# Patient Record
Sex: Male | Born: 1960 | Race: Black or African American | Hispanic: No | Marital: Married | Smoking: Current every day smoker
Health system: Southern US, Community
[De-identification: ages and names within clinical notes are randomized; demographics above are authoritative.]

## PROBLEM LIST (undated history)

## (undated) DIAGNOSIS — J869 Pyothorax without fistula: Secondary | ICD-10-CM

## (undated) DIAGNOSIS — K219 Gastro-esophageal reflux disease without esophagitis: Secondary | ICD-10-CM

## (undated) HISTORY — PX: NO PAST SURGERIES: SHX2092

## (undated) NOTE — *Deleted (*Deleted)
Shared service with APP.  I have personally seen and examined the patient, providing direct face to face care.  Physical exam findings and plan include patient presents with general weakness, decreased appetite recurrent cough despite being on antibiotics outpatient.  Patient is also had thoughts of self-harm.  On exam patient appears weak, deconditioned, lungs overall clear, abdomen soft nontender.  X-ray shows recurrent scarring type picture, plan for blood work, fluids as needed, CT scan for further delineation.  Sodium returned significantly low and patient will need admission for further work-up. No diagnosis found.

---

## 2016-12-07 ENCOUNTER — Ambulatory Visit (HOSPITAL_COMMUNITY)
Admission: RE | Admit: 2016-12-07 | Discharge: 2016-12-07 | Disposition: A | Discharge status: Home / Self Care | Payer: PRIVATE HEALTH INSURANCE | Source: Ambulatory Visit | Attending: Family Medicine | Admitting: Family Medicine

## 2016-12-07 ENCOUNTER — Other Ambulatory Visit (HOSPITAL_COMMUNITY): Payer: Self-pay | Admitting: Family Medicine

## 2016-12-07 DIAGNOSIS — R042 Hemoptysis: Secondary | ICD-10-CM | POA: Insufficient documentation

## 2016-12-07 DIAGNOSIS — R05 Cough: Secondary | ICD-10-CM | POA: Diagnosis present

## 2016-12-07 DIAGNOSIS — R059 Cough, unspecified: Secondary | ICD-10-CM

## 2016-12-07 DIAGNOSIS — R918 Other nonspecific abnormal finding of lung field: Secondary | ICD-10-CM | POA: Insufficient documentation

## 2017-09-01 ENCOUNTER — Other Ambulatory Visit: Payer: Self-pay | Admitting: Orthopedic Surgery

## 2017-09-03 ENCOUNTER — Other Ambulatory Visit (HOSPITAL_COMMUNITY): Payer: Self-pay | Admitting: Adult Health

## 2017-09-03 DIAGNOSIS — R634 Abnormal weight loss: Secondary | ICD-10-CM

## 2017-09-03 DIAGNOSIS — R5382 Chronic fatigue, unspecified: Secondary | ICD-10-CM

## 2017-09-03 DIAGNOSIS — R042 Hemoptysis: Secondary | ICD-10-CM

## 2017-09-17 ENCOUNTER — Ambulatory Visit (HOSPITAL_COMMUNITY): Payer: PRIVATE HEALTH INSURANCE

## 2017-10-01 ENCOUNTER — Ambulatory Visit (HOSPITAL_COMMUNITY): Admission: RE | Admit: 2017-10-01 | Payer: PRIVATE HEALTH INSURANCE | Source: Ambulatory Visit

## 2017-12-21 ENCOUNTER — Other Ambulatory Visit (HOSPITAL_COMMUNITY): Payer: Self-pay | Admitting: Family Medicine

## 2017-12-21 ENCOUNTER — Ambulatory Visit (HOSPITAL_COMMUNITY)
Admission: RE | Admit: 2017-12-21 | Discharge: 2017-12-21 | Disposition: A | Discharge status: Home / Self Care | Payer: PRIVATE HEALTH INSURANCE | Source: Ambulatory Visit | Attending: Family Medicine | Admitting: Family Medicine

## 2017-12-21 DIAGNOSIS — R918 Other nonspecific abnormal finding of lung field: Secondary | ICD-10-CM | POA: Diagnosis not present

## 2017-12-21 DIAGNOSIS — R042 Hemoptysis: Secondary | ICD-10-CM

## 2017-12-22 ENCOUNTER — Other Ambulatory Visit (HOSPITAL_COMMUNITY): Payer: Self-pay | Admitting: Family Medicine

## 2017-12-22 DIAGNOSIS — R042 Hemoptysis: Secondary | ICD-10-CM

## 2018-01-11 ENCOUNTER — Ambulatory Visit (HOSPITAL_COMMUNITY): Payer: PRIVATE HEALTH INSURANCE

## 2018-01-12 ENCOUNTER — Ambulatory Visit (HOSPITAL_COMMUNITY): Payer: PRIVATE HEALTH INSURANCE

## 2018-03-03 ENCOUNTER — Ambulatory Visit (INDEPENDENT_AMBULATORY_CARE_PROVIDER_SITE_OTHER): Payer: PRIVATE HEALTH INSURANCE | Admitting: Physician Assistant

## 2018-03-03 ENCOUNTER — Ambulatory Visit: Payer: PRIVATE HEALTH INSURANCE | Admitting: Physician Assistant

## 2018-03-03 ENCOUNTER — Encounter: Payer: Self-pay | Admitting: Physician Assistant

## 2018-03-03 ENCOUNTER — Inpatient Hospital Stay (HOSPITAL_COMMUNITY)
Admission: EM | Admit: 2018-03-03 | Discharge: 2018-03-04 | DRG: 178 | Discharge status: Against Medical Advice | Payer: No Typology Code available for payment source | Source: Ambulatory Visit | Attending: Internal Medicine | Admitting: Internal Medicine

## 2018-03-03 ENCOUNTER — Telehealth: Payer: Self-pay

## 2018-03-03 ENCOUNTER — Encounter (HOSPITAL_COMMUNITY): Payer: Self-pay

## 2018-03-03 ENCOUNTER — Other Ambulatory Visit: Payer: Self-pay

## 2018-03-03 ENCOUNTER — Ambulatory Visit
Admission: RE | Admit: 2018-03-03 | Discharge: 2018-03-03 | Disposition: A | Discharge status: Home / Self Care | Payer: PRIVATE HEALTH INSURANCE | Source: Ambulatory Visit | Attending: Physician Assistant | Admitting: Physician Assistant

## 2018-03-03 VITALS — BP 100/62 | HR 87 | Temp 97.8°F | Resp 16 | Ht 68.5 in | Wt 139.0 lb

## 2018-03-03 DIAGNOSIS — E871 Hypo-osmolality and hyponatremia: Secondary | ICD-10-CM | POA: Diagnosis present

## 2018-03-03 DIAGNOSIS — I2510 Atherosclerotic heart disease of native coronary artery without angina pectoris: Secondary | ICD-10-CM | POA: Diagnosis present

## 2018-03-03 DIAGNOSIS — F17210 Nicotine dependence, cigarettes, uncomplicated: Secondary | ICD-10-CM

## 2018-03-03 DIAGNOSIS — Z720 Tobacco use: Secondary | ICD-10-CM

## 2018-03-03 DIAGNOSIS — J869 Pyothorax without fistula: Principal | ICD-10-CM | POA: Diagnosis present

## 2018-03-03 DIAGNOSIS — D75839 Thrombocytosis, unspecified: Secondary | ICD-10-CM

## 2018-03-03 DIAGNOSIS — R9389 Abnormal findings on diagnostic imaging of other specified body structures: Secondary | ICD-10-CM

## 2018-03-03 DIAGNOSIS — R911 Solitary pulmonary nodule: Secondary | ICD-10-CM | POA: Diagnosis present

## 2018-03-03 DIAGNOSIS — R042 Hemoptysis: Secondary | ICD-10-CM | POA: Diagnosis present

## 2018-03-03 DIAGNOSIS — Z820 Family history of epilepsy and other diseases of the nervous system: Secondary | ICD-10-CM

## 2018-03-03 DIAGNOSIS — R634 Abnormal weight loss: Secondary | ICD-10-CM | POA: Diagnosis present

## 2018-03-03 DIAGNOSIS — D473 Essential (hemorrhagic) thrombocythemia: Secondary | ICD-10-CM | POA: Diagnosis present

## 2018-03-03 DIAGNOSIS — I700 Atherosclerosis of aorta: Secondary | ICD-10-CM | POA: Diagnosis present

## 2018-03-03 DIAGNOSIS — Z6820 Body mass index (BMI) 20.0-20.9, adult: Secondary | ICD-10-CM

## 2018-03-03 DIAGNOSIS — J90 Pleural effusion, not elsewhere classified: Secondary | ICD-10-CM | POA: Diagnosis present

## 2018-03-03 HISTORY — DX: Pyothorax without fistula: J86.9

## 2018-03-03 HISTORY — DX: Gastro-esophageal reflux disease without esophagitis: K21.9

## 2018-03-03 LAB — BASIC METABOLIC PANEL
BUN: 9 mg/dL (ref 6–23)
CHLORIDE: 89 meq/L — AB (ref 96–112)
CO2: 29 mEq/L (ref 19–32)
CREATININE: 0.74 mg/dL (ref 0.40–1.50)
Calcium: 8.5 mg/dL (ref 8.4–10.5)
GFR: 140.01 mL/min (ref >60.00)
Glucose, Bld: 95 mg/dL (ref 70–99)
POTASSIUM: 4.7 meq/L (ref 3.5–5.1)
Sodium: 126 mEq/L — ABNORMAL LOW (ref 135–145)

## 2018-03-03 LAB — CBC WITH DIFFERENTIAL/PLATELET
BASOS ABS: 0.2 10*3/uL — AB (ref 0.0–0.1)
Basophils Relative: 1.1 % (ref 0.0–3.0)
EOS ABS: 0.1 10*3/uL (ref 0.0–0.7)
Eosinophils Relative: 0.3 % (ref 0.0–5.0)
HEMATOCRIT: 46.4 % (ref 39.0–52.0)
Hemoglobin: 15.2 g/dL (ref 13.0–17.0)
LYMPHS ABS: 0.7 10*3/uL (ref 0.7–4.0)
LYMPHS PCT: 4.4 % — AB (ref 12.0–46.0)
MCHC: 32.7 g/dL (ref 30.0–36.0)
MCV: 89 fl (ref 78.0–100.0)
Monocytes Absolute: 1.6 10*3/uL — ABNORMAL HIGH (ref 0.1–1.0)
Monocytes Relative: 10.1 % (ref 3.0–12.0)
NEUTROS ABS: 13.6 10*3/uL — AB (ref 1.4–7.7)
NEUTROS PCT: 84.1 % — AB (ref 43.0–77.0)
PLATELETS: 561 10*3/uL — AB (ref 150.0–400.0)
RBC: 5.22 Mil/uL (ref 4.22–5.81)
RDW: 15.2 % (ref 11.5–15.5)
WBC: 16.2 10*3/uL — ABNORMAL HIGH (ref 4.0–10.5)

## 2018-03-03 LAB — I-STAT CG4 LACTIC ACID, ED
Lactic Acid, Venous: 2.07 mmol/L (ref 0.5–1.9)
Lactic Acid, Venous: 2.24 mmol/L (ref 0.5–1.9)

## 2018-03-03 MED ORDER — ENOXAPARIN SODIUM 40 MG/0.4ML ~~LOC~~ SOLN
40.0000 mg | SUBCUTANEOUS | Status: DC
  Administered 2018-03-03: 40 mg via SUBCUTANEOUS
  Filled 2018-03-03: qty 0.4

## 2018-03-03 MED ORDER — VANCOMYCIN HCL 10 G IV SOLR
1250.0000 mg | Freq: Once | INTRAVENOUS | Status: AC
  Administered 2018-03-03: 1250 mg via INTRAVENOUS
  Filled 2018-03-03: qty 1250

## 2018-03-03 MED ORDER — PIPERACILLIN-TAZOBACTAM 3.375 G IVPB 30 MIN
3.3750 g | Freq: Once | INTRAVENOUS | Status: AC
  Administered 2018-03-03: 3.375 g via INTRAVENOUS
  Filled 2018-03-03: qty 50

## 2018-03-03 MED ORDER — SODIUM CHLORIDE 0.9 % IV BOLUS
1000.0000 mL | Freq: Once | INTRAVENOUS | Status: AC
  Administered 2018-03-03: 1000 mL via INTRAVENOUS

## 2018-03-03 MED ORDER — PIPERACILLIN-TAZOBACTAM 3.375 G IVPB
3.3750 g | Freq: Three times a day (TID) | INTRAVENOUS | Status: DC
  Administered 2018-03-03 – 2018-03-04 (×2): 3.375 g via INTRAVENOUS
  Filled 2018-03-03 (×3): qty 50

## 2018-03-03 MED ORDER — IOPAMIDOL (ISOVUE-300) INJECTION 61%
75.0000 mL | Freq: Once | INTRAVENOUS | Status: AC | PRN
  Administered 2018-03-03: 75 mL via INTRAVENOUS

## 2018-03-03 MED ORDER — ENSURE ENLIVE PO LIQD: 237.0000 mL | Freq: Two times a day (BID) | ORAL | Status: DC

## 2018-03-03 NOTE — ED Triage Notes (Signed)
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 Also c/o fatigue.

## 2018-03-03 NOTE — ED Provider Notes (Signed)
Patient placed in Quick Look pathway, seen and evaluated   Chief Complaint: Hemoptysis  HPI:   Patient presents for evaluation of hemoptysis, weight loss, sweats, and cough for the past year.  Patient had a CT of the chest which showed an empyema.  His doctor sent him here for evaluation for TB.  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  He has had associated fatigue.  He smokes a pack a day.  Patient does note that earlier this year, he reports not eating for a month when his significant other was in a coma.  ROS: Hemoptysis, fatigue, weight loss, night sweats, cough  Physical Exam:   Gen: No distress, thin  Neuro: Awake and Alert  Skin: Warm    Focused Exam: Rales noted bilaterally, worse in the right than left; heart normal rate and rhythm, abdomen soft nontender  CBC, BMP completed today prior to arrival.  Lactic added.  Initiation of care has begun. The patient has been counseled on the process, plan, and necessity for staying for the completion/evaluation, and the remainder of the medical screening examination    Frederica Kuster, PA-C 03/03/18 [place], [place], MD 03/05/18 1554

## 2018-03-03 NOTE — ED Notes (Signed)
ED Provider at bedside. 

## 2018-03-03 NOTE — Telephone Encounter (Signed)
Cardiologist would like for the PCP to handle this medication for the patient.

## 2018-03-03 NOTE — Telephone Encounter (Signed)
Avril with [facility] called.STAT CT is complete and in Epic. Spoke with Charlena Cross in the practice and she will let provider know.

## 2018-03-03 NOTE — Progress Notes (Signed)
Patient presents to clinic today to establish care. Patient and wife have significant concern today as they note patient has been dealing with worsening fatigue over the past year associated with weight loss of > 50 pounds and intermittent productive cough with hemoptysis. Denies noted fever. Denies chest pain or SOB. Denies nausea or vomiting. Is a smoker with a > 30 pack-year smoking history. Patient endorses seeing his more recent PCP in July concerning this issue. Was sent for x-ray which we was told looked abnormal. As such he was to have a CT scan performed. States that they went to have done but were unable to do so due to an error in the order. States the imaging department and he and wife had contact PCP regarding this but were having a hard time getting a response. They decided to get a second opinion. Patient notes fatigue is so significant he has a hard time eating sometimes because he is so exhausted. Denies any foreign travel. Denies history of known chronic lung disease.   History reviewed. No pertinent past medical history.  Past Surgical History:  Procedure Laterality Date  . NO PAST SURGERIES      No current outpatient medications on file prior to visit.   No current facility-administered medications on file prior to visit.     No Known Allergies  Family History  Problem Relation Age of Onset  . Alzheimer's disease Mother   . Alzheimer's disease Father     Social History   Socioeconomic History  . Marital status: Married    Spouse name: Not on file  . Number of children: Not on file  . Years of education: Not on file  . Highest education level: Not on file  Occupational History  . Not on file  Social Needs  . Financial resource strain: Not on file  . Food insecurity:    Worry: Not on file    Inability: Not on file  . Transportation needs:    Medical: Not on file    Non-medical: Not on file  Tobacco Use  . Smoking status: Current Every Day Smoker   Packs/day: 1.00    Years: 45.00    Pack years: 45.00    Types: Cigarettes  . Smokeless tobacco: Never Used  Substance and Sexual Activity  . Alcohol use: Not on file  . Drug use: Not on file  . Sexual activity: Yes  Lifestyle  . Physical activity:    Days per week: Not on file    Minutes per session: Not on file  . Stress: Not on file  Relationships  . Social connections:    Talks on phone: Not on file    Gets together: Not on file    Attends religious service: Not on file    Active member of club or organization: Not on file    Attends meetings of clubs or organizations: Not on file    Relationship status: Not on file  . Intimate partner violence:    Fear of current or ex partner: Not on file    Emotionally abused: Not on file    Physically abused: Not on file    Forced sexual activity: Not on file  Other Topics Concern  . Not on file  Social History Narrative  . Not on file    Review of Systems  Constitutional: Positive for chills, malaise/fatigue and weight loss. Negative for fever.  Eyes: Negative for blurred vision and double vision.  Respiratory: Positive for cough, hemoptysis  and sputum production. Negative for shortness of breath and wheezing.   Cardiovascular: Negative for chest pain and palpitations.  Gastrointestinal: Negative.   Genitourinary: Negative.    BP 100/62   Pulse 87   Temp 97.8 F (36.6 C) (Oral)   Resp 16   Ht 5' 8.5" (1.74 m)   Wt 139 lb (63 kg)   SpO2 98%   BMI 20.83 kg/m   Physical Exam  Constitutional: He is oriented to person, place, and time. No distress.  Cachetic appearance  HENT:  Head: Normocephalic and atraumatic.  Right Ear: External ear normal.  Left Ear: External ear normal.  Nose: Nose normal.  Mouth/Throat: Oropharynx is clear and moist. No oropharyngeal exudate.  Eyes: Pupils are equal, round, and reactive to light. Conjunctivae are normal.  Neck: Neck supple.  Cardiovascular: Normal rate, regular rhythm, normal  heart sounds and intact distal pulses.  Pulmonary/Chest: Effort normal and breath sounds normal. No stridor. No respiratory distress. He has no wheezes. He has no rales. He exhibits no tenderness.  Lymphadenopathy:    He has no cervical adenopathy.  Neurological: He is alert and oriented to person, place, and time. No cranial nerve deficit.  Skin: He is not diaphoretic.  Psychiatric: He has a normal mood and affect.    Recent Results (from the past 2160 hour(s))  Basic metabolic panel     Status: Abnormal   Collection Time: 03/03/18 11:07 AM  Result Value Ref Range   Sodium 126 (L) 135 - 145 mEq/L   Potassium 4.7 3.5 - 5.1 mEq/L   Chloride 89 (L) 96 - 112 mEq/L   CO2 29 19 - 32 mEq/L   Glucose, Bld 95 70 - 99 mg/dL   BUN 9 6 - 23 mg/dL   Creatinine, Ser 0.74 0.40 - 1.50 mg/dL   Calcium 8.5 8.4 - 10.5 mg/dL   GFR 140.01 >60.00 mL/min  CBC w/Diff     Status: Abnormal   Collection Time: 03/03/18 11:07 AM  Result Value Ref Range   WBC 16.2 (H) 4.0 - 10.5 K/uL   RBC 5.22 4.22 - 5.81 Mil/uL   Hemoglobin 15.2 13.0 - 17.0 g/dL   HCT 46.4 39.0 - 52.0 %   MCV 89.0 78.0 - 100.0 fl   MCHC 32.7 30.0 - 36.0 g/dL   RDW 15.2 11.5 - 15.5 %   Platelets 561.0 (H) 150.0 - 400.0 K/uL   Neutrophils Relative % 84.1 (H) 43.0 - 77.0 %   Lymphocytes Relative 4.4 (L) 12.0 - 46.0 %   Monocytes Relative 10.1 3.0 - 12.0 %   Eosinophils Relative 0.3 0.0 - 5.0 %   Basophils Relative 1.1 0.0 - 3.0 %   Neutro Abs 13.6 (H) 1.4 - 7.7 K/uL   Lymphs Abs 0.7 0.7 - 4.0 K/uL   Monocytes Absolute 1.6 (H) 0.1 - 1.0 K/uL   Eosinophils Absolute 0.1 0.0 - 0.7 K/uL   Basophils Absolute 0.2 (H) 0.0 - 0.1 K/uL    Assessment/Plan: 1. Hemoptysis 2. Abnormal finding on chest xray In patient with significant smoking history. No known risk factors for TB. Able to review CXR result in Epic from 12/21/17 revealing "significantly increased RUL opacity concerning for pneumonia or malignancy CT chest recommended". Highest  concern is for malignancy versus atypical infection. Will check labs today and order STAT CT Chest giving severity and duration of symptoms and concern for lack of follow-up if not addressed in timely fashion.   - Basic metabolic panel - CBC w/Diff -  CT Chest W Contrast; Future    Leeanne Rio, PA-C

## 2018-03-03 NOTE — ED Provider Notes (Signed)
[place] EMERGENCY DEPARTMENT Provider Note   CSN: [id number] Arrival date & time: 03/03/18  1615     History   Chief Complaint Chief Complaint  Patient presents with  . Hemoptysis    HPI Derrick Beltran is a 57 y.o. male.  57 yo M with a chief complaint of weakness.  This been going on for the past week.  The patient feels very fatigued and rundown and is having trouble getting anything done.  Felt too bad to work this week.  He feels cold but when he touches his body it feels hot.  He denies fevers or chills.  He has been feeling bad for the past year.  He has had intermittent hemoptysis and about a 50 pound weight loss.  He went to a new family physician today and they obtained a chest x-ray that was concerning and so they obtained a CT scan with contrast of the chest that showed a complex fluid collection in the right lung concerning for an empyema.  He was then sent here for evaluation.  The history is provided by the patient and the spouse.  Illness  This is a new problem. The current episode started more than 1 week ago. The problem occurs constantly. The problem has been gradually worsening. Associated symptoms include shortness of breath. Pertinent negatives include no chest pain, no abdominal pain and no headaches. Nothing aggravates the symptoms. Nothing relieves the symptoms. He has tried nothing for the symptoms. The treatment provided no relief.    History reviewed. No pertinent past medical history.  Patient Active Problem List   Diagnosis Date Noted  . Empyema ([place]) 03/03/2018    Past Surgical History:  Procedure Laterality Date  . NO PAST SURGERIES          Home Medications    Prior to Admission medications   Medication Sig Start Date End Date Taking? Authorizing Provider  hydrocortisone cream 1 % Place 1 application rectally as needed for itching.  02/21/18  Yes [provider]  naproxen sodium (ALEVE) 220 MG tablet Take 960 mg  by mouth daily as needed (pain.).    Yes [provider]    Family History Family History  Problem Relation Age of Onset  . Alzheimer's disease Mother   . Alzheimer's disease Father     Social History Social History   Tobacco Use  . Smoking status: Current Every Day Smoker    Packs/day: 1.00    Years: 45.00    Pack years: 45.00    Types: Cigarettes  . Smokeless tobacco: Never Used  Substance Use Topics  . Alcohol use: Yes    Alcohol/week: 30.0 standard drinks    Types: 30 Cans of beer per week    Frequency: Never    Comment: 3-4 / day and more on weekends   . Drug use: Never     Allergies   Patient has no known allergies.   Review of Systems Review of Systems  Constitutional: Negative for chills and fever.  HENT: Negative for congestion and facial swelling.   Eyes: Negative for discharge and visual disturbance.  Respiratory: Positive for cough and shortness of breath.   Cardiovascular: Negative for chest pain and palpitations.  Gastrointestinal: Negative for abdominal pain, diarrhea and vomiting.  Musculoskeletal: Negative for arthralgias and myalgias.  Skin: Negative for color change and rash.  Neurological: Negative for tremors, syncope and headaches.  Psychiatric/Behavioral: Negative for confusion and dysphoric mood.     Physical Exam  Updated Vital Signs BP 109/73   Pulse 92   Temp 98.6 F (37 C) (Oral)   Resp 16   Ht 5\' 8"  (1.727 m)   Wt 61.7 kg   SpO2 97%   BMI 20.68 kg/m   Physical Exam  Constitutional: He is oriented to person, place, and time. He appears well-developed and well-nourished.  Chronically ill appearing  HENT:  Head: Normocephalic and atraumatic.  Eyes: Pupils are equal, round, and reactive to light. EOM are normal.  Neck: Normal range of motion. Neck supple. No JVD present.  Cardiovascular: Normal rate and regular rhythm. Exam reveals no gallop and no friction rub.  No murmur heard. Pulmonary/Chest: No respiratory  distress. He has no wheezes.  Diminished breath sounds on the Right  Abdominal: He exhibits no distension. There is no rebound and no guarding.  Musculoskeletal: Normal range of motion.  Neurological: He is alert and oriented to person, place, and time.  Skin: No rash noted. No pallor.  Psychiatric: He has a normal mood and affect. His behavior is normal.  Nursing note and vitals reviewed.    ED Treatments / Results  Labs (all labs ordered are listed, but only abnormal results are displayed) Labs Reviewed  I-STAT CG4 LACTIC ACID, ED - Abnormal; Notable for the following components:      Result Value   Lactic Acid, Venous 2.07 (*)    All other components within normal limits  CULTURE, BLOOD (ROUTINE X 2)  CULTURE, BLOOD (ROUTINE X 2)  EXPECTORATED SPUTUM ASSESSMENT W REFEX TO RESP CULTURE  I-STAT CG4 LACTIC ACID, ED    EKG None  Radiology Ct Chest W Contrast  Result Date: 03/03/2018 CLINICAL DATA:  57 year old male with history of ill feeling for 1 week. Hemoptysis. Cough. Fevers. Forty-five pack-year history of smoking. EXAM: CT CHEST WITH CONTRAST TECHNIQUE: Multidetector CT imaging of the chest was performed during intravenous contrast administration. CONTRAST:  24mL ISOVUE-300 IOPAMIDOL (ISOVUE-300) INJECTION 61% COMPARISON:  No priors. FINDINGS: Cardiovascular: Heart size is normal. There is no significant pericardial fluid, thickening or pericardial calcification. There is aortic atherosclerosis, as well as atherosclerosis of the great vessels of the mediastinum and the coronary arteries, including calcified atherosclerotic plaque in the left main, left anterior descending, left circumflex and right coronary arteries. Mediastinum/Nodes: Multiple prominent borderline enlarged mediastinal lymph nodes measuring up to 12 mm in short axis in the right paratracheal nodal station. Mildly enlarged subcarinal lymph node measuring 16 mm in short axis. Esophagus is unremarkable in  appearance. No axillary lymphadenopathy. Lungs/Pleura: Large right pleural effusion located predominantly in the sub pulmonic region with extensive pleural thickening and enhancement, suspicious for empyema. Widespread airspace consolidation throughout the right lung, with extensive cylindrical and varicose bronchiectasis, as well as thickening of the peribronchovascular interstitium and chronic volume loss. Several cystic appearing spaces are also noted within the right lung, which may represent areas of cystic bronchiectasis or may represent small cavities. Adjacent to the right lung in the mid to upper right hemithorax there is also a small amount of pleural fluid, extensive pleural thickening and enhancement, and a small amount of gas throughout the pleural space which appears predominantly loculated. In the periphery of the left upper lobe there is a 1.3 x 0.9 cm nodule with macrolobulated margins (axial image 98 of series 3). Left lung is otherwise clear. No left pleural effusion. Upper Abdomen: Aortic atherosclerosis. Musculoskeletal: There are no aggressive appearing lytic or blastic lesions noted in the visualized portions of the skeleton. IMPRESSION: 1.  Extensive chronic infectious changes throughout the right lung with widespread areas of bronchiectasis and post infectious scarring, with large complex right-sided hydropneumothorax which has an appearance most suggestive of a highly loculated empyema. 2. 1.3 x 0.9 cm nodule in the left upper lobe (axial image 98 of series 3). Given the findings in the right lung, this may simply be infectious or inflammatory in etiology, however, close attention on follow-up studies is recommended. Repeat chest CT is recommended in 3 months to further evaluate this lesion. This recommendation follows the consensus statement: Guidelines for Management of Incidental Pulmonary Nodules Detected on CT Images: From the Fleischner Society 2017; Radiology 2017; 284:228-243. 3.  Aortic atherosclerosis, in addition to left main and 3 vessel coronary artery disease. Please note that although the presence of coronary artery calcium documents the presence of coronary artery disease, the severity of this disease and any potential stenosis cannot be assessed on this non-gated CT examination. Assessment for potential risk factor modification, dietary therapy or pharmacologic therapy may be warranted, if clinically indicated. These results will be called to the ordering clinician or representative by the Radiologist Assistant, and communication documented in the PACS or zVision Dashboard. Aortic Atherosclerosis (ICD10-I70.0). Electronically Signed   By: Vinnie Langton M.D.   On: 03/03/2018 14:14    Procedures Procedures (including critical care time)  Medications Ordered in ED Medications  vancomycin (VANCOCIN) 1,250 mg in sodium chloride 0.9 % 250 mL IVPB (has no administration in time range)  piperacillin-tazobactam (ZOSYN) IVPB 3.375 g (3.375 g Intravenous New Bag/Given 03/03/18 1757)     Initial Impression / Assessment and Plan / ED Course  I have reviewed the triage vital signs and the nursing notes.  Pertinent labs & imaging results that were available during my care of the patient were reviewed by me and considered in my medical decision making (see chart for details).     57 yo M with a chief complaint of hemoptysis.  This been going on for the past year with associated weight loss.  Had worsening weakness over the past week.  Describing some fevers but did not ever take his temperature.  The patient had an outpatient CT scan that was concerning for empyema.  I discussed the case with Dr. Roxan Hockey, cardiothoracic surgery.  Recommended medical admission.  The patients results and plan were reviewed and discussed.   Any x-rays performed were independently reviewed by myself.   Differential diagnosis were considered with the presenting HPI.  Medications    vancomycin (VANCOCIN) 1,250 mg in sodium chloride 0.9 % 250 mL IVPB (has no administration in time range)  piperacillin-tazobactam (ZOSYN) IVPB 3.375 g (3.375 g Intravenous New Bag/Given 03/03/18 1757)    Vitals:   03/03/18 1624 03/03/18 1625 03/03/18 1752  BP:  115/76 109/73  Pulse:  87 92  Resp:  18 16  Temp:  98.6 F (37 C)   TempSrc:  Oral   SpO2:  96% 97%  Weight: 61.7 kg    Height: 5\' 8"  (1.727 m)      Final diagnoses:  Empyema lung (HCC)    Admission/ observation were discussed with the admitting physician, patient and/or family and they are comfortable with the plan.    Final Clinical Impressions(s) / ED Diagnoses   Final diagnoses:  Empyema lung [facility])    ED Discharge Orders    None       Deno Etienne, DO 03/03/18 1826

## 2018-03-03 NOTE — Consult Note (Signed)
Reason for Consult:?empyema Referring Physician: ED  Derrick Beltran is an 57 y.o. male.  HPI: 57 yo man with a history of tobacco abuse who presents with a cc/o weakness. Says he has been feeling "rough" for a few months. Over past week progressively more weak. He has been having cough and hemoptysis for the past year. May have had some chills, denies fever. Also 50 pound weight loss over past year. Went to a new doctor today. Had CXR done and was sent to ED. CT with contrast showed complex effusion.  Denies shortness of breath. Some vague chest discomfort.  History reviewed. No pertinent past medical history.  Denies history of TB  Past Surgical History:  Procedure Laterality Date  . NO PAST SURGERIES      Family History  Problem Relation Age of Onset  . Alzheimer's disease Mother   . Alzheimer's disease Father     Social History:  reports that he has been smoking cigarettes. He has a 45.00 pack-year smoking history. He has never used smokeless tobacco. He reports that he drinks about 30.0 standard drinks of alcohol per week. He reports that he does not use drugs.  Allergies: No Known Allergies  Medications: Prior to Admission:  (Not in a hospital admission)  Results for orders placed or performed during the hospital encounter of 03/03/18 (from the past 48 hour(s))  I-Stat CG4 Lactic Acid, ED     Status: Abnormal   Collection Time: 03/03/18  4:43 PM  Result Value Ref Range   Lactic Acid, Venous 2.07 (HH) 0.5 - 1.9 mmol/L   Comment NOTIFIED PHYSICIAN   I-Stat CG4 Lactic Acid, ED     Status: Abnormal   Collection Time: 03/03/18  6:26 PM  Result Value Ref Range   Lactic Acid, Venous 2.24 (HH) 0.5 - 1.9 mmol/L   Comment NOTIFIED PHYSICIAN     Ct Chest W Contrast  Result Date: 03/03/2018 CLINICAL DATA:  57 year old male with history of ill feeling for 1 week. Hemoptysis. Cough. Fevers. Forty-five pack-year history of smoking. EXAM: CT CHEST WITH CONTRAST TECHNIQUE: Multidetector  CT imaging of the chest was performed during intravenous contrast administration. CONTRAST:  57mL ISOVUE-300 IOPAMIDOL (ISOVUE-300) INJECTION 61% COMPARISON:  No priors. FINDINGS: Cardiovascular: Heart size is normal. There is no significant pericardial fluid, thickening or pericardial calcification. There is aortic atherosclerosis, as well as atherosclerosis of the great vessels of the mediastinum and the coronary arteries, including calcified atherosclerotic plaque in the left main, left anterior descending, left circumflex and right coronary arteries. Mediastinum/Nodes: Multiple prominent borderline enlarged mediastinal lymph nodes measuring up to 12 mm in short axis in the right paratracheal nodal station. Mildly enlarged subcarinal lymph node measuring 16 mm in short axis. Esophagus is unremarkable in appearance. No axillary lymphadenopathy. Lungs/Pleura: Large right pleural effusion located predominantly in the sub pulmonic region with extensive pleural thickening and enhancement, suspicious for empyema. Widespread airspace consolidation throughout the right lung, with extensive cylindrical and varicose bronchiectasis, as well as thickening of the peribronchovascular interstitium and chronic volume loss. Several cystic appearing spaces are also noted within the right lung, which may represent areas of cystic bronchiectasis or may represent small cavities. Adjacent to the right lung in the mid to upper right hemithorax there is also a small amount of pleural fluid, extensive pleural thickening and enhancement, and a small amount of gas throughout the pleural space which appears predominantly loculated. In the periphery of the left upper lobe there is a 1.3 x 0.9 cm nodule with  macrolobulated margins (axial image 98 of series 3). Left lung is otherwise clear. No left pleural effusion. Upper Abdomen: Aortic atherosclerosis. Musculoskeletal: There are no aggressive appearing lytic or blastic lesions noted in the  visualized portions of the skeleton. IMPRESSION: 1. Extensive chronic infectious changes throughout the right lung with widespread areas of bronchiectasis and post infectious scarring, with large complex right-sided hydropneumothorax which has an appearance most suggestive of a highly loculated empyema. 2. 1.3 x 0.9 cm nodule in the left upper lobe (axial image 98 of series 3). Given the findings in the right lung, this may simply be infectious or inflammatory in etiology, however, close attention on follow-up studies is recommended. Repeat chest CT is recommended in 3 months to further evaluate this lesion. This recommendation follows the consensus statement: Guidelines for Management of Incidental Pulmonary Nodules Detected on CT Images: From the Fleischner Society 2017; Radiology 2017; 284:228-243. 3. Aortic atherosclerosis, in addition to left main and 3 vessel coronary artery disease. Please note that although the presence of coronary artery calcium documents the presence of coronary artery disease, the severity of this disease and any potential stenosis cannot be assessed on this non-gated CT examination. Assessment for potential risk factor modification, dietary therapy or pharmacologic therapy may be warranted, if clinically indicated. These results will be called to the ordering clinician or representative by the Radiologist Assistant, and communication documented in the PACS or zVision Dashboard. Aortic Atherosclerosis (ICD10-I70.0). Electronically Signed   By: Vinnie Langton M.D.   On: 03/03/2018 14:14    Review of Systems  Constitutional: Positive for chills, diaphoresis, malaise/fatigue and weight loss. Negative for fever.  Respiratory: Positive for cough and hemoptysis. Negative for shortness of breath and wheezing.   Cardiovascular: Negative for chest pain and orthopnea.  Gastrointestinal: Negative for nausea and vomiting.   Blood pressure 109/73, pulse 92, temperature 98.6 F (37 C),  temperature source Oral, resp. rate 16, height 5\' 8"  (1.727 m), weight 61.7 kg, SpO2 97 %. Physical Exam  Vitals reviewed. Constitutional: He is oriented to person, place, and time. No distress.  57 yo man disheveled appearance  HENT:  Head: Normocephalic and atraumatic.  Mouth/Throat: No oropharyngeal exudate.  Eyes: EOM are normal. No scleral icterus.  Neck: No thyromegaly present.  Respiratory: Effort normal. No respiratory distress. He has no wheezes. He has no rales.  Absent BS on right  GI: Soft. He exhibits no distension. There is no tenderness.  Musculoskeletal: He exhibits no edema.  Lymphadenopathy:    He has no cervical adenopathy.  Neurological: He is alert and oriented to person, place, and time. No cranial nerve deficit. He exhibits normal muscle tone.  Skin: Skin is warm and dry. He is not diaphoretic.    Assessment/Plan: 23 yo man who presents with a 1 year history of cough, hemoptysis, and weight loss and now a 1 week history of feeling profoundly weak.  Ct chest shows a destroyed upper lobe, which was present on CXR over a year ago and again in July of this year. There is a new basilar pleural effusion. Appearance of upper lobe with bronchiectasis and essentially a destroyed lobe is worrisome for TB or fungal process.  I think the first step is to access the basilar effusion and drain it. Fluid can be sent for chemistries, cytology and cultures.  I recommend getting IR to place a catheter and consult Pulmonary for thrombolytics. They may also want to do a bronchoscopy at some point.  Will follow  Melrose Nakayama  03/03/2018, 6:38 PM

## 2018-03-03 NOTE — H&P (Signed)
History and Physical    Derrick Beltran KWI:[id number] DOB: [birth year] DOA: 03/03/2018  PCP: [name], PA-C Patient coming from: [place] office  Chief Complaint: Lethargy, hemoptysis, weight loss  HPI: Derrick Beltran is a 57 y.o. male with medical history significant of tobacco use being sent to the hospital by his doctor's office for further evaluation after he had an abnormal chest x-ray.  Patient states he has been feeling very lethargic for the past 8 to 10 days.  Denies having any fevers or chills.  States he has been having hemoptysis and has lost 100 pounds over the past 1 year.  States his appetite is low.  He works as a Games developer.  Denies recent travel or exposure to anyone with TB.  ED Course: Vitals stable on arrival.  Labs showing sodium 126, white count 16.2, and lactic acid 2.0 > 2.2.  Chest CT showing highly loculated empyema in the right lung and 1.3 x 0.9 cm nodule in the left upper lobe (infectious versus inflammatory).  Vancomycin and Zosyn given in the ED.  Patient was seen by Dr. Roxan Hockey (Cardiothoracic surgery) in the ED.  [facility] paged to admit.  Review of Systems: As per HPI otherwise 10 point review of systems negative.  Past Medical History:  Diagnosis Date  . Empyema ([place]) 03/03/2018  . GERD (gastroesophageal reflux disease)     Past Surgical History:  Procedure Laterality Date  . NO PAST SURGERIES       reports that he has been smoking cigarettes. He has a 45.00 pack-year smoking history. He has never used smokeless tobacco. He reports that he drinks about 30.0 standard drinks of alcohol per week. He reports that he does not use drugs.  No Known Allergies  Family History  Problem Relation Age of Onset  . Alzheimer's disease Mother   . Alzheimer's disease Father     Prior to Admission medications   Medication Sig Start Date End Date Taking? Authorizing Provider  hydrocortisone cream 1 % Place 1 application rectally as needed for itching.   02/21/18  Yes [provider]  naproxen sodium (ALEVE) 220 MG tablet Take 960 mg by mouth daily as needed (pain.).    Yes [provider]    Physical Exam: Vitals:   03/03/18 1830 03/03/18 1900 03/03/18 1930 03/03/18 2031  BP: 104/67 105/69 (!) 101/59 114/80  Pulse: 90 82 84 80  Resp:  (!) 21 (!) 21 (!) 25  Temp:    99.7 F (37.6 C)  TempSrc:    Oral  SpO2: 97% 96% 96% 97%  Weight:    62.5 kg  Height:    5\' 9"  (1.753 m)   Physical Exam  Constitutional: He is oriented to person, place, and time. No distress.  HENT:  Head: Normocephalic and atraumatic.  Eyes: Right eye exhibits no discharge. Left eye exhibits no discharge.  Neck: Neck supple. No tracheal deviation present.  Cardiovascular: Normal rate, regular rhythm and intact distal pulses.  Pulmonary/Chest: Effort normal and breath sounds normal. No respiratory distress. He has no wheezes. He has no rales.  Abdominal: Soft. Bowel sounds are normal. He exhibits no distension. There is no tenderness.  Musculoskeletal: He exhibits no edema.  Neurological: He is alert and oriented to person, place, and time.  Skin: Skin is warm and dry. He is not diaphoretic.  Psychiatric: His behavior is normal.     Labs on Admission: I have personally reviewed following labs and imaging studies  CBC: Recent Labs  Lab 03/03/18 1107  WBC 16.2*  NEUTROABS 13.6*  HGB 15.2  HCT 46.4  MCV 89.0  PLT 161.0*   Basic Metabolic Panel: Recent Labs  Lab 03/03/18 1107  NA 126*  K 4.7  CL 89*  CO2 29  GLUCOSE 95  BUN 9  CREATININE 0.74  CALCIUM 8.5   GFR: Estimated Creatinine Clearance: 90.1 mL/min (by C-G formula based on SCr of 0.74 mg/dL). Liver Function Tests: No results for input(s): AST, ALT, ALKPHOS, BILITOT, PROT, ALBUMIN in the last 168 hours. No results for input(s): LIPASE, AMYLASE in the last 168 hours. No results for input(s): AMMONIA in the last 168 hours. Coagulation Profile: No results for input(s):  INR, PROTIME in the last 168 hours. Cardiac Enzymes: No results for input(s): CKTOTAL, CKMB, CKMBINDEX, TROPONINI in the last 168 hours. BNP (last 3 results) No results for input(s): PROBNP in the last 8760 hours. HbA1C: No results for input(s): HGBA1C in the last 72 hours. CBG: No results for input(s): GLUCAP in the last 168 hours. Lipid Profile: No results for input(s): CHOL, HDL, LDLCALC, TRIG, CHOLHDL, LDLDIRECT in the last 72 hours. Thyroid Function Tests: No results for input(s): TSH, T4TOTAL, FREET4, T3FREE, THYROIDAB in the last 72 hours. Anemia Panel: No results for input(s): VITAMINB12, FOLATE, FERRITIN, TIBC, IRON, RETICCTPCT in the last 72 hours. Urine analysis: No results found for: COLORURINE, APPEARANCEUR, LABSPEC, [place], GLUCOSEU, HGBUR, BILIRUBINUR, KETONESUR, PROTEINUR, UROBILINOGEN, NITRITE, LEUKOCYTESUR  Radiological Exams on Admission: Ct Chest W Contrast  Result Date: 03/03/2018 CLINICAL DATA:  57 year old male with history of ill feeling for 1 week. Hemoptysis. Cough. Fevers. Forty-five pack-year history of smoking. EXAM: CT CHEST WITH CONTRAST TECHNIQUE: Multidetector CT imaging of the chest was performed during intravenous contrast administration. CONTRAST:  46mL ISOVUE-300 IOPAMIDOL (ISOVUE-300) INJECTION 61% COMPARISON:  No priors. FINDINGS: Cardiovascular: Heart size is normal. There is no significant pericardial fluid, thickening or pericardial calcification. There is aortic atherosclerosis, as well as atherosclerosis of the great vessels of the mediastinum and the coronary arteries, including calcified atherosclerotic plaque in the left main, left anterior descending, left circumflex and right coronary arteries. Mediastinum/Nodes: Multiple prominent borderline enlarged mediastinal lymph nodes measuring up to 12 mm in short axis in the right paratracheal nodal station. Mildly enlarged subcarinal lymph node measuring 16 mm in short axis. Esophagus is unremarkable  in appearance. No axillary lymphadenopathy. Lungs/Pleura: Large right pleural effusion located predominantly in the sub pulmonic region with extensive pleural thickening and enhancement, suspicious for empyema. Widespread airspace consolidation throughout the right lung, with extensive cylindrical and varicose bronchiectasis, as well as thickening of the peribronchovascular interstitium and chronic volume loss. Several cystic appearing spaces are also noted within the right lung, which may represent areas of cystic bronchiectasis or may represent small cavities. Adjacent to the right lung in the mid to upper right hemithorax there is also a small amount of pleural fluid, extensive pleural thickening and enhancement, and a small amount of gas throughout the pleural space which appears predominantly loculated. In the periphery of the left upper lobe there is a 1.3 x 0.9 cm nodule with macrolobulated margins (axial image 98 of series 3). Left lung is otherwise clear. No left pleural effusion. Upper Abdomen: Aortic atherosclerosis. Musculoskeletal: There are no aggressive appearing lytic or blastic lesions noted in the visualized portions of the skeleton. IMPRESSION: 1. Extensive chronic infectious changes throughout the right lung with widespread areas of bronchiectasis and post infectious scarring, with large complex right-sided hydropneumothorax which has an appearance most suggestive of  a highly loculated empyema. 2. 1.3 x 0.9 cm nodule in the left upper lobe (axial image 98 of series 3). Given the findings in the right lung, this may simply be infectious or inflammatory in etiology, however, close attention on follow-up studies is recommended. Repeat chest CT is recommended in 3 months to further evaluate this lesion. This recommendation follows the consensus statement: Guidelines for Management of Incidental Pulmonary Nodules Detected on CT Images: From the Fleischner Society 2017; Radiology 2017; 284:228-243. 3.  Aortic atherosclerosis, in addition to left main and 3 vessel coronary artery disease. Please note that although the presence of coronary artery calcium documents the presence of coronary artery disease, the severity of this disease and any potential stenosis cannot be assessed on this non-gated CT examination. Assessment for potential risk factor modification, dietary therapy or pharmacologic therapy may be warranted, if clinically indicated. These results will be called to the ordering clinician or representative by the Radiologist Assistant, and communication documented in the PACS or zVision Dashboard. Aortic Atherosclerosis (ICD10-I70.0). Electronically Signed   By: Vinnie Langton M.D.   On: 03/03/2018 14:14    Assessment/Plan Principal Problem:   Empyema (HCC) Active Problems:   Thrombocytosis (HCC)   Hyponatremia   Pulmonary nodule   Aortic atherosclerosis (HCC)   Tobacco use   Empyema -1 year history of cough, hemoptysis, weight loss.  1 week history of severe fatigue. -Hemodynamically stable.  Labs showing white count 16.2. Lactic acid 2.0 > 2.2 > 0.5 with IV fluid. -CT showing highly loculated empyema in the right lung. -Patient was seen by Dr. Roxan Hockey from cardiothoracic surgery and his presentation is thought to be worrisome for TB versus fungal process.  He is recommending drainage of the basilar infusion by IR and consulting pulmonology for thrombolytics and bronchoscopy. -I spoke to Dr. Vernard Gambles, IR, will see the patient in the morning.  -Please consult pulmonology in the morning for thrombolytics after patient gets catheter placed by IR. -IV Zosyn -Sputum culture -Blood culture x2 -Airborne isolation, check QuantiFERON gold -Trend lactate -N.p.o. after midnight  Thrombocytosis Platelets 561,000.  No prior baseline in chart. -Repeat CBC in a.m.  Hyponatremia Sodium 126. Unclear if chronic as no prior labs in chart.  Patient received fluid bolus for elevated  lactate. -Recheck BMP in a.m.  Pulmonary nodule -CT showing 1.3 x 0.9 cm nodule in the left upper lobe (infectious versus inflammatory) -Patient will need a follow-up CT in 3 months  Aortic atherosclerosis -CT showing left main and three-vessel coronary artery disease. -Risk factor stratification: Discussed smoking cessation.  Check lipid panel and A1c. -He will need outpatient ischemic work-up  Tobacco use -Discussed smoking cessation  DVT prophylaxis: Lovenox Code Status: Patient wishes to be full code. Family Communication: No family present at bedside. Disposition Plan: Anticipate discharge to home in 2 to 3 days.  Consults called: Cardiothoracic surgery (Dr. Roxan Hockey), interventional radiology (Dr. Vernard Gambles) Admission status: Inpatient   Shela Leff MD [facility] Pager [phone]  If 7PM-7AM, please contact night-coverage [email or web address] Password [email or web address]  03/04/2018, 1:36 AM

## 2018-03-03 NOTE — Patient Instructions (Addendum)
Please go to the lab today for blood work.  I will call you with your results. We will alter treatment regimen(s) if indicated by your results.   Speak to Levada Dy up front who is going to schedule you for a CT scan ASAP.  Tylenol for pain. Can apply Salon Pas patches to the area to help with discomfort.   We will know more shortly and we can alter treatment regimen.

## 2018-03-04 DIAGNOSIS — E871 Hypo-osmolality and hyponatremia: Secondary | ICD-10-CM

## 2018-03-04 DIAGNOSIS — I700 Atherosclerosis of aorta: Secondary | ICD-10-CM

## 2018-03-04 DIAGNOSIS — Z720 Tobacco use: Secondary | ICD-10-CM

## 2018-03-04 DIAGNOSIS — D473 Essential (hemorrhagic) thrombocythemia: Secondary | ICD-10-CM

## 2018-03-04 DIAGNOSIS — F17210 Nicotine dependence, cigarettes, uncomplicated: Secondary | ICD-10-CM

## 2018-03-04 DIAGNOSIS — D75839 Thrombocytosis, unspecified: Secondary | ICD-10-CM

## 2018-03-04 DIAGNOSIS — R911 Solitary pulmonary nodule: Secondary | ICD-10-CM

## 2018-03-04 LAB — HIV ANTIBODY (ROUTINE TESTING W REFLEX): HIV SCREEN 4TH GENERATION: NONREACTIVE

## 2018-03-04 LAB — BASIC METABOLIC PANEL
ANION GAP: 9 (ref 5–15)
BUN: 5 mg/dL — ABNORMAL LOW (ref 6–20)
CALCIUM: 8.5 mg/dL — AB (ref 8.9–10.3)
CO2: 28 mmol/L (ref 22–32)
CREATININE: 0.83 mg/dL (ref 0.61–1.24)
Chloride: 94 mmol/L — ABNORMAL LOW (ref 98–111)
Glucose, Bld: 87 mg/dL (ref 70–99)
Potassium: 3.9 mmol/L (ref 3.5–5.1)
SODIUM: 131 mmol/L — AB (ref 135–145)

## 2018-03-04 LAB — CBC
HEMATOCRIT: 46.4 % (ref 39.0–52.0)
Hemoglobin: 14 g/dL (ref 13.0–17.0)
MCH: 27.2 pg (ref 26.0–34.0)
MCHC: 30.2 g/dL (ref 30.0–36.0)
MCV: 90.1 fL (ref 80.0–100.0)
NRBC: 0 % (ref 0.0–0.2)
PLATELETS: 551 10*3/uL — AB (ref 150–400)
RBC: 5.15 MIL/uL (ref 4.22–5.81)
RDW: 13.9 % (ref 11.5–15.5)
WBC: 13.4 10*3/uL — AB (ref 4.0–10.5)

## 2018-03-04 LAB — LIPID PANEL
Cholesterol: 87 mg/dL (ref 0–200)
HDL: 22 mg/dL — ABNORMAL LOW (ref >40)
LDL CALC: 51 mg/dL (ref 0–99)
Total CHOL/HDL Ratio: 4 RATIO
Triglycerides: 72 mg/dL (ref <150)
VLDL: 14 mg/dL (ref 0–40)

## 2018-03-04 LAB — PROTIME-INR
INR: 1.28
Prothrombin Time: 15.9 seconds — ABNORMAL HIGH (ref 11.4–15.2)

## 2018-03-04 LAB — LACTIC ACID, PLASMA: LACTIC ACID, VENOUS: 0.5 mmol/L (ref 0.5–1.9)

## 2018-03-04 LAB — HEMOGLOBIN A1C
HEMOGLOBIN A1C: 4.9 % (ref 4.8–5.6)
Mean Plasma Glucose: 93.93 mg/dL

## 2018-03-04 MED ORDER — ENOXAPARIN SODIUM 40 MG/0.4ML ~~LOC~~ SOLN: 40.0000 mg | SUBCUTANEOUS | Status: DC

## 2018-03-04 NOTE — Discharge Summary (Addendum)
[facility] TEAM 1 - Stepdown/ICU TEAM  PT LEFT AMA SUMMARY  Lorcan Shelp MRN - [id number] DOB - Jul 11, 1960  Date of Admission - 03/03/2018 Date LEFT AMA: 03/04/2018  Attending Physician:  Joette Catching T  Patient's PCP:  [name], PA-C  Disposition: LEFT AMA  Follow-up Appts:  Not able to be arranged or discussed as pt LEFT AMA  Diagnoses at time pt LEFT AMA: R lung upper lobe destructive process - Empyema / complex effusion - worrisome for TB or fungal process  Thrombocytosis  Hyponatremia  Pulmonary nodule Aortic atherosclerosis Tobacco use  Initial presentation: 57 y.o.malewith a hx of tobacco use who was sent to the hospital by his doctor's office for an abnormal chest x-ray.He had been feeling lethargic for 8 to 10 days but denied fevers or chills. ROS revealed intermittent hemoptysis and a 100 pound weight loss over 1 year. He denied recent travel or exposure to anyone with TB. He works as a Games developer.   In the ED Chest CT showed highly loculated empyema in the right lung and1.3 x 0.9 cm nodule in the left upper lobe (infectious versus inflammatory).  Hospital Course: The patient was admitted to the acute hospital after he presented with the above listed symptoms.  As noted above work-up revealed a profound process in the right lung.  The exact nature of this process was not clear.  The differential included tuberculosis, fungal infection, atypical profound bacterial infection, or even malignant process.  The patient was seen in consultation by T[facility]. The initial step of the evaluation was to include placement of a pigtail catheter to allow drainage of pleural fluid for further testing.  It was appreciated that further work-up would likely be required.  Before the pleural drain could be placed the patient informed his nurse that he wished to leave AMA.  The dictating MD presented to the room and had an extensive discussion with the patient and his wife.  I  showed the patient the images of his CT scan, which were profoundly abnormal.  He appeared to understand how severe the process was in his right lung.  I explained to him our concern that without treatment that this process will almost assuredly worsen and could involve his left lung and would then lead to his untimely death.  He persisted in his desire to leave.  I offered to simplify the process and obtain simply a thoracentesis without a catheter remaining but this did not sway him and he continued to insist that he be allowed to leave the hospital.  I explained to him that I was hopeful that he had a treatable condition and that I felt that his decision to leave the hospital would lead to his unnecessary early death.  He voiced understanding but again insisted that he was leaving no matter the outcome.  His wife pleaded with him to no avail.  Given the concern that the patient's pulmonary process could represent TB I called and spoke with the on-call ID Dr. for the day.  He suggested that I contact the health department for further recommendations.  I called the health department and received a voicemail explaining to me that Ms. Tammy Faucette, apparently the TB nurse, was not available until next week.  I called again and attempted to reach the TB nurse on call but repeatedly was forwarded to medical records voicemail.  I will make other attempts to contact the health department for further advice.    Medication List  Unable to be finalized as pt LEFT AMA  Day of Discharge Wt Readings from Last 3 Encounters:  03/03/18 62.5 kg  03/03/18 63 kg   Temp Readings from Last 3 Encounters:  03/04/18 98 F (36.7 C) (Oral)  03/03/18 97.8 F (36.6 C) (Oral)   BP Readings from Last 3 Encounters:  03/04/18 (!) 105/59  03/03/18 100/62   Pulse Readings from Last 3 Encounters:  03/04/18 61  03/03/18 87    Physical Exam: Exam not able to be completed at time of d/c as pt LEFT AMA  12:21  PM 03/04/18  Cherene Altes, MD [facility] Office  [phone] Pager [phone]  On-Call/Text Page:      [email or web address]      password [facility]

## 2018-03-04 NOTE — Progress Notes (Signed)
Patient left AMA. Patient was upset about getting a chest tube and he wanted to go outside and smoke a cigarette. I explained to [name] that we could not allow him to go outside and smoke a cigarette. I told him that he was on airborne precautions until we could rule out Tuberculosis which is a serious condition. He stated that he would not be told that he could not smoke a cigarette. He also stated that he would not be stuck in the hospital with a chest tube. Contacted Dr. Thereasa Solo and asked him to speak with patient. Dr. Thereasa Solo came to unit and spoke to patient. Patient requested that I remove his IV, and that he wanted to sign AMA papers and leave hospital. Patient's wife was in the room with him and she encouraged patient to stay and get treatment. Patient told his wife that he was not staying and he was signing [place] papers.  Patient signed AMA papers and they were placed in the chart. I removed patient's IV. Patient got dressed and walked out of the hospital with his wife.

## 2018-03-04 NOTE — H&P (Signed)
Chief Complaint: Right empyema  Referring Physician(s): J. Minburn Physician: Markus Daft  Patient Status: [facility] - In-pt  History of Present Illness: Derrick Beltran is a 57 y.o. male who presented to the ED yesterday c/o weakness and fatigue.  He reports feeling poorly overall for the past year.  He also reports intermittent hemoptysis and a 50 pound weight loss.  CT scan of the chest revealed a complex fluid collection in the right lung concerning for empyema.  WBC on admission was 16.2.  He is NPO. He had Lovenox 40 mg at 2230 last night.  He is very grumpy and states "I don't like doctors doing strange things to me.... I might just get up and leave".  Past Medical History:  Diagnosis Date  . Empyema ([place]) 03/03/2018  . GERD (gastroesophageal reflux disease)     Past Surgical History:  Procedure Laterality Date  . NO PAST SURGERIES      Allergies: Patient has no known allergies.  Medications: Prior to Admission medications   Medication Sig Start Date End Date Taking? Authorizing Provider  hydrocortisone cream 1 % Place 1 application rectally as needed for itching.  02/21/18  Yes [provider]  naproxen sodium (ALEVE) 220 MG tablet Take 960 mg by mouth daily as needed (pain.).    Yes [provider]     Family History  Problem Relation Age of Onset  . Alzheimer's disease Mother   . Alzheimer's disease Father     Social History   Socioeconomic History  . Marital status: Married    Spouse name: Not on file  . Number of children: Not on file  . Years of education: Not on file  . Highest education level: Not on file  Occupational History  . Not on file  Social Needs  . Financial resource strain: Not on file  . Food insecurity:    Worry: Not on file    Inability: Not on file  . Transportation needs:    Medical: Not on file    Non-medical: Not on file  Tobacco Use  . Smoking status: Current Every Day Smoker   Packs/day: 1.00    Years: 45.00    Pack years: 45.00    Types: Cigarettes  . Smokeless tobacco: Never Used  Substance and Sexual Activity  . Alcohol use: Yes    Alcohol/week: 30.0 standard drinks    Types: 30 Cans of beer per week    Frequency: Never    Comment: 03/03/2018 "3-4 / day and more on weekends "  . Drug use: Never  . Sexual activity: Not Currently  Lifestyle  . Physical activity:    Days per week: Not on file    Minutes per session: Not on file  . Stress: Not on file  Relationships  . Social connections:    Talks on phone: Not on file    Gets together: Not on file    Attends religious service: Not on file    Active member of club or organization: Not on file    Attends meetings of clubs or organizations: Not on file    Relationship status: Not on file  Other Topics Concern  . Not on file  Social History Narrative  . Not on file     Review of Systems: A 12 point ROS discussed and pertinent positives are indicated in the HPI above.  All other systems are negative.  Review of Systems  Vital Signs: BP (!) 105/59 (BP  Location: Left Arm)   Pulse 61   Temp 98 F (36.7 C) (Oral)   Resp 14   Ht 5\' 9"  (1.753 m)   Wt 62.5 kg   SpO2 100%   BMI 20.33 kg/m   Physical Exam  Constitutional: He is oriented to person, place, and time. He appears well-developed.  HENT:  Head: Normocephalic and atraumatic.  Eyes: EOM are normal.  Neck: Normal range of motion.  Cardiovascular: Normal rate, regular rhythm and normal heart sounds.  Pulmonary/Chest: Effort normal.  Diminished breath sounds on the right.  Abdominal: Soft. He exhibits no distension. There is no tenderness.  Musculoskeletal: Normal range of motion.  Neurological: He is alert and oriented to person, place, and time.  Skin: Skin is warm and dry.  Psychiatric: He has a normal mood and affect. His behavior is normal. Judgment and thought content normal.  Vitals reviewed.   Imaging: Ct Chest W  Contrast  Result Date: 03/03/2018 CLINICAL DATA:  57 year old male with history of ill feeling for 1 week. Hemoptysis. Cough. Fevers. Forty-five pack-year history of smoking. EXAM: CT CHEST WITH CONTRAST TECHNIQUE: Multidetector CT imaging of the chest was performed during intravenous contrast administration. CONTRAST:  63mL ISOVUE-300 IOPAMIDOL (ISOVUE-300) INJECTION 61% COMPARISON:  No priors. FINDINGS: Cardiovascular: Heart size is normal. There is no significant pericardial fluid, thickening or pericardial calcification. There is aortic atherosclerosis, as well as atherosclerosis of the great vessels of the mediastinum and the coronary arteries, including calcified atherosclerotic plaque in the left main, left anterior descending, left circumflex and right coronary arteries. Mediastinum/Nodes: Multiple prominent borderline enlarged mediastinal lymph nodes measuring up to 12 mm in short axis in the right paratracheal nodal station. Mildly enlarged subcarinal lymph node measuring 16 mm in short axis. Esophagus is unremarkable in appearance. No axillary lymphadenopathy. Lungs/Pleura: Large right pleural effusion located predominantly in the sub pulmonic region with extensive pleural thickening and enhancement, suspicious for empyema. Widespread airspace consolidation throughout the right lung, with extensive cylindrical and varicose bronchiectasis, as well as thickening of the peribronchovascular interstitium and chronic volume loss. Several cystic appearing spaces are also noted within the right lung, which may represent areas of cystic bronchiectasis or may represent small cavities. Adjacent to the right lung in the mid to upper right hemithorax there is also a small amount of pleural fluid, extensive pleural thickening and enhancement, and a small amount of gas throughout the pleural space which appears predominantly loculated. In the periphery of the left upper lobe there is a 1.3 x 0.9 cm nodule with  macrolobulated margins (axial image 98 of series 3). Left lung is otherwise clear. No left pleural effusion. Upper Abdomen: Aortic atherosclerosis. Musculoskeletal: There are no aggressive appearing lytic or blastic lesions noted in the visualized portions of the skeleton. IMPRESSION: 1. Extensive chronic infectious changes throughout the right lung with widespread areas of bronchiectasis and post infectious scarring, with large complex right-sided hydropneumothorax which has an appearance most suggestive of a highly loculated empyema. 2. 1.3 x 0.9 cm nodule in the left upper lobe (axial image 98 of series 3). Given the findings in the right lung, this may simply be infectious or inflammatory in etiology, however, close attention on follow-up studies is recommended. Repeat chest CT is recommended in 3 months to further evaluate this lesion. This recommendation follows the consensus statement: Guidelines for Management of Incidental Pulmonary Nodules Detected on CT Images: From the Fleischner Society 2017; Radiology 2017; 284:228-243. 3. Aortic atherosclerosis, in addition to left main and 3 vessel  coronary artery disease. Please note that although the presence of coronary artery calcium documents the presence of coronary artery disease, the severity of this disease and any potential stenosis cannot be assessed on this non-gated CT examination. Assessment for potential risk factor modification, dietary therapy or pharmacologic therapy may be warranted, if clinically indicated. These results will be called to the ordering clinician or representative by the Radiologist Assistant, and communication documented in the PACS or zVision Dashboard. Aortic Atherosclerosis (ICD10-I70.0). Electronically Signed   By: Vinnie Langton M.D.   On: 03/03/2018 14:14    Labs:  CBC: Recent Labs    03/03/18 1107  WBC 16.2*  HGB 15.2  HCT 46.4  PLT 561.0*    COAGS: No results for input(s): INR, APTT in the last 8760  hours.  BMP: Recent Labs    03/03/18 1107  NA 126*  K 4.7  CL 89*  CO2 29  GLUCOSE 95  BUN 9  CALCIUM 8.5  CREATININE 0.74    LIVER FUNCTION TESTS: No results for input(s): BILITOT, AST, ALT, ALKPHOS, PROT, ALBUMIN in the last 8760 hours.  TUMOR MARKERS: No results for input(s): AFPTM, CEA, CA199, CHROMGRNA in the last 8760 hours.  Assessment and Plan:  Complex pleural effusion c/w empyema on the right.  Images reviewed by Dr. Anselm Pancoast.  Will check INR.  Will proceed with image guided placement of a pigtail chest catheter today by Dr. Anselm Pancoast.  Risks and benefits discussed with the patient including bleeding, infection, damage to adjacent structures,  and sepsis.  All of the patient's questions were answered, patient is agreeable to proceed. Consent signed and in chart.  Thank you for this interesting consult.  I greatly enjoyed meeting Derrick Beltran and look forward to participating in their care.  A copy of this report was sent to the requesting provider on this date.  Electronically Signed: Murrell Redden, PA-C   03/04/2018, 8:59 AM      I spent a total of 40 Minutes in face to face in clinical consultation, greater than 50% of which was counseling/coordinating care for pigtail chest tube placement.

## 2018-03-07 ENCOUNTER — Telehealth: Payer: Self-pay | Admitting: Emergency Medicine

## 2018-03-07 LAB — QUANTIFERON-TB GOLD PLUS (RQFGPL)
QUANTIFERON MITOGEN VALUE: 0.19 [IU]/mL
QUANTIFERON NIL VALUE: 0.02 [IU]/mL
QUANTIFERON TB2 AG VALUE: 0.01 [IU]/mL
QuantiFERON TB1 Ag Value: 0.02 IU/mL

## 2018-03-07 LAB — QUANTIFERON-TB GOLD PLUS: QuantiFERON-TB Gold Plus: UNDETERMINED

## 2018-03-07 NOTE — Telephone Encounter (Signed)
Per Discharge Summary, Patient left AMA. I spoke with Dr. Birdie Riddle regarding this patient and we have decided not to complete a TCM, due to patient leaving hospital Against medical advise.   See Discharge summary.   Doloris Hall,  LPN

## 2018-03-08 LAB — CULTURE, BLOOD (ROUTINE X 2)
Culture: NO GROWTH
Culture: NO GROWTH
Special Requests: ADEQUATE

## 2018-03-11 NOTE — Telephone Encounter (Signed)
Late entry from 03/10/18 afternoon- Pt wife (pt with her) called in requesting to come into [place] office for additional TB testing. 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 Pt wife stated he would not go back to the ER and planned on going to work 03/11/18. Advised him that TB is infectious and he should go to the ER. Pts wife said that [facility] Dept sent someone to the house 03/10/18 to draw labs but they had the wrong tube and said they would be back 10/18 or 10/21.

## 2018-03-12 ENCOUNTER — Emergency Department (HOSPITAL_COMMUNITY)
Admission: EM | Admit: 2018-03-12 | Discharge: 2018-03-12 | Discharge status: Against Medical Advice | Payer: No Typology Code available for payment source | Source: Home / Self Care | Attending: Emergency Medicine | Admitting: Emergency Medicine

## 2018-03-12 ENCOUNTER — Encounter (HOSPITAL_COMMUNITY): Payer: Self-pay | Admitting: Emergency Medicine

## 2018-03-12 DIAGNOSIS — F17210 Nicotine dependence, cigarettes, uncomplicated: Secondary | ICD-10-CM | POA: Insufficient documentation

## 2018-03-12 DIAGNOSIS — J90 Pleural effusion, not elsewhere classified: Secondary | ICD-10-CM | POA: Diagnosis not present

## 2018-03-12 DIAGNOSIS — J869 Pyothorax without fistula: Secondary | ICD-10-CM

## 2018-03-12 DIAGNOSIS — Z79899 Other long term (current) drug therapy: Secondary | ICD-10-CM | POA: Insufficient documentation

## 2018-03-12 LAB — COMPREHENSIVE METABOLIC PANEL
ALBUMIN: 1.8 g/dL — AB (ref 3.5–5.0)
ALT: 8 U/L (ref 0–44)
ANION GAP: 9 (ref 5–15)
AST: 16 U/L (ref 15–41)
Alkaline Phosphatase: 65 U/L (ref 38–126)
BUN: 6 mg/dL (ref 6–20)
CO2: 23 mmol/L (ref 22–32)
Calcium: 8.1 mg/dL — ABNORMAL LOW (ref 8.9–10.3)
Chloride: 93 mmol/L — ABNORMAL LOW (ref 98–111)
Creatinine, Ser: 0.76 mg/dL (ref 0.61–1.24)
Glucose, Bld: 162 mg/dL — ABNORMAL HIGH (ref 70–99)
POTASSIUM: 4 mmol/L (ref 3.5–5.1)
Sodium: 125 mmol/L — ABNORMAL LOW (ref 135–145)
Total Bilirubin: 0.7 mg/dL (ref 0.3–1.2)
Total Protein: 8.4 g/dL — ABNORMAL HIGH (ref 6.5–8.1)

## 2018-03-12 LAB — CBC
HCT: 44.8 % (ref 39.0–52.0)
HEMOGLOBIN: 13.5 g/dL (ref 13.0–17.0)
MCH: 26.8 pg (ref 26.0–34.0)
MCHC: 30.1 g/dL (ref 30.0–36.0)
MCV: 88.9 fL (ref 80.0–100.0)
NRBC: 0 % (ref 0.0–0.2)
Platelets: 681 10*3/uL — ABNORMAL HIGH (ref 150–400)
RBC: 5.04 MIL/uL (ref 4.22–5.81)
RDW: 14.2 % (ref 11.5–15.5)
WBC: 14.3 10*3/uL — ABNORMAL HIGH (ref 4.0–10.5)

## 2018-03-12 NOTE — ED Notes (Signed)
Pt decided to leave ama after edp explaining the risks of leaving.Marland Kitchen

## 2018-03-12 NOTE — ED Triage Notes (Signed)
Pt presents to ED for assessment of right upper back pain.  States he was told last Thursday he needed to have fluid drained out of his lung, but patient too scared to go through with it at the time,.  Patient returns with continuing pain, denies SOB, denies increased injury.

## 2018-03-12 NOTE — ED Provider Notes (Signed)
[place] EMERGENCY DEPARTMENT Provider Note   CSN: [id number] Arrival date & time: 03/12/18  1203     History   Chief Complaint Chief Complaint  Patient presents with  . Back Pain    HPI Derrick Beltran is a 57 y.o. male.  57 y/o male recently diagnosed with a right lung empyema on 03/03/2018 during his PCP visit. Patient was seeing in the ED and admitted with a pending image guided placement of a pigtail chest catheter on 03/04/2018 by Dr. Anselm Pancoast.He reports when he was in the hospital he felt "really scared so I left". Patient left the facility but returns today as he reports his hemoptysis and weakness have gotten worse. He states he was afraid that he would not be able to take the time off work but reports he was put on short term disability. I have personally reviewed patient's chart and see he was given vanc & zosyn during his visit in the ED on 03/03/2018.She also reports some shortness of breath which is worsening since he left. He denies any fever, chest pain, abdominal pain or other complaints.      Past Medical History:  Diagnosis Date  . Empyema ([place]) 03/03/2018  . GERD (gastroesophageal reflux disease)     Patient Active Problem List   Diagnosis Date Noted  . Thrombocytosis ([place]) 03/04/2018  . Hyponatremia 03/04/2018  . Pulmonary nodule 03/04/2018  . Aortic atherosclerosis ([place]) 03/04/2018  . Tobacco use 03/04/2018  . Empyema ([place]) 03/03/2018    Past Surgical History:  Procedure Laterality Date  . NO PAST SURGERIES          Home Medications    Prior to Admission medications   Medication Sig Start Date End Date Taking? Authorizing Provider  hydrocortisone cream 1 % Place 1 application rectally as needed for itching.  02/21/18   [provider]  naproxen sodium (ALEVE) 220 MG tablet Take 960 mg by mouth daily as needed (pain.).     [provider]    Family History Family History  Problem Relation Age of Onset  .  Alzheimer's disease Mother   . Alzheimer's disease Father     Social History Social History   Tobacco Use  . Smoking status: Current Every Day Smoker    Packs/day: 1.00    Years: 45.00    Pack years: 45.00    Types: Cigarettes  . Smokeless tobacco: Never Used  Substance Use Topics  . Alcohol use: Yes    Alcohol/week: 30.0 standard drinks    Types: 30 Cans of beer per week    Frequency: Never    Comment: 03/03/2018 "3-4 / day and more on weekends "  . Drug use: Never     Allergies   Patient has no known allergies.   Review of Systems Review of Systems  Constitutional: Negative for chills and fever.  HENT: Negative for sore throat.   Respiratory: Positive for cough (hemoptysis ) and shortness of breath.   Cardiovascular: Negative for chest pain.  Gastrointestinal: Negative for abdominal pain, diarrhea, nausea and vomiting.  Genitourinary: Negative for dysuria and flank pain.  Musculoskeletal: Negative for back pain.  Skin: Negative for pallor and wound.  Neurological: Positive for weakness.     Physical Exam Updated Vital Signs BP 119/80 (BP Location: Right Arm)   Pulse 99   Temp 98 F (36.7 C) (Oral)   Resp 20   SpO2 100%   Physical Exam  Constitutional: He is oriented to person, place,  and time. He appears well-developed and well-nourished.  Ill appearing  HENT:  Head: Normocephalic and atraumatic.  Neck: Normal range of motion. Neck supple.  Cardiovascular: Normal heart sounds.  Pulmonary/Chest: Effort normal.  Diminished on right sided, upper > lower  Abdominal: Soft. There is no tenderness.  Neurological: He is alert and oriented to person, place, and time.  Skin: Skin is warm and dry.  Nursing note and vitals reviewed.    ED Treatments / Results  Labs (all labs ordered are listed, but only abnormal results are displayed) Labs Reviewed  CBC - Abnormal; Notable for the following components:      Result Value   WBC 14.3 (*)    Platelets 681  (*)    All other components within normal limits  COMPREHENSIVE METABOLIC PANEL - Abnormal; Notable for the following components:   Sodium 125 (*)    Chloride 93 (*)    Glucose, Bld 162 (*)    Calcium 8.1 (*)    Total Protein 8.4 (*)    Albumin 1.8 (*)    All other components within normal limits    EKG None  Radiology No results found.  Procedures Procedures (including critical care time)  Medications Ordered in ED Medications - No data to display   Initial Impression / Assessment and Plan / ED Course  I have reviewed the triage vital signs and the nursing notes.  Pertinent labs & imaging results that were available during my care of the patient were reviewed by me and considered in my medical decision making (see chart for details).    He presents for a current infection of his right lung, patient was diagnosed with empyema on 03/03/2018.  Patient reports he left AMA last time as he did not want to stay in the hospital as he was afraid of the procedure.  Today he returns wanting to get the procedure done.   CBC shows an slight increase in white count 10.3 with the prior one being 13.  CMP pending.  Will obtain chest x-ray.   I have advised patient that I would need to further obtain labs along with a chest x-ray and hospitalist admission prior to his procedure, patient reports he is not willing to stay he does not want to spend 2 days wasted in the hospital.  He reports he will come back on Monday morning.  I have also provided patient with a phone number to [facility] radiology in order to call them and get this procedure schedule outpatient if needed.    Patient reports he does not want to waste this time.  I have personally reviewed patient's charts and see when he was in the hospital he reported "I am going to leave I do not like doctors ".  At this time patient with wife at the bedside are understanding that I cannot help him or determine his symptoms worsening because he  is being [place].  Final Clinical Impressions(s) / ED Diagnoses   Final diagnoses:  Empyema [facility])    ED Discharge Orders    None       Janeece Fitting, PA-C 03/12/18 [place], MD 03/15/18 1343

## 2018-03-14 ENCOUNTER — Inpatient Hospital Stay (HOSPITAL_COMMUNITY): Payer: No Typology Code available for payment source

## 2018-03-14 ENCOUNTER — Inpatient Hospital Stay (HOSPITAL_COMMUNITY)
Admission: EM | Admit: 2018-03-14 | Discharge: 2018-03-22 | DRG: 177 | Discharge status: Against Medical Advice | Payer: No Typology Code available for payment source | Attending: Internal Medicine | Admitting: Internal Medicine

## 2018-03-14 ENCOUNTER — Other Ambulatory Visit: Payer: Self-pay

## 2018-03-14 ENCOUNTER — Encounter (HOSPITAL_COMMUNITY): Payer: Self-pay | Admitting: Emergency Medicine

## 2018-03-14 DIAGNOSIS — Z9689 Presence of other specified functional implants: Secondary | ICD-10-CM

## 2018-03-14 DIAGNOSIS — E871 Hypo-osmolality and hyponatremia: Secondary | ICD-10-CM | POA: Diagnosis present

## 2018-03-14 DIAGNOSIS — B9689 Other specified bacterial agents as the cause of diseases classified elsewhere: Secondary | ICD-10-CM | POA: Diagnosis present

## 2018-03-14 DIAGNOSIS — R042 Hemoptysis: Secondary | ICD-10-CM | POA: Diagnosis present

## 2018-03-14 DIAGNOSIS — K056 Periodontal disease, unspecified: Secondary | ICD-10-CM | POA: Diagnosis present

## 2018-03-14 DIAGNOSIS — Z720 Tobacco use: Secondary | ICD-10-CM | POA: Diagnosis not present

## 2018-03-14 DIAGNOSIS — R0902 Hypoxemia: Secondary | ICD-10-CM | POA: Diagnosis present

## 2018-03-14 DIAGNOSIS — I10 Essential (primary) hypertension: Secondary | ICD-10-CM | POA: Diagnosis present

## 2018-03-14 DIAGNOSIS — E43 Unspecified severe protein-calorie malnutrition: Secondary | ICD-10-CM | POA: Diagnosis present

## 2018-03-14 DIAGNOSIS — M549 Dorsalgia, unspecified: Secondary | ICD-10-CM | POA: Diagnosis not present

## 2018-03-14 DIAGNOSIS — E440 Moderate protein-calorie malnutrition: Secondary | ICD-10-CM | POA: Diagnosis not present

## 2018-03-14 DIAGNOSIS — D75839 Thrombocytosis, unspecified: Secondary | ICD-10-CM | POA: Diagnosis present

## 2018-03-14 DIAGNOSIS — D473 Essential (hemorrhagic) thrombocythemia: Secondary | ICD-10-CM | POA: Diagnosis present

## 2018-03-14 DIAGNOSIS — J939 Pneumothorax, unspecified: Secondary | ICD-10-CM

## 2018-03-14 DIAGNOSIS — Z5329 Procedure and treatment not carried out because of patient's decision for other reasons: Secondary | ICD-10-CM | POA: Diagnosis not present

## 2018-03-14 DIAGNOSIS — F17210 Nicotine dependence, cigarettes, uncomplicated: Secondary | ICD-10-CM | POA: Diagnosis present

## 2018-03-14 DIAGNOSIS — Z6820 Body mass index (BMI) 20.0-20.9, adult: Secondary | ICD-10-CM | POA: Diagnosis not present

## 2018-03-14 DIAGNOSIS — J690 Pneumonitis due to inhalation of food and vomit: Secondary | ICD-10-CM | POA: Diagnosis not present

## 2018-03-14 DIAGNOSIS — J942 Hemothorax: Secondary | ICD-10-CM | POA: Diagnosis present

## 2018-03-14 DIAGNOSIS — K0889 Other specified disorders of teeth and supporting structures: Secondary | ICD-10-CM | POA: Diagnosis not present

## 2018-03-14 DIAGNOSIS — J869 Pyothorax without fistula: Principal | ICD-10-CM | POA: Diagnosis present

## 2018-03-14 DIAGNOSIS — R451 Restlessness and agitation: Secondary | ICD-10-CM | POA: Diagnosis present

## 2018-03-14 DIAGNOSIS — R079 Chest pain, unspecified: Secondary | ICD-10-CM

## 2018-03-14 DIAGNOSIS — Z781 Physical restraint status: Secondary | ICD-10-CM

## 2018-03-14 DIAGNOSIS — D696 Thrombocytopenia, unspecified: Secondary | ICD-10-CM | POA: Diagnosis present

## 2018-03-14 DIAGNOSIS — F1010 Alcohol abuse, uncomplicated: Secondary | ICD-10-CM | POA: Diagnosis present

## 2018-03-14 DIAGNOSIS — J90 Pleural effusion, not elsewhere classified: Secondary | ICD-10-CM | POA: Diagnosis present

## 2018-03-14 DIAGNOSIS — J850 Gangrene and necrosis of lung: Secondary | ICD-10-CM | POA: Diagnosis not present

## 2018-03-14 DIAGNOSIS — J479 Bronchiectasis, uncomplicated: Secondary | ICD-10-CM | POA: Diagnosis present

## 2018-03-14 DIAGNOSIS — K08439 Partial loss of teeth due to caries, unspecified class: Secondary | ICD-10-CM | POA: Diagnosis not present

## 2018-03-14 DIAGNOSIS — Z978 Presence of other specified devices: Secondary | ICD-10-CM | POA: Diagnosis not present

## 2018-03-14 LAB — PROTIME-INR
INR: 1.31
PROTHROMBIN TIME: 16.2 s — AB (ref 11.4–15.2)

## 2018-03-14 LAB — LACTATE DEHYDROGENASE, PLEURAL OR PERITONEAL FLUID: LD, Fluid: >10000 U/L — ABNORMAL HIGH (ref 3–23)

## 2018-03-14 LAB — BASIC METABOLIC PANEL
Anion gap: 11 (ref 5–15)
BUN: 12 mg/dL (ref 6–20)
CO2: 29 mmol/L (ref 22–32)
CREATININE: 1.1 mg/dL (ref 0.61–1.24)
Calcium: 8.9 mg/dL (ref 8.9–10.3)
Chloride: 91 mmol/L — ABNORMAL LOW (ref 98–111)
GFR calc Af Amer: >60 mL/min (ref >60)
GFR calc non Af Amer: >60 mL/min (ref >60)
Glucose, Bld: 167 mg/dL — ABNORMAL HIGH (ref 70–99)
POTASSIUM: 4.7 mmol/L (ref 3.5–5.1)
Sodium: 131 mmol/L — ABNORMAL LOW (ref 135–145)

## 2018-03-14 LAB — CBC
HEMATOCRIT: 49.7 % (ref 39.0–52.0)
HEMOGLOBIN: 14.9 g/dL (ref 13.0–17.0)
MCH: 27.1 pg (ref 26.0–34.0)
MCHC: 30 g/dL (ref 30.0–36.0)
MCV: 90.4 fL (ref 80.0–100.0)
NRBC: 0 % (ref 0.0–0.2)
Platelets: 633 10*3/uL — ABNORMAL HIGH (ref 150–400)
RBC: 5.5 MIL/uL (ref 4.22–5.81)
RDW: 14.2 % (ref 11.5–15.5)
WBC: 13.4 10*3/uL — AB (ref 4.0–10.5)

## 2018-03-14 LAB — BODY FLUID CELL COUNT WITH DIFFERENTIAL: Total Nucleated Cell Count, Fluid: 296000 cu mm — ABNORMAL HIGH (ref 0–1000)

## 2018-03-14 LAB — GLUCOSE, PLEURAL OR PERITONEAL FLUID

## 2018-03-14 LAB — I-STAT TROPONIN, ED: Troponin i, poc: 0 ng/mL (ref 0.00–0.08)

## 2018-03-14 MED ORDER — ADULT MULTIVITAMIN W/MINERALS CH
1.0000 | ORAL_TABLET | Freq: Every day | ORAL | Status: DC
  Administered 2018-03-14 – 2018-03-22 (×9): 1 via ORAL
  Filled 2018-03-14 (×9): qty 1

## 2018-03-14 MED ORDER — VITAMIN B-1 100 MG PO TABS
100.0000 mg | ORAL_TABLET | Freq: Every day | ORAL | Status: DC
  Administered 2018-03-14 – 2018-03-22 (×9): 100 mg via ORAL
  Filled 2018-03-14 (×9): qty 1

## 2018-03-14 MED ORDER — MIDAZOLAM HCL 2 MG/2ML IJ SOLN
INTRAMUSCULAR | Status: AC
  Filled 2018-03-14: qty 2

## 2018-03-14 MED ORDER — HYDROCODONE-ACETAMINOPHEN 5-325 MG PO TABS
2.0000 | ORAL_TABLET | Freq: Once | ORAL | Status: AC
  Administered 2018-03-14: 2 via ORAL
  Filled 2018-03-14: qty 2

## 2018-03-14 MED ORDER — SODIUM CHLORIDE 0.9 % IV SOLN
INTRAVENOUS | Status: DC
  Administered 2018-03-14: 18:00:00 via INTRAVENOUS

## 2018-03-14 MED ORDER — MIDAZOLAM HCL 2 MG/2ML IJ SOLN
INTRAMUSCULAR | Status: AC | PRN
  Administered 2018-03-14 (×2): 1 mg via INTRAVENOUS

## 2018-03-14 MED ORDER — HYDROCODONE-ACETAMINOPHEN 5-325 MG PO TABS
1.0000 | ORAL_TABLET | ORAL | Status: DC | PRN
  Administered 2018-03-15 – 2018-03-19 (×10): 2 via ORAL
  Administered 2018-03-20: 1 via ORAL
  Administered 2018-03-20 – 2018-03-22 (×4): 2 via ORAL
  Filled 2018-03-14 (×16): qty 2

## 2018-03-14 MED ORDER — LORAZEPAM 1 MG PO TABS
1.0000 mg | ORAL_TABLET | Freq: Four times a day (QID) | ORAL | Status: AC | PRN
  Administered 2018-03-17: 1 mg via ORAL
  Filled 2018-03-14 (×4): qty 1

## 2018-03-14 MED ORDER — ENOXAPARIN SODIUM 40 MG/0.4ML ~~LOC~~ SOLN
40.0000 mg | SUBCUTANEOUS | Status: DC
  Filled 2018-03-14 (×2): qty 0.4

## 2018-03-14 MED ORDER — VANCOMYCIN HCL IN DEXTROSE 750-5 MG/150ML-% IV SOLN
750.0000 mg | Freq: Two times a day (BID) | INTRAVENOUS | Status: DC
  Administered 2018-03-15: 750 mg via INTRAVENOUS
  Filled 2018-03-14: qty 150

## 2018-03-14 MED ORDER — SODIUM CHLORIDE 0.9 % IV SOLN: 500.0000 mg | INTRAVENOUS | Status: DC

## 2018-03-14 MED ORDER — FOLIC ACID 1 MG PO TABS
1.0000 mg | ORAL_TABLET | Freq: Every day | ORAL | Status: DC
  Administered 2018-03-14 – 2018-03-22 (×9): 1 mg via ORAL
  Filled 2018-03-14 (×9): qty 1

## 2018-03-14 MED ORDER — FENTANYL CITRATE (PF) 100 MCG/2ML IJ SOLN
INTRAMUSCULAR | Status: AC | PRN
  Administered 2018-03-14 (×2): 25 ug via INTRAVENOUS

## 2018-03-14 MED ORDER — FENTANYL CITRATE (PF) 100 MCG/2ML IJ SOLN
INTRAMUSCULAR | Status: AC
  Filled 2018-03-14: qty 2

## 2018-03-14 MED ORDER — SODIUM CHLORIDE 0.9 % IV SOLN: 1.0000 g | INTRAVENOUS | Status: DC

## 2018-03-14 MED ORDER — PIPERACILLIN-TAZOBACTAM 3.375 G IVPB 30 MIN
3.3750 g | Freq: Once | INTRAVENOUS | Status: AC
  Administered 2018-03-15: 3.375 g via INTRAVENOUS
  Filled 2018-03-14: qty 50

## 2018-03-14 MED ORDER — LORAZEPAM 2 MG/ML IJ SOLN: 1.0000 mg | Freq: Four times a day (QID) | INTRAMUSCULAR | Status: AC | PRN

## 2018-03-14 MED ORDER — ALUM & MAG HYDROXIDE-SIMETH 200-200-20 MG/5ML PO SUSP
30.0000 mL | Freq: Four times a day (QID) | ORAL | Status: DC | PRN
  Administered 2018-03-14: 30 mL via ORAL
  Filled 2018-03-14: qty 30

## 2018-03-14 MED ORDER — LIDOCAINE 5 % EX PTCH
1.0000 | MEDICATED_PATCH | CUTANEOUS | Status: DC
  Administered 2018-03-14 – 2018-03-21 (×9): 1 via TRANSDERMAL
  Filled 2018-03-14 (×9): qty 1

## 2018-03-14 MED ORDER — ENSURE ENLIVE PO LIQD
237.0000 mL | Freq: Two times a day (BID) | ORAL | Status: DC
  Administered 2018-03-15 – 2018-03-16 (×2): 237 mL via ORAL

## 2018-03-14 MED ORDER — VANCOMYCIN HCL IN DEXTROSE 1-5 GM/200ML-% IV SOLN
1000.0000 mg | Freq: Once | INTRAVENOUS | Status: DC
  Filled 2018-03-14: qty 200

## 2018-03-14 MED ORDER — THIAMINE HCL 100 MG/ML IJ SOLN: 100.0000 mg | Freq: Every day | INTRAMUSCULAR | Status: DC

## 2018-03-14 MED ORDER — PIPERACILLIN-TAZOBACTAM 3.375 G IVPB
3.3750 g | Freq: Three times a day (TID) | INTRAVENOUS | Status: DC
  Administered 2018-03-14 – 2018-03-20 (×17): 3.375 g via INTRAVENOUS
  Filled 2018-03-14 (×16): qty 50

## 2018-03-14 MED ORDER — LIDOCAINE HCL 1 % IJ SOLN
INTRAMUSCULAR | Status: AC
  Filled 2018-03-14: qty 20

## 2018-03-14 MED ORDER — NYSTATIN 100000 UNIT/ML MT SUSP
5.0000 mL | Freq: Four times a day (QID) | OROMUCOSAL | Status: DC
  Administered 2018-03-14 – 2018-03-21 (×29): 500000 [IU] via ORAL
  Filled 2018-03-14 (×30): qty 5

## 2018-03-14 NOTE — Procedures (Signed)
  Pre-operative Diagnosis: Right lung empyema       Post-operative Diagnosis: Right lung empyema   Indications: Right lung empyema.  Hemoptysis and coughing  Procedure: CT guided placement of right chest tube.  Findings: 14 Fr drain placed and removed 850 ml of yellow purulent fluid.  No empyema cavity did not decompress  Complications: None     EBL: None  Plan: Drain to PleurEvac.  Will need Pulmonary and/or CT surgery consult.  Send fluid for analysis.

## 2018-03-14 NOTE — Progress Notes (Signed)
    Referring Physician(s): Thomasenia Bottoms  Supervising Physician: Markus Daft  Patient Status:  [facility] ED  Chief Complaint:  Cough, dyspnea, right pleural effusion? empyema  Subjective: Pt seen in consultation previously by IR on 10/11 for rt chest drain secondary to possible empyema but drain never placed because pt left AMA (see original note for additional details). He now returns to [facility] ED with persistent hemoptysis, weakness, cough, occ chest /back discomfort. Request again received for rt chest drain placement. Pt denies fever, chills, HA. He continues to smoke.   Past Medical History:  Diagnosis Date  . Empyema ([place]) 03/03/2018  . GERD (gastroesophageal reflux disease)    Past Surgical History:  Procedure Laterality Date  . NO PAST SURGERIES         Allergies: Patient has no known allergies.  Medications: Prior to Admission medications   Medication Sig Start Date End Date Taking? Authorizing Provider  hydrocortisone cream 1 % Place 1 application rectally as needed for itching.  02/21/18  Yes [provider]  naproxen sodium (ALEVE) 220 MG tablet Take 960 mg by mouth daily as needed (pain).    Yes [provider]     Vital Signs: BP 111/70   Pulse 98   Temp 98.6 F (37 C)   Resp (!) 21   SpO2 100%   Physical Exam awake/alert; chest- dim BS rt mid to lower lung fields, left clear; heart- sl tachy but regular rhythm; abd- soft,+BS,NT; no LE edema  Imaging: No results found.  Labs:  CBC: Recent Labs    03/03/18 1107 03/04/18 1013 03/12/18 1241 03/14/18 1033  WBC 16.2* 13.4* 14.3* 13.4*  HGB 15.2 14.0 13.5 14.9  HCT 46.4 46.4 44.8 49.7  PLT 561.0* 551* 681* 633*    COAGS: Recent Labs    03/04/18 1015  INR 1.28    BMP: Recent Labs    03/03/18 1107 03/04/18 1013 03/12/18 1241 03/14/18 1033  NA 126* 131* 125* 131*  K 4.7 3.9 4.0 4.7  CL 89* 94* 93* 91*  CO2 29 28 23 29   GLUCOSE 95 87 162* 167*  BUN 9 5* 6 12  CALCIUM 8.5 8.5*  8.1* 8.9  CREATININE 0.74 0.83 0.76 1.10  GFRNONAA  --  >60 >60 >60  GFRAA  --  >60 >60 >60    LIVER FUNCTION TESTS: Recent Labs    03/12/18 1241  BILITOT 0.7  AST 16  ALT 8  ALKPHOS 65  PROT 8.4*  ALBUMIN 1.8*    Assessment and Plan: 57 yo male with hx tobacco abuse, weakness, wt loss, cough/hemoptysis, dyspnea, leukocytosis, large rt pleural effusion with extensive pleural thickening ?empyema, widespread airspace consolidation ,left upper lobe lung nodule; request received for rt chest drain placement; imaging reviewed by Dr. Anselm Pancoast; Risks and benefits discussed with the patient/spouse including bleeding, infection, damage to adjacent structures, bowel perforation/fistula connection, and sepsis.  All of the patient's questions were answered, patient is agreeable to proceed. Consent signed and in chart.  Procedure planned for this afternoon   Electronically Signed: D. Rowe Robert, PA-C 03/14/2018, 2:43 PM   I spent a total of 25 minutes at the the patient's bedside AND on the patient's hospital floor or unit, greater than 50% of which was counseling/coordinating care for right chest drain    Patient ID: Derrick Beltran, male   DOB: 29-Aug-1960, 57 y.o.   MRN: [id number]

## 2018-03-14 NOTE — ED Triage Notes (Signed)
Pt has been seen for the same. He has upper back pain, chest soreness and productive cough. States he was told he needed a thoracentesis but he was too scared to have it done. He came here 19th for same. Now he states he is ready to have it done.

## 2018-03-14 NOTE — Consult Note (Signed)
[facility] Beltr[facility] for Infectious Disease    Date of Admission:  03/14/2018     Total days of antibiotics 1               Reason for Consult: Hemoptysis / Concern for TB  Referring Provider: Lorin Mercy Primary Care Provider: [name], PA-C   Assessment/Plan:  Derrick Beltran  Is a 57 year old male with previous history of tobacco usage admitted with a year long productive coug with occasional hemoptysis and weight loss of >50 pounds found to have a large right pleural effusion suspicious for empyema with possible bronchiectasis or small cavities. Chest tube placed by IR with elevated LD and WBC levels that are consistent with bacterial infection. There is concern for TB although historically does not have risk factors and AFB culture and smear pending. Pleural fluid with gram negative rods on gram stain and no growth in 24 hours on culture. Certainly malignancy remains a concern with his history of smoking.  HIV non-reactive and repeat Quantiferon Gold is in process.   1. Discontinue vancomycin and continue Zoysn for broad spectrum coverage pending culture results. 2. Continue to monitor cultures.  3. Keep on airborne precautions for now pending AFB results.   Active Problems:   Empyema ([place])   . lidocaine      . [START ON 03/15/2018] enoxaparin (LOVENOX) injection  40 mg Subcutaneous Q24H  . nystatin  5 mL Oral QID     HPI: Derrick Beltran is a 57 y.o. male with a previous medical history of a pulmonary nodule and gastroesophageal reflux who presented to the ED with hemoptysis, weakness, cough and back/chest pain.  Initially seen by PCP's office on 03/03/18 with worsening fatigue and weight loss of >50 pounds with intermittent productive cough with hemoptysis. Denies any foreign travel or history of lung disease. STAT chest CT was ordered finding a large right pleural effusion suspicious for empyema. There were also several cystic appearing spaces in the right lung possibly  representing cystic bronchiectasis or small cavities. There was also pleural thickening appearing loculated. In the periphery of the left upper lobe there is a 1.3 x 0.9 cm nodule. He was admitted to the hospital on 03/03/18 with differentials including TB, fungal infection, malignancy or atypical infection. He was supposed to receive a pleural drain, however he left AMA.   Once again seen in the ED on 10/19 with continued hemoptysis and weakness that had worsened. He once again left against medical advice he did not want to be admitted at that time.   He was in the ED on 10/21with pain located in his right midback and chest soreness. Also having dry cough and chills. Had vomiting yesterday which resolved. Vital signs in the ED stable and afebrile. Blood work with WBC coung of 13.4 and platelet count of 633. Quantiferon Gold testing is in process.The Quantiferon Gold test on 10/11 was indeterminate and no AFB testing has be done to this point. Blood cultures from 10/10 without growth.   IR placed right side chest tube able to obtain 850 ml of yellow purulent fluids with no empyema as the cavity did not decompress with drainage. Recommended for Pulmonary and/or CT surgery consult. Afebrile since admission with no leukocytosis today. Right pleural fluid appeared milky and turbid with a WBC count of 296,000 with bacteria present. Fluid cultures with gram negative rods on gram stain. AFB smears and cultures are in process. Most recent blood cultures with no growth in 24  hours. Repeat Quantiferon Gold pending.   Derrick Beltran has been experiencing symptoms of a cough and occasional hemoptysis for about 1 year now. He has also experienced a near 100 pound weight loss which he attributes to his wife being sick and not wanting to eat until she did. He did have continued unexpected weight loss which he attributes maybe 40 pounds to. He has had night sweats. Denies fevers. No travel to other countries. He has not been in  prison or other congregational settings. 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 Continues to smoke about 1 pack per day for about 45 years.   Review of Systems: Review of Systems  Constitutional: Positive for malaise/fatigue and weight loss. Negative for chills and fever.  Respiratory: Positive for cough, hemoptysis and sputum production. Negative for shortness of breath and wheezing.   Cardiovascular: Negative for chest pain and leg swelling.  Gastrointestinal: Negative for abdominal pain, constipation, diarrhea, nausea and vomiting.  Genitourinary: Negative for frequency, hematuria and urgency.  Skin: Negative for rash.  Neurological: Negative for weakness and headaches.     Past Medical History:  Diagnosis Date  . Empyema ([place]) 03/03/2018  . GERD (gastroesophageal reflux disease)     Social History   Tobacco Use  . Smoking status: Current Every Day Smoker    Packs/day: 1.00    Years: 45.00    Pack years: 45.00    Types: Cigarettes  . Smokeless tobacco: Never Used  Substance Use Topics  . Alcohol use: Yes    Alcohol/week: 30.0 standard drinks    Types: 30 Cans of beer per week    Frequency: Never    Comment: 03/03/2018 "3-4 / day and more on weekends "  . Drug use: Never    Family History  Problem Relation Age of Onset  . Alzheimer's disease Mother   . Alzheimer's disease Father     No Known Allergies  OBJECTIVE: Blood pressure 115/76, pulse 98, temperature 98.6 F (37 C), resp. rate (!) 28, SpO2 99 %.  Physical Exam  Constitutional: He is oriented to person, place, and time. He appears well-developed and well-nourished. No distress.  Lying in the bed with head of bed elevated; pleasant.   Cardiovascular: Normal rate, regular rhythm, normal heart sounds and intact distal pulses. Exam reveals no gallop and no friction rub.  No murmur heard. Pulmonary/Chest: Effort normal. No stridor. No respiratory distress. He has wheezes. He has no rales.  He exhibits no tenderness.  Chest tube drain in place and connected to suction. There is yellowish colored thick, milky like drainage with about 170 cc of fluid in the drain.   Abdominal: Soft. Bowel sounds are normal. He exhibits no distension. There is no tenderness. There is no guarding.  Neurological: He is alert and oriented to person, place, and time.  Skin: Skin is warm and dry.  Psychiatric: He has a normal mood and affect. His behavior is normal. Judgment and thought content normal.    Lab Results Lab Results  Component Value Date   WBC 13.4 (H) 03/14/2018   HGB 14.9 03/14/2018   HCT 49.7 03/14/2018   MCV 90.4 03/14/2018   PLT 633 (H) 03/14/2018    Lab Results  Component Value Date   CREATININE 1.10 03/14/2018   BUN 12 03/14/2018   NA 131 (L) 03/14/2018   K 4.7 03/14/2018   CL 91 (L) 03/14/2018   CO2 29 03/14/2018    Lab Results  Component Value Date  ALT 8 03/12/2018   AST 16 03/12/2018   ALKPHOS 65 03/12/2018   BILITOT 0.7 03/12/2018     Microbiology: No results found for this or any previous visit (from the past 240 hour(s)).   Terri Piedra, NP [facility] [phone] Pager  03/14/2018  3:08 PM

## 2018-03-14 NOTE — Progress Notes (Signed)
Infection Prevention  Received call from:  Kesha with [facility]: Regarding: pt currently in ED; she spoke with Lennette Bihari, charge nurse IP Recommendation:  tct Lennette Bihari, charge nurse [phone]; says currently pt is in a room (not airborne wearing an N95 mask).  I told him that patient needs to be in airborne isolation room (negative pressure) and patient is to be wearing a regular surgical mask with the door to room kept closed until moved into negative pressure room.  Staff should wear N95 masks when entering patient's room.  Patient should remain masked until moved to negative pressure room.  Called N. Cooley, to let her know also.

## 2018-03-14 NOTE — ED Provider Notes (Signed)
[place] EMERGENCY DEPARTMENT Provider Note   CSN: [id number] Arrival date & time: 03/14/18  1008     History   Chief Complaint Chief Complaint  Patient presents with  . Back Pain  . Chest Pain    HPI Derrick Beltran is a 57 y.o. male.  HPI   Patient is a 57 year old male with history of GERD who presents emergency department today with request to complete a thoracentesis that was planned for earlier this month but was not completed due to patient leaving the hospital AMA.  He complains of back pain to the right mid back as well as chest "soreness "to the center of his chest.  Has had chills and a dry cough.  No shortness of breath.  Has had decreased p.o. intake for the last several days and generalized weakness.  Had some vomiting yesterday which has since resolved.  No abdominal pain, nausea, vomiting or diarrhea.  Reports some dysuria he had taken Azo for.  Records reviewed.  Patient was seen on 03/03/2018 for fatigue, hemoptysis and weight loss.  At that time he was found to have had a loculated empyema in the right lung.  He was seen by cardiothoracic surgery who was concerned for TB versus fungal process.  CT surgery had recommended drainage of basilar infusion by IR and consulting pulmonary neurology for thrombotic some bronchoscopy.  IR was consulted who planned to proceed with image guided placement of pigtail chest catheter today.  Prior to procedure, patient chose to leave AMA.  He was again seen 03/12/2018 and left AMA prior to admission.  Past Medical History:  Diagnosis Date  . Empyema ([place]) 03/03/2018  . GERD (gastroesophageal reflux disease)     Patient Active Problem List   Diagnosis Date Noted  . Thrombocytosis ([place]) 03/04/2018  . Hyponatremia 03/04/2018  . Pulmonary nodule 03/04/2018  . Aortic atherosclerosis ([place]) 03/04/2018  . Tobacco use 03/04/2018  . Empyema ([place]) 03/03/2018    Past Surgical History:  Procedure Laterality Date  .  NO PAST SURGERIES          Home Medications    Prior to Admission medications   Medication Sig Start Date End Date Taking? Authorizing Provider  hydrocortisone cream 1 % Place 1 application rectally as needed for itching.  02/21/18  Yes [provider]  naproxen sodium (ALEVE) 220 MG tablet Take 960 mg by mouth daily as needed (pain).    Yes [provider]    Family History Family History  Problem Relation Age of Onset  . Alzheimer's disease Mother   . Alzheimer's disease Father     Social History Social History   Tobacco Use  . Smoking status: Current Every Day Smoker    Packs/day: 1.00    Years: 45.00    Pack years: 45.00    Types: Cigarettes  . Smokeless tobacco: Never Used  Substance Use Topics  . Alcohol use: Yes    Alcohol/week: 30.0 standard drinks    Types: 30 Cans of beer per week    Frequency: Never    Comment: 03/03/2018 "3-4 / day and more on weekends "  . Drug use: Never     Allergies   Patient has no known allergies.   Review of Systems Review of Systems  Constitutional: Positive for chills. Negative for fever.  HENT: Negative for rhinorrhea and sore throat.   Eyes: Negative for visual disturbance.  Respiratory: Positive for cough. Negative for shortness of breath.   Cardiovascular: Positive  for chest pain.  Gastrointestinal: Positive for nausea and vomiting (resolved). Negative for abdominal pain, constipation and diarrhea.  Genitourinary: Positive for dysuria. Negative for hematuria.  Musculoskeletal: Positive for back pain.  Skin: Negative for rash.  Neurological: Negative for headaches.    Physical Exam Updated Vital Signs BP 106/74   Pulse 97   Temp 98.6 F (37 C)   Resp (!) 25   SpO2 99%   Physical Exam  Constitutional: No distress.  Thin appearing male  HENT:  Head: Normocephalic and atraumatic.  Eyes: Conjunctivae are normal.  Neck: Neck supple.  Cardiovascular: Normal rate and regular rhythm.  No  murmur heard. Pulmonary/Chest: Effort normal. No respiratory distress.  Decreased breath sounds on right. Rare wheezing.   Abdominal: Soft. Bowel sounds are normal. He exhibits no distension. There is no tenderness.  Musculoskeletal: He exhibits no edema.  Mild TTP to right mid back  Neurological: He is alert.  Skin: Skin is warm and dry.  Psychiatric: He has a normal mood and affect.  Nursing note and vitals reviewed.    ED Treatments / Results  Labs (all labs ordered are listed, but only abnormal results are displayed) Labs Reviewed  BASIC METABOLIC PANEL - Abnormal; Notable for the following components:      Result Value   Sodium 131 (*)    Chloride 91 (*)    Glucose, Bld 167 (*)    All other components within normal limits  CBC - Abnormal; Notable for the following components:   WBC 13.4 (*)    Platelets 633 (*)    All other components within normal limits  PROTIME-INR - Abnormal; Notable for the following components:   Prothrombin Time 16.2 (*)    All other components within normal limits  CULTURE, BLOOD (ROUTINE X 2)  CULTURE, BLOOD (ROUTINE X 2)  EXPECTORATED SPUTUM ASSESSMENT W REFEX TO RESP CULTURE  GRAM STAIN  QUANTIFERON-TB GOLD PLUS  HIV ANTIBODY (ROUTINE TESTING W REFLEX)  STREP PNEUMONIAE URINARY ANTIGEN  I-STAT TROPONIN, ED    EKG None  Radiology No results found.  Procedures Procedures (including critical care time)  Medications Ordered in ED Medications  enoxaparin (LOVENOX) injection 40 mg (has no administration in time range)  0.9 %  sodium chloride infusion (has no administration in time range)  nystatin (MYCOSTATIN) 100000 UNIT/ML suspension 500,000 Units (has no administration in time range)  piperacillin-tazobactam (ZOSYN) IVPB 3.375 g (has no administration in time range)  vancomycin (VANCOCIN) IVPB 1000 mg/200 mL premix (has no administration in time range)  piperacillin-tazobactam (ZOSYN) IVPB 3.375 g (has no administration in time  range)  vancomycin (VANCOCIN) IVPB 750 mg/150 ml premix (has no administration in time range)  lidocaine (XYLOCAINE) 1 % (with pres) injection (has no administration in time range)     Initial Impression / Assessment and Plan / ED Course  I have reviewed the triage vital signs and the nursing notes.  Pertinent labs & imaging results that were available during my care of the patient were reviewed by me and considered in my medical decision making (see chart for details).     Final Clinical Impressions(s) / ED Diagnoses   Final diagnoses:  Empyema, right [facility])   Patient presenting today with chest and back pain.  Recently diagnosed with large loculated empyema on chest CT earlier this month.  Had previously been admitted with plan for IR to drain the empyema, however patient left AMA prior to the procedure.  He presents today requesting the procedure to  be completed.  Lab work with slight leukocytosis and thrombocytosis, leukocytosis slightly improved from 2 days ago.  BMP with slight hyponatremia and hypochloremia.  Troponin is negative.  EKG with sinus tachycardia at 114 with biatrial enlargement and nonspecific T wave abnormality. Pt in agreement with plan for admission.  Consult was placed to IR.  Will plan for admission to hospitalist service for further treatment.  2:02 PM CONSULT with Dr. Lorin Mercy who will admit the patient to the hospitaist service.   ED Discharge Orders    None       Bishop Dublin 03/14/18 [place], MD 03/14/18 2312

## 2018-03-14 NOTE — H&P (Signed)
History and Physical    Derrick Beltran UYQ:[id number] DOB: [birth year] DOA: 03/14/2018  PCP: [name], PA-C Consultants:  None Patient coming from:  Home - lives with wife; Derrick Beltran: Wife, [phone]  Chief Complaint: hemoptysis  HPI: Derrick Beltran is a 57 y.o. male with medical history significant of tobacco dependence with 10/10-11 admission for RUL destructive process concerning for empyema who left AMA prior to thoracentesis presenting with the same symptoms.  Of note, he returned on 10/19 with the same symptoms again and again left AMA.  He has been spitting up blood and mucus for about a year.  It started with mucus and then transitioned to blood maybe 6 months ago.  He is having weakness but not pain.  No fevers but some cold chills at night.  He continues to smoke 1ppd, no desire to quit, 45 pack years.  He declines a patch, "NO! I don't do artifical shit!".  About 100 pound weight loss in a year.  +night sweats.     ED Course:  Admitted earlier this month for empyema - left AMA prior to pigtail placement.  Returned 2 days ago and left AMA.  IR to perform thoracentesis.  PA Tivis Ringer has confirmed that the patient is wiling to remain hospitalized even after the procedure.  Review of Systems: As per HPI; otherwise review of systems reviewed and negative.   Ambulatory Status:  Ambulates without assistance  Past Medical History:  Diagnosis Date  . Empyema ([place]) 03/03/2018  . GERD (gastroesophageal reflux disease)     Past Surgical History:  Procedure Laterality Date  . NO PAST SURGERIES      Social History   Socioeconomic History  . Marital status: Married    Spouse name: Not on file  . Number of children: Not on file  . Years of education: Not on file  . Highest education level: Not on file  Occupational History  . Not on file  Social Needs  . Financial resource strain: Not on file  . Food insecurity:    Worry: Not on file    Inability: Not on file  . Transportation  needs:    Medical: Not on file    Non-medical: Not on file  Tobacco Use  . Smoking status: Current Every Day Smoker    Packs/day: 1.00    Years: 45.00    Pack years: 45.00    Types: Cigarettes  . Smokeless tobacco: Never Used  Substance and Sexual Activity  . Alcohol use: Yes    Alcohol/week: 30.0 standard drinks    Types: 30 Cans of beer per week    Frequency: Never    Comment: 03/03/2018 "3-4 / day and more on weekends "  . Drug use: Never  . Sexual activity: Not Currently  Lifestyle  . Physical activity:    Days per week: Not on file    Minutes per session: Not on file  . Stress: Not on file  Relationships  . Social connections:    Talks on phone: Not on file    Gets together: Not on file    Attends religious service: Not on file    Active member of club or organization: Not on file    Attends meetings of clubs or organizations: Not on file    Relationship status: Not on file  . Intimate partner violence:    Fear of current or ex partner: Not on file    Emotionally abused: Not on file    Physically abused: Not  on file    Forced sexual activity: Not on file  Other Topics Concern  . Not on file  Social History Narrative  . Not on file    No Known Allergies  Family History  Problem Relation Age of Onset  . Alzheimer's disease Mother   . Alzheimer's disease Father     Prior to Admission medications   Medication Sig Start Date End Date Taking? Authorizing Provider  hydrocortisone cream 1 % Place 1 application rectally as needed for itching.  02/21/18  Yes [provider]  naproxen sodium (ALEVE) 220 MG tablet Take 960 mg by mouth daily as needed (pain).    Yes [provider]    Physical Exam: Vitals:   03/14/18 1705 03/14/18 1710 03/14/18 1716 03/14/18 1720  BP: 111/77 105/64 (!) 100/57 103/63  Pulse: 90 92 93 92  Resp: 20 (!) 25 (!) 25 (!) 25  Temp:      SpO2: 98% 100% 100% 97%     General:  Appears calm and comfortable and is NAD;  he is cachectic Eyes:  PERRL, EOMI, normal lids, iris ENT:  grossly normal hearing, lips; tongue with white plaques c/w thrush; poor dentition Neck:  no LAD, masses or thyromegaly; no carotid bruits Cardiovascular:  Tachycardia, no m/r/g. No LE edema.  Respiratory:  Diffusely diminished breath sounds on the right.  Normal to mildly increased respiratory effort. Abdomen:  soft, NT, ND, NABS Back:   normal alignment, no CVAT Skin:  no rash or induration seen on limited exam Musculoskeletal:  grossly normal tone BUE/BLE, good ROM, no bony abnormality Psychiatric:  grossly normal mood and affect, speech fluent and appropriate, AOx3 Neurologic:  CN 2-12 grossly intact, moves all extremities in coordinated fashion, sensation intact    Radiological Exams on Admission: No results found.  EKG: Independently reviewed.  Sinus tachycardia with rate 114; nonspecific ST changes with no evidence of acute ischemia   Labs on Admission: I have personally reviewed the available labs and imaging studies at the time of the admission.  Pertinent labs:   Na++ 131, up from 125 Glucose 167 Troponin 0.00 WBC 13.4 Platelets 633   Assessment/Plan Principal Problem:   Empyema (HCC) Active Problems:   Thrombocytosis (HCC)   Hyponatremia   Tobacco use   Empyema -Patient with an extensive destructive process of the right lung -He was previously scheduled for IR-assisted drainage with insertion of pigtail catheter; he left AMA both times -He returns today with ongoing symptoms and acknowledges willingness to remain in the hospital indefinitely while this is drained and addressed -ID is consulting -Infection is the most likely etiology, although underlying malignancy with a post-obstructive infection is also a consideration -As such, will cover with Vanc and Zosyn for now, after discussion with pharmacy -TB is also a serious consideration; will place in negative pressure room while awaiting AFBs and  quantiferon gold.  He does not have obvious/known RF for TB. -He is currently in IR undergoing drainage of the infection and/or placement of pigtail catheter; based on these results will need to determine whether to consult pulm and/or CT surgery -Fluid will be sent for typical studies including fungal, TB, GS/culture, and cytology  Hyponatremia -Improved -Likely related to poor PO intake in the setting of this large lung issue that has been untreated  Thrombocytosis -Likely reactive  Tobacco dependence -Encourage cessation.  This was discussed with the patient and should be reviewed on an ongoing basis.  -Patch declined by patient due to its "artificial"  nature.  ETOH abuse -Will monitor on CIWA - suspect that patient minimizes his use, which appears to be extensive    DVT prophylaxis:  Lovenox  Code Status:  Full - confirmed with patient/family Family Communication: Wife present throughout evaluation Disposition Plan:  Home once clinically improved Consults called: IR; CT surgery  Admission status: Admit - It is my clinical opinion that admission to INPATIENT is reasonable and necessary because of the expectation that this patient will require hospital care that crosses at least 2 midnights to treat this condition based on the medical complexity of the problems presented.  Given the aforementioned information, the predictability of an adverse outcome is felt to be significant.    Karmen Bongo MD [facility]  If note is complete, please contact covering daytime or nighttime physician. [email or web address] Password [facility]  03/14/2018, 5:34 PM

## 2018-03-14 NOTE — ED Notes (Signed)
Pt informed that surgical mask must be worn at all times when not in an airborne room. Surgcial mask provided and placed on pt.

## 2018-03-14 NOTE — Plan of Care (Signed)
  Problem: Pain Managment: Goal: General experience of comfort will improve Outcome: Progressing   Problem: Safety: Goal: Ability to remain free from injury will improve Outcome: Progressing   Problem: Skin Integrity: Goal: Risk for impaired skin integrity will decrease Outcome: Progressing   

## 2018-03-14 NOTE — Progress Notes (Signed)
Pharmacy Antibiotic Note  Derrick Beltran is a 57 y.o. male admitted on 03/14/2018 with pneumonia.  Pharmacy has been consulted for vancomycin and zosyn dosing. Pt is afebrile but WBC is elevated at 13.4. SCr is WNL. Recently left AMA prior to thoracentesis.   Plan: Zosyn 3.375g IV Q8H (4 hr inf) Vanc 1gm IV x 1 then 750mg  IV Q12H F/u renal fxn, C&S, clinical status and trough at SS    Temp (24hrs), Avg:98.6 F (37 C), Min:98.6 F (37 C), Max:98.6 F (37 C)  Recent Labs  Lab 03/12/18 1241 03/14/18 1033  WBC 14.3* 13.4*  CREATININE 0.76 1.10    Estimated Creatinine Clearance: 65.5 mL/min (by C-G formula based on SCr of 1.1 mg/dL).    No Known Allergies  Antimicrobials this admission: Vanc 10/21>> Zosyn 10/21>>  Dose adjustments this admission: N/A  Microbiology results: Pending  Thank you for allowing pharmacy to be a part of this patient's care.  Alphonsus Doyel, Rande Lawman 03/14/2018 2:52 PM

## 2018-03-14 NOTE — ED Notes (Signed)
Report given to [facility] RN # [phone]

## 2018-03-15 ENCOUNTER — Inpatient Hospital Stay (HOSPITAL_COMMUNITY): Payer: No Typology Code available for payment source

## 2018-03-15 DIAGNOSIS — Z720 Tobacco use: Secondary | ICD-10-CM

## 2018-03-15 DIAGNOSIS — E871 Hypo-osmolality and hyponatremia: Secondary | ICD-10-CM

## 2018-03-15 DIAGNOSIS — R0902 Hypoxemia: Secondary | ICD-10-CM

## 2018-03-15 DIAGNOSIS — J869 Pyothorax without fistula: Principal | ICD-10-CM

## 2018-03-15 DIAGNOSIS — J690 Pneumonitis due to inhalation of food and vomit: Secondary | ICD-10-CM

## 2018-03-15 DIAGNOSIS — J90 Pleural effusion, not elsewhere classified: Secondary | ICD-10-CM

## 2018-03-15 LAB — CBC WITH DIFFERENTIAL/PLATELET
ABS IMMATURE GRANULOCYTES: 0.03 10*3/uL (ref 0.00–0.07)
BASOS PCT: 1 %
Basophils Absolute: 0.1 10*3/uL (ref 0.0–0.1)
Eosinophils Absolute: 0.1 10*3/uL (ref 0.0–0.5)
Eosinophils Relative: 1 %
HCT: 39.4 % (ref 39.0–52.0)
Hemoglobin: 12.4 g/dL — ABNORMAL LOW (ref 13.0–17.0)
IMMATURE GRANULOCYTES: 0 %
Lymphocytes Relative: 13 %
Lymphs Abs: 1.2 10*3/uL (ref 0.7–4.0)
MCH: 27.7 pg (ref 26.0–34.0)
MCHC: 31.5 g/dL (ref 30.0–36.0)
MCV: 88.1 fL (ref 80.0–100.0)
Monocytes Absolute: 0.9 10*3/uL (ref 0.1–1.0)
Monocytes Relative: 10 %
NEUTROS ABS: 7.1 10*3/uL (ref 1.7–7.7)
NEUTROS PCT: 75 %
NRBC: 0 % (ref 0.0–0.2)
Platelets: 460 10*3/uL — ABNORMAL HIGH (ref 150–400)
RBC: 4.47 MIL/uL (ref 4.22–5.81)
RDW: 14.1 % (ref 11.5–15.5)
WBC: 9.5 10*3/uL (ref 4.0–10.5)

## 2018-03-15 LAB — BASIC METABOLIC PANEL
Anion gap: 6 (ref 5–15)
BUN: 11 mg/dL (ref 6–20)
CO2: 28 mmol/L (ref 22–32)
Calcium: 8.2 mg/dL — ABNORMAL LOW (ref 8.9–10.3)
Chloride: 96 mmol/L — ABNORMAL LOW (ref 98–111)
Creatinine, Ser: 0.85 mg/dL (ref 0.61–1.24)
Glucose, Bld: 97 mg/dL (ref 70–99)
POTASSIUM: 3.8 mmol/L (ref 3.5–5.1)
SODIUM: 130 mmol/L — AB (ref 135–145)

## 2018-03-15 LAB — HIV ANTIBODY (ROUTINE TESTING W REFLEX): HIV SCREEN 4TH GENERATION: NONREACTIVE

## 2018-03-15 LAB — OSMOLALITY: OSMOLALITY: 281 mosm/kg (ref 275–295)

## 2018-03-15 NOTE — Care Management Note (Signed)
Case Management Note  Patient Details  Name: Derrick Beltran MRN: [id number] Date of Birth: [birth year]  Subjective/Objective:                    Action/Plan:  Will continue to follow for discharge needs. Patient currently has Pleur vac . Surgical consult pending  Expected Discharge Date:                  Expected Discharge Plan:  [place]  In-House Referral:     Discharge planning Services     Post Acute Care Choice:    Choice offered to:     DME Arranged:    DME Agency:     HH Arranged:    [place] Agency:     Status of Service:  In process, will continue to follow  If discussed at Long Length of Stay Meetings, dates discussed:    Additional Comments:  Marilu Favre, RN 03/15/2018, 12:37 PM

## 2018-03-15 NOTE — Progress Notes (Signed)
[facility] Infusion Coordinator will follow pt with ID team to support Home Infusion Pharmacy services if needed at DC to home.  If patient discharges after hours, please call [phone].   Larry Sierras 03/15/2018, 1:55 PM

## 2018-03-15 NOTE — Progress Notes (Signed)
Referring Physician(s): Dr Algis Liming  Supervising Physician: Arne [place]  Patient Status:  [facility] - In-pt  Chief Complaint:  Rt empyema  Subjective:  Drain placed 10/21 Actually feels some better today Breathing easier OP purulent 140 cc in Pleurvac  Allergies: Patient has no known allergies.  Medications: Prior to Admission medications   Medication Sig Start Date End Date Taking? Authorizing Provider  hydrocortisone cream 1 % Place 1 application rectally as needed for itching.  02/21/18  Yes [provider]  naproxen sodium (ALEVE) 220 MG tablet Take 960 mg by mouth daily as needed (pain).    Yes [provider]     Vital Signs: BP 98/66 (BP Location: Right Arm)   Pulse (!) 58   Temp (!) 97.5 F (36.4 C) (Oral)   Resp 20   SpO2 98%   Physical Exam  Pulmonary/Chest: Effort normal. He has wheezes in the right upper field, the right middle field and the right lower field.  Skin: Skin is warm and dry.  Site is clean and dry Tender to touch No bleeding OP into Pleurvac purulent 140 cc in vac No air leak  Vitals reviewed.   Imaging: Ct Image Guided Drainage By Percutaneous Catheter  Result Date: 03/14/2018 INDICATION: 57 year old with hemoptysis and probable right lung empyema. Patient presents for chest tube placement. EXAM: CT-GUIDED PLACEMENT OF RIGHT CHEST TUBE MEDICATIONS: Moderate sedation medication ANESTHESIA/SEDATION: Fentanyl 50 mcg IV; Versed 2 mg IV Moderate Sedation Time:  39 minutes The patient was continuously monitored during the procedure by the interventional radiology nurse under my direct supervision. COMPLICATIONS: None immediate. PROCEDURE: Informed written consent was obtained from the patient after a thorough discussion of the procedural risks, benefits and alternatives. All questions were addressed. Maximal Sterile Barrier Technique was utilized including caps, mask, sterile gowns, sterile gloves, sterile drape, hand  hygiene and skin antiseptic. Procedure was also performed with respiratory droplet precautions. A timeout was performed prior to the initiation of the procedure. Patient was placed on his left side and CT images through the chest were obtained. The right mid axillary region was prepped and draped in sterile fashion. Skin and soft tissues were anesthetized with 1% lidocaine. Yueh catheter was directed into the pleural space and purulent thick yellow fluid was aspirated. A stiff Amplatz wire was placed. The tract was dilated to accommodate a 14 [place] multipurpose drain. Total of 850 mL of yellow purulent fluid was removed. Catheter was sutured to skin and attached to a PleurEvac. FINDINGS: Large fluid collection in the right lower chest. 850 mL of purulent fluid was removed from this cavity. Follow up CT images demonstrate a large hydropneumothorax following evacuation of most of the pleural fluid. Initial CT images again demonstrated bronchiectasis and severe parenchymal disease in the right upper lung. Complex hydropneumothorax along the right upper lobe. Again noted is a mildly spiculated nodule in the left lung which was present on the previous diagnostic examination. IMPRESSION: CT-guided placement of a chest tube in the right lung empyema. 850 mL of yellow purulent fluid was removed. Fluid was sent for analysis. Electronically Signed   By: Markus Daft M.D.   On: 03/14/2018 18:01    Labs:  CBC: Recent Labs    03/04/18 1013 03/12/18 1241 03/14/18 1033 03/15/18 0500  WBC 13.4* 14.3* 13.4* 9.5  HGB 14.0 13.5 14.9 12.4*  HCT 46.4 44.8 49.7 39.4  PLT 551* 681* 633* 460*    COAGS: Recent Labs    03/04/18 1015 03/14/18 1339  INR 1.28 1.31    BMP: Recent Labs    03/04/18 1013 03/12/18 1241 03/14/18 1033 03/15/18 0500  NA 131* 125* 131* 130*  K 3.9 4.0 4.7 3.8  CL 94* 93* 91* 96*  CO2 28 23 29 28   GLUCOSE 87 162* 167* 97  BUN 5* 6 12 11   CALCIUM 8.5* 8.1* 8.9 8.2*  CREATININE 0.83  0.76 1.10 0.85  GFRNONAA >60 >60 >60 >60  GFRAA >60 >60 >60 >60    LIVER FUNCTION TESTS: Recent Labs    03/12/18 1241  BILITOT 0.7  AST 16  ALT 8  ALKPHOS 65  PROT 8.4*  ALBUMIN 1.8*    Assessment and Plan:  Right empyema Drain placed in IR 10/21 Feels better We will follow  Electronically Signed: Marielouise Amey A, PA-C 03/15/2018, 7:07 AM   I spent a total of 15 Minutes at the the patient's bedside AND on the patient's hospital floor or unit, greater than 50% of which was counseling/coordinating care for rt empyema drain

## 2018-03-15 NOTE — Plan of Care (Signed)

## 2018-03-15 NOTE — Progress Notes (Signed)
PROGRESS NOTE   Derrick Beltran  NOM:[id number]    DOB: 02-26-1961    DOA: 03/14/2018  PCP: [name], PA-C   I have briefly reviewed patients previous medical records in [facility].  Brief Narrative:  57 year old married male, works as a Games developer, PMH of tobacco abuse, recently hospitalized 10/10-10/11 with complaints of progressive weakness/fatigue, cough with intermittent hemoptysis for almost a year, profound weight loss, had been seen by his PCP when a chest x-ray was done that showed abnormality and was sent to the hospital for evaluation.  CT chest was abnormal with destroyed right upper lobe, right pleural effusion, concern for significant pathology including but not limited to TB, atypical infection/fungal infection or even malignancy.  Plan was for thoracentesis by IR followed by further evaluation. TCTS had consulted.  Patient then left AMA without thoracentesis.  He returned to the ED again on 10/19 with worsening hemoptysis and weakness Amao was advised hospital admission but again left AMA.  He returned to ED 10/21 with ongoing cough with intermittent hemoptysis but no chest pain or dyspnea, some chills and night sweats, profound weight loss of about 100 pounds in a year.  Agreed for admission.  IR consulted and CT-guided right 14 French drain placed, 850 mL purulent fluid drained.  Admitted with airborne precautions. TCTS, Pulmonology and ID consulted by me on 10/22.   Assessment & Plan:   Principal Problem:   Empyema ([place]) Active Problems:   Thrombocytosis (HCC)   Hyponatremia   Tobacco use   Right empyema thoracis/hydropneumothorax IR was consulted and patient underwent CT-guided placement of 14 French drain yielding 850 mL of purulent fluid.  Pleural fluid exudative, glucose <20, LDH >10K, WBC 296 and unable to perform differential due to cellular degeneration, abundant gram-negative rods on Gram stain, culture negative to date, AFB stain/culture, fungal  culture & cytology pending.  HIV screen nonreactive.  QuantiFERON TB Gold from 10/11: Indeterminate and repeat from 10/21 pending.  Clinically better.  Follow-up chest x-ray 10/22 with improvement, decreased hydropneumothorax.  Continue empirically started IV Zosyn.  Vancomycin discontinued.  Etiology unclear.  Rule out TB, underlying malignancy complicated by secondary infection.  IR on board.TCTS consulted to see if any surgical intervention warranted.  Pulmonology consulted regarding needs for FOB.  ID consulted.  Blood cultures negative to date.  Right upper lobe bronchiectasis CT chest 10/10 shows destroyed right upper lobe.  These abnormal findings were present on chest x-ray over a year ago and again in July of this year.  As discussed with ID, given course of events, less likely to be pulmonary TB but will continue to rule out but more concerning for underlying malignant process.  Pulmonology consulted for possible bronchoscopy.  Left upper lobe pulmonary nodule Reactive versus related to above.  Needs follow-up.  Hyponatremia ?  SIADH.  Clinically appears euvolemic.  Follow BMP.  Check serum and urine osmolarity.  Tobacco abuse:  Cessation counseled.  Patient declined nicotine patch.  Alcohol abuse Continue CIWA call.  Severe protein calorie malnutrition Dietitian consulted.   DVT prophylaxis: Lovenox Code Status: Full Family Communication: None at bedside Disposition: To be determined pending clinical improvement   Consultants:  IR TCTS Pulmonology ID  Procedures:  CT-guided right 14 French drain placed by IR on 10/21  Antimicrobials:  Vancomycin-discontinued Zosyn   Subjective: Feels better.  Denies dyspnea.  Some intermittent right-sided chest pain.  Minimal cough without much blood since admission.  No fever or chills reported.  ROS: As above.  Objective:  Vitals:   03/14/18 1742 03/14/18 2056 03/15/18 0329 03/15/18 0533  BP: 106/73 109/71 114/61 98/66    Pulse: 82 85 (!) 58 (!) 58  Resp: 20 20 16 20   Temp: 98.1 F (36.7 C) 98.4 F (36.9 C) 97.7 F (36.5 C) (!) 97.5 F (36.4 C)  TempSrc: Oral Oral Oral Oral  SpO2: 100% 100% 99% 98%    Examination:  General exam: Young male, moderately built, thinly nourished, lying comfortably propped up in bed. Respiratory system: Right-sided chest tube connected to drain.  Reduced breath sounds right lung fields, especially in the bases with few basal crackles.  Left lung clear to auscultation. Respiratory effort normal. Cardiovascular system: S1 & S2 heard, RRR. No JVD, murmurs, rubs, gallops or clicks. No pedal edema. Gastrointestinal system: Abdomen is nondistended, soft and nontender. No organomegaly or masses felt. Normal bowel sounds heard. Central nervous system: Alert and oriented. No focal neurological deficits. Extremities: Symmetric 5 x 5 power. Skin: No rashes, lesions or ulcers Psychiatry: Judgement and insight appear normal. Mood & affect irritable.     Data Reviewed: I have personally reviewed following labs and imaging studies  CBC: Recent Labs  Lab 03/12/18 1241 03/14/18 1033 03/15/18 0500  WBC 14.3* 13.4* 9.5  NEUTROABS  --   --  7.1  HGB 13.5 14.9 12.4*  HCT 44.8 49.7 39.4  MCV 88.9 90.4 88.1  PLT 681* 633* 102*   Basic Metabolic Panel: Recent Labs  Lab 03/12/18 1241 03/14/18 1033 03/15/18 0500  NA 125* 131* 130*  K 4.0 4.7 3.8  CL 93* 91* 96*  CO2 23 29 28   GLUCOSE 162* 167* 97  BUN 6 12 11   CREATININE 0.76 1.10 0.85  CALCIUM 8.1* 8.9 8.2*   Liver Function Tests: Recent Labs  Lab 03/12/18 1241  AST 16  ALT 8  ALKPHOS 65  BILITOT 0.7  PROT 8.4*  ALBUMIN 1.8*   Coagulation Profile: Recent Labs  Lab 03/14/18 1339  INR 1.31     Recent Results (from the past 240 hour(s))  Culture, blood (routine x 2) Call MD if unable to obtain prior to antibiotics being given     Status: None (Preliminary result)   Collection Time: 03/14/18  3:44 PM   Result Value Ref Range Status   Specimen Description BLOOD RIGHT ANTECUBITAL  Final   Special Requests   Final    BOTTLES DRAWN AEROBIC AND ANAEROBIC Blood Culture adequate volume   Culture   Final    NO GROWTH < 24 HOURS Performed at [facility], [place] [address] [place], [place] [address]    Report Status PENDING  Incomplete  Culture, blood (routine x 2) Call MD if unable to obtain prior to antibiotics being given     Status: None (Preliminary result)   Collection Time: 03/14/18  3:55 PM  Result Value Ref Range Status   Specimen Description BLOOD LEFT ANTECUBITAL  Final   Special Requests   Final    BOTTLES DRAWN AEROBIC AND ANAEROBIC Blood Culture adequate volume   Culture   Final    NO GROWTH < 24 HOURS Performed at [facility], [address], [place], [place] [address]    Report Status PENDING  Incomplete  Aerobic/Anaerobic Culture (surgical/deep wound)     Status: None (Preliminary result)   Collection Time: 03/14/18  5:41 PM  Result Value Ref Range Status   Specimen Description ABSCESS  Final   Special Requests Normal  Final  Gram Stain   Final    ABUNDANT WBC PRESENT, PREDOMINANTLY PMN ABUNDANT GRAM NEGATIVE RODS    Culture   Final    NO GROWTH < 24 HOURS Performed at [facility] [address], [place], [place] [address]    Report Status PENDING  Incomplete         Radiology Studies: Dg Chest Port 1 View  Result Date: 03/15/2018 CLINICAL DATA:  Right chest tube. EXAM: PORTABLE CHEST 1 VIEW COMPARISON:  CT 03/14/2018, 03/03/2018.  Chest x-ray 12/21/2017. FINDINGS: Right chest tube noted over the right lung base in stable position. Known right hydropneumothorax appears to have decreased in size. No evidence of prominent recurrent effusion. Small right pleural effusion may be present. Severe right pleuroparenchymal thickening noted most consistent with scarring. Chronic pulmonary changes including bronchiectasis are again noted unchanged.  Previously identified left pulmonary nodule best identified on prior recent CT, reference is made to prior recent CT of 03/03/2018. Heart size normal. No acute bony abnormality or changes thoracic spine IMPRESSION: 1. Right chest tube noted over the right lung base in stable position. Known right hydropneumothorax appears to be decreased in size. No evidence of prominent recurrent effusion. Small right pleural effusion may be present. 2. Severe right pleural-parenchymal thickening again noted consistent scarring. Chronic pulmonary changes including bronchiectasis are again noted in the right lung. No interim change. Previously identified left pulmonary nodule best identified on prior recent CT, reference is made to prior chest CT of 03/03/2018. Electronically Signed   By: Marcello Moores  Register   On: 03/15/2018 07:52   Ct Image Guided Drainage By Percutaneous Catheter  Result Date: 03/14/2018 INDICATION: 57 year old with hemoptysis and probable right lung empyema. Patient presents for chest tube placement. EXAM: CT-GUIDED PLACEMENT OF RIGHT CHEST TUBE MEDICATIONS: Moderate sedation medication ANESTHESIA/SEDATION: Fentanyl 50 mcg IV; Versed 2 mg IV Moderate Sedation Time:  39 minutes The patient was continuously monitored during the procedure by the interventional radiology nurse under my direct supervision. COMPLICATIONS: None immediate. PROCEDURE: Informed written consent was obtained from the patient after a thorough discussion of the procedural risks, benefits and alternatives. All questions were addressed. Maximal Sterile Barrier Technique was utilized including caps, mask, sterile gowns, sterile gloves, sterile drape, hand hygiene and skin antiseptic. Procedure was also performed with respiratory droplet precautions. A timeout was performed prior to the initiation of the procedure. Patient was placed on his left side and CT images through the chest were obtained. The right mid axillary region was prepped and  draped in sterile fashion. Skin and soft tissues were anesthetized with 1% lidocaine. Yueh catheter was directed into the pleural space and purulent thick yellow fluid was aspirated. A stiff Amplatz wire was placed. The tract was dilated to accommodate a 14 [place] multipurpose drain. Total of 850 mL of yellow purulent fluid was removed. Catheter was sutured to skin and attached to a PleurEvac. FINDINGS: Large fluid collection in the right lower chest. 850 mL of purulent fluid was removed from this cavity. Follow up CT images demonstrate a large hydropneumothorax following evacuation of most of the pleural fluid. Initial CT images again demonstrated bronchiectasis and severe parenchymal disease in the right upper lung. Complex hydropneumothorax along the right upper lobe. Again noted is a mildly spiculated nodule in the left lung which was present on the previous diagnostic examination. IMPRESSION: CT-guided placement of a chest tube in the right lung empyema. 850 mL of yellow purulent fluid was removed. Fluid was sent for analysis. Electronically Signed  By: Markus Daft M.D.   On: 03/14/2018 18:01        Scheduled Meds: . enoxaparin (LOVENOX) injection  40 mg Subcutaneous Q24H  . feeding supplement (ENSURE ENLIVE)  237 mL Oral BID BM  . folic acid  1 mg Oral Daily  . lidocaine  1 patch Transdermal Q24H  . multivitamin with minerals  1 tablet Oral Daily  . nystatin  5 mL Oral QID  . thiamine  100 mg Oral Daily   Or  . thiamine  100 mg Intravenous Daily   Continuous Infusions: . sodium chloride 75 mL/hr at 03/14/18 1808  . piperacillin-tazobactam    . piperacillin-tazobactam (ZOSYN)  IV 3.375 g (03/15/18 0529)     LOS: 1 day     Vernell Leep, MD, FACP, [facility]. [facility] Pager [phone]  If 7PM-7AM, please contact night-coverage [email or web address] Password [email or web address] 03/15/2018, 1:27 PM

## 2018-03-15 NOTE — Consult Note (Addendum)
NAME:  Derrick Beltran, MRN:  [id number], DOB:  04-Jun-1960, LOS: 1 ADMISSION DATE:  03/14/2018, CONSULTATION DATE:  10/22 REFERRING MD:  Algis Liming, CHIEF COMPLAINT:  Empyema and evaluate lung mass   Brief History   57 year old male patient history of EtOH, poor dentition, admitted 10/21 with right empyema and progressive right upper lobe airspace disease  Past Medical History  Smoking, periodontal disease, hypertension, EtOH  Significant Hospital Events     Consults: date of consult/date signed off & final recs:  ID 10/22 Thoracic surg 10/22  Procedures (surgical and bedside):  Chest tube placed 10/22  Significant Diagnostic Tests:  Chest tube right: LDH: >10K, turbid, WBC 296K; cytology >>> 10/10 CT chest:Large right pleural effusion loculated predominantly in the subpulmonic region with extensive pleural thickening and enhancement worrisome for empyema is widespread airspace disease and consolidation through the right lung with extensive cylindrical bronchiectasis changes in the upper lobe some of which appear cystic. Micro Data:  Pleural drainage 10/21: abundant GNR>>> BCX2 10/22>>> Fungal 10/22>>> Abscess culture 10/22: GNR HIV NR QFG 10/21>>> 10/11 QFG: indeterm  Antimicrobials:  10/21 vancomycin 10/21 Zosyn  Subjective:  No distress no longer coughing  Objective   Blood pressure 98/66, pulse (Abnormal) 58, temperature (Abnormal) 97.5 F (36.4 C), temperature source Oral, resp. rate 20, SpO2 98 %.        Intake/Output Summary (Last 24 hours) at 03/15/2018 1228 Last data filed at 03/15/2018 0529 Gross per 24 hour  Intake 938.33 ml  Output 385 ml  Net 553.33 ml   There were no vitals filed for this visit.  Examination: General: 57 year old male patient currently resting in bed no acute distress HENT: Normocephalic atraumatic poor dentition Lungs: Clear decreased base right chest tube draining purulent discharge Cardiovascular: Regular rate and  rhythm Abdomen: Soft nontender Extremities: No edema brisk cap refill Neuro: alert oriented GU: Voids  Resolved Hospital Problem list     Assessment & Plan:  Cavitary pneumonia with right empyema, he has had undefined right upper lobe process dating back to over a year ago -Primary differential diagnosis includes infection versus malignancy versus combination of both.  He has a history of EtOH abuse, as well as poor dentition so certainly this could be etiology.  Also consider atypical bacteria such as tuberculosis or finally fungal process inclined to think infection from chronic aspiration given patient profile. Plan Continue current antibiotics Await pleural AFB Await cytology Follow-up culture data Continue broad-spectrum antibiotics Do not think bronchoscopy indicated at this point, if a malignancy were to be considered we need to treat an infection first Continue current pleural drainage however it is doubtful that current modality will be sufficient to completely drain the pleural space Do not think administration of TPA and DNase would be of value in this particular patient, defer to thoracic surgery about possibility of video-assisted thoracotomy Will await further recommendations per infectious disease and continue to follow  Erick Colace ACNP-BC [place] Pager # [phone] OR # [phone] if no answer' -   Disposition / Summary of Today's Plan 03/15/18   Continue antibiotics Follow-up cultures Await surgical evaluation and further feedback from infectious disease    Diet: Regular Pain/Anxiety/Delirium protocol (if indicated): Not indicated VAP protocol (if indicated): Not indicated DVT prophylaxis: Lovenox GI prophylaxis: Not indicated Hyperglycemia protocol: Not indicated Mobility: Out of bed Code Status: Full code Family Communication: Updated  Labs   CBC: Recent Labs  Lab 03/12/18 1241 03/14/18 1033 03/15/18 0500  WBC 14.3* 13.4*  9.5  NEUTROABS  --   --  7.1  HGB 13.5 14.9 12.4*  HCT 44.8 49.7 39.4  MCV 88.9 90.4 88.1  PLT 681* 633* 460*    Basic Metabolic Panel: Recent Labs  Lab 03/12/18 1241 03/14/18 1033 03/15/18 0500  NA 125* 131* 130*  K 4.0 4.7 3.8  CL 93* 91* 96*  CO2 23 29 28   GLUCOSE 162* 167* 97  BUN 6 12 11   CREATININE 0.76 1.10 0.85  CALCIUM 8.1* 8.9 8.2*   GFR: Estimated Creatinine Clearance: 84.8 mL/min (by C-G formula based on SCr of 0.85 mg/dL). Recent Labs  Lab 03/12/18 1241 03/14/18 1033 03/15/18 0500  WBC 14.3* 13.4* 9.5    Liver Function Tests: Recent Labs  Lab 03/12/18 1241  AST 16  ALT 8  ALKPHOS 65  BILITOT 0.7  PROT 8.4*  ALBUMIN 1.8*   No results for input(s): LIPASE, AMYLASE in the last 168 hours. No results for input(s): AMMONIA in the last 168 hours.  ABG No results found for: PHART, PCO2ART, PO2ART, HCO3, TCO2, ACIDBASEDEF, O2SAT   Coagulation Profile: Recent Labs  Lab 03/14/18 1339  INR 1.31    Cardiac Enzymes: No results for input(s): CKTOTAL, CKMB, CKMBINDEX, TROPONINI in the last 168 hours.  HbA1C: Hgb A1c MFr Bld  Date/Time Value Ref Range Status  03/04/2018 10:13 AM 4.9 4.8 - 5.6 % Final    Comment:    (NOTE) Pre diabetes:          5.7%-6.4% Diabetes:              >6.4% Glycemic control for   <7.0% adults with diabetes     CBG: No results for input(s): GLUCAP in the last 168 hours.  Admitting History of Present Illness.   57 year old male w/ sig h/o tobacco abuse and reflux disease initially seen by his primary care provider on November 10 with worsening fatigue, 50 pound weight loss, and cough with intermittent hemoptysis.  He had no recent travel, no TB exposures, no recent tick exposures.  CT chest was obtained which showed a large right pleural effusion worrisome for empyema and essentially destroyed RUL.  Also cystic-appearing spaces in the right lung worrisome for areas of cavitation.  There is also a left upper lobe nodule.   He was admitted for further evaluation but left against radical advice prior to therapy.  He was once again seen in the emergency room on 19 November complaining of more hemoptysis and weakness however once again left [place] he again presented to the ER on the 21st with now having right chest soreness White blood cell count of 13.4 and ongoing right pleural effusion.  He underwent successful chest tube placement by interventional radiology draining 850 cc purulent appearing fluid. PCCM asked to see for 1) possible bronch or 2) tpa & DNase therapy for VATs.   Review of Systems:   Review of Systems - History obtained from the patient General ROS: positive for  - fatigue, malaise and weight loss negative for - chills or fever Psychological ROS: negative ENT ROS: positive for - oral lesions negative for - epistaxis, sinus pain or sore throat Hematological and Lymphatic ROS: negative Endocrine ROS: negative Respiratory ROS: positive for - cough, hemoptysis and pleuritic pain negative for - orthopnea, shortness of breath, stridor, tachypnea or wheezing Cardiovascular ROS: no chest pain or dyspnea on exertion Gastrointestinal ROS: no abdominal pain, change in bowel habits, or black or bloody stools Genito-Urinary ROS: no dysuria, trouble voiding,  or hematuria Musculoskeletal ROS: negative Neurological ROS: no TIA or stroke symptoms  Past Medical History  He,  has a past medical history of Empyema ([place]) (03/03/2018; 03/14/2018) and GERD (gastroesophageal reflux disease).   Surgical History    Past Surgical History:  Procedure Laterality Date  . NO PAST SURGERIES       Social History   Social History   Socioeconomic History  . Marital status: Married    Spouse name: Not on file  . Number of children: Not on file  . Years of education: Not on file  . Highest education level: Not on file  Occupational History  . Not on file  Social Needs  . Financial resource strain:  Not on file  . Food insecurity:    Worry: Not on file    Inability: Not on file  . Transportation needs:    Medical: Not on file    Non-medical: Not on file  Tobacco Use  . Smoking status: Current Every Day Smoker    Packs/day: 1.00    Years: 45.00    Pack years: 45.00    Types: Cigarettes  . Smokeless tobacco: Never Used  Substance and Sexual Activity  . Alcohol use: Yes    Alcohol/week: 30.0 standard drinks    Types: 30 Cans of beer per week    Frequency: Never    Comment: 03/03/2018 "3-4 / day and more on weekends "  . Drug use: Never  . Sexual activity: Not Currently  Lifestyle  . Physical activity:    Days per week: Not on file    Minutes per session: Not on file  . Stress: Not on file  Relationships  . Social connections:    Talks on phone: Not on file    Gets together: Not on file    Attends religious service: Not on file    Active member of club or organization: Not on file    Attends meetings of clubs or organizations: Not on file    Relationship status: Not on file  . Intimate partner violence:    Fear of current or ex partner: Not on file    Emotionally abused: Not on file    Physically abused: Not on file    Forced sexual activity: Not on file  Other Topics Concern  . Not on file  Social History Narrative  . Not on file  ,  reports that he has been smoking cigarettes. He has a 45.00 pack-year smoking history. He has never used smokeless tobacco. He reports that he drinks about 30.0 standard drinks of alcohol per week. He reports that he does not use drugs.   Family History   His family history includes Alzheimer's disease in his father and mother.   Allergies No Known Allergies   Home Medications  Prior to Admission medications   Medication Sig Start Date End Date Taking? Authorizing Provider  hydrocortisone cream 1 % Place 1 application rectally as needed for itching.  02/21/18  Yes [provider]  naproxen sodium (ALEVE) 220 MG tablet  Take 960 mg by mouth daily as needed (pain).    Yes [provider]     Critical care time: NA    Attending Note:  57 year old male with PMH of etoh abuse and poor dentition who presents to the hospital with SOB and chest pain.  On exam, very poor dentition with virtually no BS on the right and clear on the left.  I reviewed CXR and  chest CT myself, right lung is essentially destroyed and left lung is hyperinflated.  Discussed with PCCM-NP.    Pleural effusion:  - IR placed catheter  - No DNAse for now, no tPA.  I suspect will develop a PTX given the amount of blebs there  - CT to suction  - CVTS to see  Necrotizing pneumonia:   - Zosyn  - F/U on cultures  - Check serum aspergillus ag  - Pleural adenosine deaminase  - Serum fungitel  - Per ID  Hemoptysis:  - No role for bronchoscopy at this time  - If hemoptysis recurs then will need IR to embolize, these cases of hemoptysis are usually bronchial artery source and bronchoscopy are less than helpful in treating.  Hypoxemia:  - Titrate O2 for sat of 88-92%  Etoh abuse:  - Cessation imperative as I suspect aspiration from poor dentition and alcohol abuse are the main contributing factor to his worsening over the past 2 years.  Poor dentition:  - Will need to see dentist as outpatient for extraction, see above  PCCM will follow to evaluate progress.  Patient seen and examined, agree with above note.  I dictated the care and orders written for this patient under my direction.  Rush Farmer, [place]

## 2018-03-15 NOTE — Progress Notes (Addendum)
Initial Nutrition Assessment  DOCUMENTATION CODES:   Non-severe (moderate) malnutrition in context of social or environmental circumstances  INTERVENTION:   -D/c Ensure Enlive po BID, each supplement provides 350 kcal and 20 grams of protein -MVI with minerals daily -Magic Cup TID with meals, each supplement provides 290 kcals and 9 grams protein -If poor po intake persists, consider initiation of nutrition support (however, doubt pt will agree)  NUTRITION DIAGNOSIS:   Moderate Malnutrition related to social / environmental circumstances as evidenced by energy intake < 75% for > or equal to 1 month, mild muscle depletion, moderate muscle depletion, mild fat depletion, moderate fat depletion.  GOAL:   Patient will meet greater than or equal to 90% of their needs  MONITOR:   PO intake, Supplement acceptance, Labs, Weight trends, Skin, I & O's  REASON FOR ASSESSMENT:   Malnutrition Screening Tool, Consult Assessment of nutrition requirement/status  ASSESSMENT:   Derrick Beltran is a 57 y.o. male with medical history significant of tobacco dependence with 10/10-11 admission for RUL destructive process concerning for empyema who left AMA prior to thoracentesis presenting with the same symptoms.  Of note, he returned on 10/19 with the same symptoms again and again left AMA.  He has been spitting up blood and mucus for about a year.  It started with mucus and then transitioned to blood maybe 6 months ago.  He is having weakness but not pain.  No fevers but some cold chills at night.  He continues to smoke 1ppd, no desire to quit, 45 pack years.    Pt admitted with rt empyema.   10/21- rt chest tube placed  Case discussed with RN, who reports that pt is refusing Ensure as he does not drink milk products. RN reports sputum cup remains in room to rule out TB.   Pt initially resting quietly at time of visit and did not respond to this RD when greeted. History obtained from pt wife at  bedside, who reports pt is a very picky eater. PTA, pt was consuming 1-2 meals per day- a snack during work hours and dinner meal of [employer] or fried fish. Pt wife reports that she has noticed progressive weight loss and decreased intake over the past year. Per her report, UBW is around 210#. Noted pt refused breakfast this morning (tray unattempted). Wife brought in [facility] meal, but pt also refused to eat it at this time. When asked why pt would not eat, he pointed to his IV and stated "because of this testing". Multiple unopened cokes and Ensure at bedside. Pt consumed about 75% of half gallon of sweet tea that wife brought in. Encouraged pt to eat in order to heal and build strength (even if it is outside food), however, pt continued to refuse.   Reviewed wt hx. Per CareEverywhere, last recorded wt was 184# on 07/10/17. No recent wt hx to assess. Pt indicates weight loss has been due to social stressors- indicated that pt's wife had severe health issues, which is what caused him to lose weight.   Again discussed with pt importance of good meal and supplement intake to promote healing. Pt reported "I ate some fish yesterday- that has protein in it". Pt refused all offers of supplements and snacks ("I don't even know why they brought that tray in there"). The only supplement pt will drink is "that immune one that heals from the inside that's on the TV"; neither this RD or pt's wife are sure about which supplement pt is  referencing. Encouraged pt wife to continue to bring outside food for pt.   Labs reviewed: Na: 130, K: 3.4.   NUTRITION - FOCUSED PHYSICAL EXAM:    Most Recent Value  Orbital Region  Moderate depletion  Upper Arm Region  Severe depletion  Thoracic and Lumbar Region  Mild depletion  Buccal Region  Mild depletion  Temple Region  Moderate depletion  Clavicle Bone Region  Severe depletion  Clavicle and Acromion Bone Region  Severe depletion  Scapular Bone Region  Severe depletion  Dorsal Hand   Moderate depletion  Patellar Region  Severe depletion  Anterior Thigh Region  Severe depletion  Posterior Calf Region  Severe depletion  Edema (RD Assessment)  None  Hair  Reviewed  Eyes  Reviewed  Mouth  Reviewed  Skin  Reviewed  Nails  Reviewed       Diet Order:   Diet Order            Diet regular Room service appropriate? Yes; Fluid consistency: Thin  Diet effective now              EDUCATION NEEDS:   Education needs have been addressed  Skin:  Skin Assessment: Skin Integrity Issues: Skin Integrity Issues:: Incisions Incisions: rt chest with chest tube  Last BM:  03/13/18  Height:   Ht Readings from Last 1 Encounters:  03/16/18 5\' 9"  (1.753 m)    Weight:   Wt Readings from Last 1 Encounters:  03/16/18 62.5 kg    Ideal Body Weight:  72.7 kg  BMI:  Body mass index is 20.35 kg/m.  Estimated Nutritional Needs:   Kcal:  2000-2200  Protein:  105-120 grams  Fluid:  > 2.0 L    Feliberto Stockley A. Jimmye Norman, RD, LDN, CDE Pager: [phone] After hours Pager: [phone]

## 2018-03-15 NOTE — Consult Note (Addendum)
Derrick Beltran       [place] [address]             [phone]        Derrick Beltran Medical Record #[id number] Date of Birth: 1960/08/05  Referring: No ref. provider found Primary Care: Delorse Limber Primary Cardiologist:No primary care provider on file.  Chief Complaint:    Chief Complaint  Patient presents with  . Back Pain  . Chest Pain    History of Present Illness:      Derrick Beltran is a 57 year old male patient with a past medical history significant for tobacco abuse, Alcohol use, and GERD who was sent to the hospital by his doctor's office to further evaluate an abnormal chest x-ray.  His original visit was on 03/03/2018 where he had been feeling very lethargic for the past several days.  He was having some hemoptysis and had lost about 100 pounds over the last year.  He did not have much of an appetite.  He was diagnosed with a complex pleural effusion consistent with empyema on the right side.  They had planned to place a image guided pigtail catheter, however the patient decided to leave AMA.  He returned to the emergency department on 03/12/2018 since he reported that his symptoms became worse and he was now ready for his thoracentesis.  He complained of upper back pain, chest soreness, and productive cough.  On 03/14/2017 he underwent a CT image guided drainage by percutaneous catheter and a placement of a right chest tube with interventional radiology.  They sent the fluid obtained for analysis.  The initial fluid evaluation showed abundant gram-negative rods and abundant white blood cells.  He has been placed on IV Zosyn and a nystatin suspension 4 times daily.  We have been consulted to offer our recommendations.     The patient does not want surgery and would rather have the "fluid drained off his lung so he can go back to work".                                                                                      Current Activity/  Functional Status: Patient was independent with mobility/ambulation, transfers, ADL's, IADL's.   Zubrod Score: At the time of surgery this patient's most appropriate activity status/level should be described as: []     0    Normal activity, no symptoms [x]     1    Restricted in physical strenuous activity but ambulatory, able to do out light work []     2    Ambulatory and capable of self care, unable to do work activities, up and about                 more than 50%  Of the time                            []     3    Only limited self care, in bed greater than 50% of waking hours []     4    Completely disabled,  no self care, confined to bed or chair []     5    Moribund  Past Medical History:  Diagnosis Date  . Empyema ([place]) 03/03/2018; 03/14/2018  . GERD (gastroesophageal reflux disease)     Past Surgical History:  Procedure Laterality Date  . NO PAST SURGERIES      Social History   Tobacco Use  Smoking Status Current Every Day Smoker  . Packs/day: 1.00  . Years: 45.00  . Pack years: 45.00  . Types: Cigarettes  Smokeless Tobacco Never Used    Social History   Substance and Sexual Activity  Alcohol Use Yes  . Alcohol/week: 30.0 standard drinks  . Types: 30 Cans of beer per week  . Frequency: Never   Comment: 03/03/2018 "3-4 / day and more on weekends "     No Known Allergies  Current Facility-Administered Medications  Medication Dose Route Frequency Provider Last Rate Last Dose  . 0.9 %  sodium chloride infusion   Intravenous Continuous Karmen Bongo, MD 75 mL/hr at 03/14/18 1808    . alum & mag hydroxide-simeth (MAALOX/MYLANTA) 200-200-20 MG/5ML suspension 30 mL  30 mL Oral Q6H PRN Karmen Bongo, MD   30 mL at 03/14/18 2021  . enoxaparin (LOVENOX) injection 40 mg  40 mg Subcutaneous Q24H Karmen Bongo, MD      . feeding supplement (ENSURE ENLIVE) (ENSURE ENLIVE) liquid 237 mL  237 mL Oral BID BM Karmen Bongo, MD   237 mL at 03/15/18 1331  . folic acid  (FOLVITE) tablet 1 mg  1 mg Oral Daily Karmen Bongo, MD   1 mg at 03/15/18 0817  . HYDROcodone-acetaminophen (NORCO/VICODIN) 5-325 MG per tablet 1-2 tablet  1-2 tablet Oral Q4H PRN Schorr, Rhetta Mura, NP   2 tablet at 03/15/18 1021  . lidocaine (LIDODERM) 5 % 1 patch  1 patch Transdermal Q24H Schorr, Rhetta Mura, NP   1 patch at 03/14/18 2124  . LORazepam (ATIVAN) tablet 1 mg  1 mg Oral Q6H PRN Karmen Bongo, MD       Or  . LORazepam (ATIVAN) injection 1 mg  1 mg Intravenous Q6H PRN Karmen Bongo, MD      . multivitamin with minerals tablet 1 tablet  1 tablet Oral Daily Karmen Bongo, MD   1 tablet at 03/15/18 0817  . nystatin (MYCOSTATIN) 100000 UNIT/ML suspension 500,000 Units  5 mL Oral QID Karmen Bongo, MD   500,000 Units at 03/15/18 1328  . piperacillin-tazobactam (ZOSYN) IVPB 3.375 g  3.375 g Intravenous Once Rumbarger, Rachel L, RPH 100 mL/hr at 03/15/18 1330 3.375 g at 03/15/18 1330  . piperacillin-tazobactam (ZOSYN) IVPB 3.375 g  3.375 g Intravenous Q8H Rumbarger, Rachel L, RPH 12.5 mL/hr at 03/15/18 1331 3.375 g at 03/15/18 1331  . thiamine (VITAMIN B-1) tablet 100 mg  100 mg Oral Daily Karmen Bongo, MD   100 mg at 03/15/18 3810   Or  . thiamine (B-1) injection 100 mg  100 mg Intravenous Daily Karmen Bongo, MD        Medications Prior to Admission  Medication Sig Dispense Refill Last Dose  . hydrocortisone cream 1 % Place 1 application rectally as needed for itching.    Past Week at Unknown time  . naproxen sodium (ALEVE) 220 MG tablet Take 960 mg by mouth daily as needed (pain).    03/13/2018 at Unknown time    Family History  Problem Relation Age of Onset  . Alzheimer's disease Mother   . Alzheimer's disease Father  Review of Systems:   Review of Systems  Constitutional: Positive for malaise/fatigue and weight loss. Negative for fever.  Respiratory: Positive for cough and sputum production. Negative for shortness of breath and wheezing.     Cardiovascular: Negative for chest pain and leg swelling.  Gastrointestinal: Negative for diarrhea, nausea and vomiting.   Pertinent items are noted in HPI.  Physical Exam: BP 98/66 (BP Location: Right Arm)   Pulse (!) 58   Temp (!) 97.5 F (36.4 C) (Oral)   Resp 20   SpO2 98%    General appearance: alert, cooperative and no distress Resp: diffuse rhonchi throughout Cardio: regular rate and rhythm, S1, S2 normal, no murmur, click, rub or gallop GI: soft, non-tender; bowel sounds normal; no masses,  no organomegaly Extremities: extremities normal, atraumatic, no cyanosis or edema Neurologic: Grossly normal  Diagnostic Studies & Laboratory data:     Recent Radiology Findings:   Dg Chest Port 1 View  Result Date: 03/15/2018 CLINICAL DATA:  Right chest tube. EXAM: PORTABLE CHEST 1 VIEW COMPARISON:  CT 03/14/2018, 03/03/2018.  Chest x-ray 12/21/2017. FINDINGS: Right chest tube noted over the right lung base in stable position. Known right hydropneumothorax appears to have decreased in size. No evidence of prominent recurrent effusion. Small right pleural effusion may be present. Severe right pleuroparenchymal thickening noted most consistent with scarring. Chronic pulmonary changes including bronchiectasis are again noted unchanged. Previously identified left pulmonary nodule best identified on prior recent CT, reference is made to prior recent CT of 03/03/2018. Heart size normal. No acute bony abnormality or changes thoracic spine IMPRESSION: 1. Right chest tube noted over the right lung base in stable position. Known right hydropneumothorax appears to be decreased in size. No evidence of prominent recurrent effusion. Small right pleural effusion may be present. 2. Severe right pleural-parenchymal thickening again noted consistent scarring. Chronic pulmonary changes including bronchiectasis are again noted in the right lung. No interim change. Previously identified left pulmonary nodule best  identified on prior recent CT, reference is made to prior chest CT of 03/03/2018. Electronically Signed   By: Marcello Moores  Register   On: 03/15/2018 07:52   Ct Image Guided Drainage By Percutaneous Catheter  Result Date: 03/14/2018 INDICATION: 57 year old with hemoptysis and probable right lung empyema. Patient presents for chest tube placement. EXAM: CT-GUIDED PLACEMENT OF RIGHT CHEST TUBE MEDICATIONS: Moderate sedation medication ANESTHESIA/SEDATION: Fentanyl 50 mcg IV; Versed 2 mg IV Moderate Sedation Time:  39 minutes The patient was continuously monitored during the procedure by the interventional radiology nurse under my direct supervision. COMPLICATIONS: None immediate. PROCEDURE: Informed written consent was obtained from the patient after a thorough discussion of the procedural risks, benefits and alternatives. All questions were addressed. Maximal Sterile Barrier Technique was utilized including caps, mask, sterile gowns, sterile gloves, sterile drape, hand hygiene and skin antiseptic. Procedure was also performed with respiratory droplet precautions. A timeout was performed prior to the initiation of the procedure. Patient was placed on his left side and CT images through the chest were obtained. The right mid axillary region was prepped and draped in sterile fashion. Skin and soft tissues were anesthetized with 1% lidocaine. Yueh catheter was directed into the pleural space and purulent thick yellow fluid was aspirated. A stiff Amplatz wire was placed. The tract was dilated to accommodate a 14 [place] multipurpose drain. Total of 850 mL of yellow purulent fluid was removed. Catheter was sutured to skin and attached to a PleurEvac. FINDINGS: Large fluid collection in the right lower chest. 850  mL of purulent fluid was removed from this cavity. Follow up CT images demonstrate a large hydropneumothorax following evacuation of most of the pleural fluid. Initial CT images again demonstrated bronchiectasis and  severe parenchymal disease in the right upper lung. Complex hydropneumothorax along the right upper lobe. Again noted is a mildly spiculated nodule in the left lung which was present on the previous diagnostic examination. IMPRESSION: CT-guided placement of a chest tube in the right lung empyema. 850 mL of yellow purulent fluid was removed. Fluid was sent for analysis. Electronically Signed   By: Markus Daft M.D.   On: 03/14/2018 18:01     I have independently reviewed the above radiologic studies and discussed with the patient   Recent Lab Findings: Lab Results  Component Value Date   WBC 9.5 03/15/2018   HGB 12.4 (L) 03/15/2018   HCT 39.4 03/15/2018   PLT 460 (H) 03/15/2018   GLUCOSE 97 03/15/2018   CHOL 87 03/04/2018   TRIG 72 03/04/2018   HDL 22 (L) 03/04/2018   LDLCALC 51 03/04/2018   ALT 8 03/12/2018   AST 16 03/12/2018   NA 130 (L) 03/15/2018   K 3.8 03/15/2018   CL 96 (L) 03/15/2018   CREATININE 0.85 03/15/2018   BUN 11 03/15/2018   CO2 28 03/15/2018   INR 1.31 03/14/2018   HGBA1C 4.9 03/04/2018      Assessment / Plan:      1. Right Empyema-CT-guided pigtail catheter placement with milky drainage in atrium. 856mL of purulent fluid was drained.  Patient states that he is feeling better and would like to go back to work as soon as possible. Being treated with Zosyn IV. Continue to follow cultures.  2. Tobacco abuse-refuses nicotine patch. Does not want to quit smoking.  3. Alcohol use-30 beers a week and more on weekends. On CIWA with ativan available for agitation 4. Lovenox for DVT prophylaxis  5. Periodontal disease  Plan: May be able to try thrombolytics in the pigtail catheter tube. Patient does not want surgery and wants to leave as soon as possible. Discussed plan of care with critical care.   I  spent 30 minutes counseling the patient face to face.   Nicholes Rough, PA-C 03/15/2018 1:36 PM Patient seen and examined, agree with above 49 yo man with chronic  bronchiectasis in RUL presented with a pleural effusion about 2 weeks ago and left AMA. Returns now. Pigtail catheter placed with drainage of purulent fluid c/w empyema. His lung is incompletely reexpanded. Will need to eliminate the space to heal the empyema. Options are long term tube drainage, thrombolytics and VATS. Thrombolytics may not be successful, but if they are could avoid surgery in a patient who clearly does not want it. Ultimately may require VATS but there is no guarantee that it will completely reexpand lung either.  Discussed options with patient. He wishes to attempt thrombolytics. Will start tomorrow. He understands he will need to remain hospitalized during this period.  Revonda Standard Roxan Hockey, MD [facility] [phone]

## 2018-03-16 LAB — COMPREHENSIVE METABOLIC PANEL
ALK PHOS: 50 U/L (ref 38–126)
ALT: 7 U/L (ref 0–44)
AST: 13 U/L — AB (ref 15–41)
Albumin: 1.5 g/dL — ABNORMAL LOW (ref 3.5–5.0)
Anion gap: 7 (ref 5–15)
BUN: 8 mg/dL (ref 6–20)
CALCIUM: 8 mg/dL — AB (ref 8.9–10.3)
CHLORIDE: 96 mmol/L — AB (ref 98–111)
CO2: 27 mmol/L (ref 22–32)
Creatinine, Ser: 0.77 mg/dL (ref 0.61–1.24)
Glucose, Bld: 108 mg/dL — ABNORMAL HIGH (ref 70–99)
Potassium: 3.4 mmol/L — ABNORMAL LOW (ref 3.5–5.1)
Sodium: 130 mmol/L — ABNORMAL LOW (ref 135–145)
Total Bilirubin: 0.5 mg/dL (ref 0.3–1.2)
Total Protein: 7.3 g/dL (ref 6.5–8.1)

## 2018-03-16 LAB — CBC
HEMATOCRIT: 39.7 % (ref 39.0–52.0)
Hemoglobin: 12.1 g/dL — ABNORMAL LOW (ref 13.0–17.0)
MCH: 27 pg (ref 26.0–34.0)
MCHC: 30.5 g/dL (ref 30.0–36.0)
MCV: 88.6 fL (ref 80.0–100.0)
PLATELETS: 497 10*3/uL — AB (ref 150–400)
RBC: 4.48 MIL/uL (ref 4.22–5.81)
RDW: 14.1 % (ref 11.5–15.5)
WBC: 8.1 10*3/uL (ref 4.0–10.5)
nRBC: 0 % (ref 0.0–0.2)

## 2018-03-16 LAB — ACID FAST SMEAR (AFB, MYCOBACTERIA): Acid Fast Smear: NEGATIVE

## 2018-03-16 LAB — QUANTIFERON-TB GOLD PLUS (RQFGPL)
QUANTIFERON MITOGEN VALUE: 0.03 [IU]/mL
QUANTIFERON TB1 AG VALUE: 0.04 [IU]/mL
QUANTIFERON TB2 AG VALUE: 0.03 [IU]/mL
QuantiFERON Nil Value: 0.03 IU/mL

## 2018-03-16 LAB — QUANTIFERON-TB GOLD PLUS: QUANTIFERON-TB GOLD PLUS: UNDETERMINED

## 2018-03-16 LAB — STREP PNEUMONIAE URINARY ANTIGEN: STREP PNEUMO URINARY ANTIGEN: NEGATIVE

## 2018-03-16 LAB — EXPECTORATED SPUTUM ASSESSMENT W REFEX TO RESP CULTURE: SPECIAL REQUESTS: NORMAL

## 2018-03-16 LAB — EXPECTORATED SPUTUM ASSESSMENT W GRAM STAIN, RFLX TO RESP C

## 2018-03-16 LAB — PH, BODY FLUID: pH, Body Fluid: 6.4

## 2018-03-16 LAB — OSMOLALITY, URINE: Osmolality, Ur: 174 mOsm/kg — ABNORMAL LOW (ref 300–900)

## 2018-03-16 MED ORDER — SODIUM CHLORIDE 0.9 % IJ SOLN
10.0000 mg | Freq: Two times a day (BID) | INTRAMUSCULAR | Status: AC
  Administered 2018-03-16 – 2018-03-18 (×3): 10 mg via INTRAPLEURAL
  Filled 2018-03-16 (×7): qty 10

## 2018-03-16 MED ORDER — STERILE WATER FOR INJECTION IJ SOLN
5.0000 mg | Freq: Two times a day (BID) | RESPIRATORY_TRACT | Status: AC
  Administered 2018-03-16 – 2018-03-18 (×3): 5 mg via INTRAPLEURAL
  Filled 2018-03-16 (×7): qty 5

## 2018-03-16 NOTE — Progress Notes (Signed)
Referring Physician(s): Dr Jeanie Sewer  Supervising Physician: Corrie Mckusick  Patient Status:  [facility] - In-pt  Chief Complaint:  Rt empyema  Subjective:  Drain placed 10/21 Actually feels some better today Breathing easier OP purulent 240 cc in Pleurvac--- purulent  CXR yesterday:  IMPRESSION: 1. Right chest tube noted over the right lung base in stable position. Known right hydropneumothorax appears to be decreased in size. No evidence of prominent recurrent effusion. Small right pleural effusion may be present  Allergies: Patient has no known allergies.  Medications: Prior to Admission medications   Medication Sig Start Date End Date Taking? Authorizing Provider  hydrocortisone cream 1 % Place 1 application rectally as needed for itching.  02/21/18  Yes [provider]  naproxen sodium (ALEVE) 220 MG tablet Take 960 mg by mouth daily as needed (pain).    Yes [provider]     Vital Signs: BP 102/68 (BP Location: Left Arm)   Pulse (!) 57   Temp 97.7 F (36.5 C) (Oral)   Resp 14   SpO2 98%   Physical Exam  Pulmonary/Chest: He has decreased breath sounds in the right upper field, the right middle field and the right lower field.  Skin: Skin is warm and dry.  Site of Chest tube ia clean and dry NT no bleeding No complaints of pain today OP purulent 240 cc in Pleurvac Wbc down NGTD Cx  Vitals reviewed.   Imaging: Dg Chest Port 1 View  Result Date: 03/15/2018 CLINICAL DATA:  Right chest tube. EXAM: PORTABLE CHEST 1 VIEW COMPARISON:  CT 03/14/2018, 03/03/2018.  Chest x-ray 12/21/2017. FINDINGS: Right chest tube noted over the right lung base in stable position. Known right hydropneumothorax appears to have decreased in size. No evidence of prominent recurrent effusion. Small right pleural effusion may be present. Severe right pleuroparenchymal thickening noted most consistent with scarring. Chronic pulmonary changes including bronchiectasis are  again noted unchanged. Previously identified left pulmonary nodule best identified on prior recent CT, reference is made to prior recent CT of 03/03/2018. Heart size normal. No acute bony abnormality or changes thoracic spine IMPRESSION: 1. Right chest tube noted over the right lung base in stable position. Known right hydropneumothorax appears to be decreased in size. No evidence of prominent recurrent effusion. Small right pleural effusion may be present. 2. Severe right pleural-parenchymal thickening again noted consistent scarring. Chronic pulmonary changes including bronchiectasis are again noted in the right lung. No interim change. Previously identified left pulmonary nodule best identified on prior recent CT, reference is made to prior chest CT of 03/03/2018. Electronically Signed   By: Marcello Moores  Register   On: 03/15/2018 07:52   Ct Image Guided Drainage By Percutaneous Catheter  Result Date: 03/14/2018 INDICATION: 57 year old with hemoptysis and probable right lung empyema. Patient presents for chest tube placement. EXAM: CT-GUIDED PLACEMENT OF RIGHT CHEST TUBE MEDICATIONS: Moderate sedation medication ANESTHESIA/SEDATION: Fentanyl 50 mcg IV; Versed 2 mg IV Moderate Sedation Time:  39 minutes The patient was continuously monitored during the procedure by the interventional radiology nurse under my direct supervision. COMPLICATIONS: None immediate. PROCEDURE: Informed written consent was obtained from the patient after a thorough discussion of the procedural risks, benefits and alternatives. All questions were addressed. Maximal Sterile Barrier Technique was utilized including caps, mask, sterile gowns, sterile gloves, sterile drape, hand hygiene and skin antiseptic. Procedure was also performed with respiratory droplet precautions. A timeout was performed prior to the initiation of the procedure. Patient was placed on his left  side and CT images through the chest were obtained. The right mid axillary  region was prepped and draped in sterile fashion. Skin and soft tissues were anesthetized with 1% lidocaine. Yueh catheter was directed into the pleural space and purulent thick yellow fluid was aspirated. A stiff Amplatz wire was placed. The tract was dilated to accommodate a 14 [place] multipurpose drain. Total of 850 mL of yellow purulent fluid was removed. Catheter was sutured to skin and attached to a PleurEvac. FINDINGS: Large fluid collection in the right lower chest. 850 mL of purulent fluid was removed from this cavity. Follow up CT images demonstrate a large hydropneumothorax following evacuation of most of the pleural fluid. Initial CT images again demonstrated bronchiectasis and severe parenchymal disease in the right upper lung. Complex hydropneumothorax along the right upper lobe. Again noted is a mildly spiculated nodule in the left lung which was present on the previous diagnostic examination. IMPRESSION: CT-guided placement of a chest tube in the right lung empyema. 850 mL of yellow purulent fluid was removed. Fluid was sent for analysis. Electronically Signed   By: Markus Daft M.D.   On: 03/14/2018 18:01    Labs:  CBC: Recent Labs    03/12/18 1241 03/14/18 1033 03/15/18 0500 03/16/18 0148  WBC 14.3* 13.4* 9.5 8.1  HGB 13.5 14.9 12.4* 12.1*  HCT 44.8 49.7 39.4 39.7  PLT 681* 633* 460* 497*    COAGS: Recent Labs    03/04/18 1015 03/14/18 1339  INR 1.28 1.31    BMP: Recent Labs    03/12/18 1241 03/14/18 1033 03/15/18 0500 03/16/18 0148  NA 125* 131* 130* 130*  K 4.0 4.7 3.8 3.4*  CL 93* 91* 96* 96*  CO2 23 29 28 27   GLUCOSE 162* 167* 97 108*  BUN 6 12 11 8   CALCIUM 8.1* 8.9 8.2* 8.0*  CREATININE 0.76 1.10 0.85 0.77  GFRNONAA >60 >60 >60 >60  GFRAA >60 >60 >60 >60    LIVER FUNCTION TESTS: Recent Labs    03/12/18 1241 03/16/18 0148  BILITOT 0.7 0.5  AST 16 13*  ALT 8 7  ALKPHOS 65 50  PROT 8.4* 7.3  ALBUMIN 1.8* 1.5*    Assessment and Plan:  Rt  Empyema Drain intact CXR better--- pt feeling better Will follow Plan per Dr Roxan Hockey  Electronically Signed: Monia Sabal A, PA-C 03/16/2018, 9:13 AM   I spent a total of 15 Minutes at the the patient's bedside AND on the patient's hospital floor or unit, greater than 50% of which was counseling/coordinating care for Rt Chest tube drain

## 2018-03-16 NOTE — Progress Notes (Signed)
[facility]  Date of Admission:  03/14/2018     Total days of antibiotics 3         ASSESSMENT/PLAN  Mr. Mahler continues to receive treatment for empyema of the right lung with chest tube continuing to drain purulent fluid. Pleural AFB smear negative with second AFB smear/culture pending. Will need 1 additional smear to rule out TB. Pleural fluid cultures remain pending with gram stain showing gram negative rods. Respiratory culture remains pending with gram stain result of gram positive cocci, gram variable rod and squamous epithelial cells. CVTS going to administer thrombolytics in attempts to avoid VATS. He is on Day 3 of piperacillin-tazobactam and tolerating with no adverse side effects.   1. Continue current dose of piperacillin-tazobactam. 2. Monitor cultures and continue airborne precautions at present. 3. Obtain 1 additional AFB culture which has been ordered.   Principal Problem:   Empyema ([place]) Active Problems:   Thrombocytosis (HCC)   Hyponatremia   Tobacco use   . alteplase (TPA) for intrapleural administration  10 mg Intrapleural Q12H   And  . pulmozyme (DORNASE) for intrapleural administration  5 mg Intrapleural Q12H  . folic acid  1 mg Oral Daily  . lidocaine  1 patch Transdermal Q24H  . multivitamin with minerals  1 tablet Oral Daily  . nystatin  5 mL Oral QID  . thiamine  100 mg Oral Daily   Or  . thiamine  100 mg Intravenous Daily    SUBJECTIVE:  Afebrile overnight with improving WBC count now normal. No issues overnight. Pleural fluid AFB smear negative. Second AFB smear/culture pending. Denies fevers, chills, or sweats.   No Known Allergies   Review of Systems: Review of Systems  Constitutional: Negative for chills and fever.  Respiratory: Positive for cough. Negative for hemoptysis, sputum production and wheezing.   Cardiovascular: Negative for chest pain and leg swelling.  Gastrointestinal: Negative for abdominal  pain, constipation, diarrhea, nausea and vomiting.  Skin: Negative for rash.      OBJECTIVE: Vitals:   03/16/18 0022 03/16/18 0514 03/16/18 1300 03/16/18 1603  BP: 102/64 102/68 121/69   Pulse: (!) 55 (!) 57 (!) 52   Resp: 16 14    Temp: 98.6 F (37 C) 97.7 F (36.5 C) (!) 97.5 F (36.4 C)   TempSrc: Oral Oral Oral   SpO2: 96% 98% 100%   Weight:    62.5 kg  Height:    5\' 9"  (1.753 m)   Body mass index is 20.35 kg/m.  Physical Exam  Constitutional: He is oriented to person, place, and time. He appears well-developed. He is cooperative. He appears ill. No distress.  Cardiovascular: Normal rate, regular rhythm, normal heart sounds and intact distal pulses. Exam reveals no gallop and no friction rub.  No murmur heard. Pulmonary/Chest: Effort normal and breath sounds normal. No stridor. No respiratory distress. He has no wheezes. He has no rales.  Chest tube remains patent with purulent drainage. There is 290 cc of fluid in the chamber.   Abdominal: Soft. Bowel sounds are normal.  Neurological: He is alert and oriented to person, place, and time.  Skin: Skin is warm and dry.  Psychiatric:  Flat, non-interactive affect    Lab Results Lab Results  Component Value Date   WBC 8.1 03/16/2018   HGB 12.1 (L) 03/16/2018   HCT 39.7 03/16/2018   MCV 88.6 03/16/2018   PLT 497 (H) 03/16/2018    Lab Results  Component Value Date  CREATININE 0.77 03/16/2018   BUN 8 03/16/2018   NA 130 (L) 03/16/2018   K 3.4 (L) 03/16/2018   CL 96 (L) 03/16/2018   CO2 27 03/16/2018    Lab Results  Component Value Date   ALT 7 03/16/2018   AST 13 (L) 03/16/2018   ALKPHOS 50 03/16/2018   BILITOT 0.5 03/16/2018     Microbiology: Recent Results (from the past 240 hour(s))  Culture, blood (routine x 2) Call MD if unable to obtain prior to antibiotics being given     Status: None (Preliminary result)   Collection Time: 03/14/18  3:44 PM  Result Value Ref Range Status   Specimen Description  BLOOD RIGHT ANTECUBITAL  Final   Special Requests   Final    BOTTLES DRAWN AEROBIC AND ANAEROBIC Blood Culture adequate volume   Culture   Final    NO GROWTH 2 DAYS Performed at [facility], [place] [address], [place], [place] [address]    Report Status PENDING  Incomplete  Culture, blood (routine x 2) Call MD if unable to obtain prior to antibiotics being given     Status: None (Preliminary result)   Collection Time: 03/14/18  3:55 PM  Result Value Ref Range Status   Specimen Description BLOOD LEFT ANTECUBITAL  Final   Special Requests   Final    BOTTLES DRAWN AEROBIC AND ANAEROBIC Blood Culture adequate volume   Culture   Final    NO GROWTH 2 DAYS Performed at [facility], [address] [place], [place] [address]    Report Status PENDING  Incomplete  Acid Fast Smear (AFB)     Status: None   Collection Time: 03/14/18  4:18 PM  Result Value Ref Range Status   AFB Specimen Processing Concentration  Final   Acid Fast Smear Negative  Final    Comment: (NOTE) Performed At: [facility] [place], [place] [id number] Rush Farmer MD JS:[id number]    Source (AFB) PLEURAL  Final    Comment: Performed at [facility], [place] [address], [place], [place] [address]  Aerobic/Anaerobic Culture (surgical/deep wound)     Status: None (Preliminary result)   Collection Time: 03/14/18  5:41 PM  Result Value Ref Range Status   Specimen Description ABSCESS  Final   Special Requests Normal  Final   Gram Stain   Final    ABUNDANT WBC PRESENT, PREDOMINANTLY PMN ABUNDANT GRAM NEGATIVE RODS    Culture   Final    NO GROWTH 2 DAYS NO ANAEROBES ISOLATED; CULTURE IN PROGRESS FOR 5 DAYS Performed at [facility], [place] [address], [place], [place] [address]    Report Status PENDING  Incomplete  Culture, sputum-assessment     Status: None   Collection Time: 03/14/18 10:44 PM  Result Value Ref Range Status   Specimen Description EXPECTORATED SPUTUM  Final    Special Requests Normal  Final   Sputum evaluation   Final    THIS SPECIMEN IS ACCEPTABLE FOR SPUTUM CULTURE Performed at [facility], [address] [place], [place] [address]    Report Status 03/16/2018 FINAL  Final  Culture, respiratory     Status: None (Preliminary result)   Collection Time: 03/14/18 10:44 PM  Result Value Ref Range Status   Specimen Description EXPECTORATED SPUTUM  Final   Special Requests Normal Reflexed from [id number]  Final   Gram Stain   Final    ABUNDANT WBC PRESENT,BOTH PMN AND  MONONUCLEAR FEW GRAM POSITIVE COCCI FEW GRAM VARIABLE ROD FEW SQUAMOUS EPITHELIAL CELLS PRESENT Performed at [facility], [place] [address], [place], [place] [address]    Culture PENDING  Incomplete   Report Status PENDING  Incomplete     Terri Piedra, NP [facility] [phone] Pager  03/16/2018  4:09 PM

## 2018-03-16 NOTE — Progress Notes (Signed)
PROGRESS NOTE    Derrick Beltran  TFT:[id number] DOB: 1960-07-02 DOA: 03/14/2018 PCP: [name], PA-C   Brief Narrative:  57 year old with past medical history of tobacco use, medical noncompliance came to the hospital about 2 weeks ago with complains of progressive weakness fatigue and hemoptysis.  At that time CT of the chest showed abnormal right upper lobe with right-sided pleural effusion.  There was plans for thoracentesis but patient ended up leaving [place].  He returned to the ER again on 10/21 with similar complaints and agreed to stay for the admission.  IR was consulted and CT-guided right-sided [place] drain was placed on 10/21.  Cardiothoracic surgery, pulmonology and infectious disease were consulted on 10/22.   Assessment & Plan:   Principal Problem:   Empyema ([place]) Active Problems:   Thrombocytosis (HCC)   Hyponatremia   Tobacco use  Right-sided pleural empyema with hydropneumothorax Right upper lobe bronchiectasis - CT-guided French drain in place 03/14/2018 draining purulent/serosanguineous fluid.  Plans to TPA this and to avoid surgery. - Gram stain shows gram-negative rods but rest of the cultures and cytology are pending -HiV is negative - Acid-fast culture-pending -Fungitell-pending -Aspergillus antibody-pending -Quantiferron TB - intermediate; repeat testing -pending -Infectious disease consulted, repeat sputum cultures ordered -Vancomycin discontinued, continue Zosyn - No plans for bronchoscopy at this point, malignancy eval can be performed outpatient on nonemergent basis once this acute illnesses over. - Urine strep is negative  Tobacco use - Counseled to quit using tobacco.  Alcohol use -Alcohol withdrawal protocol.  Multivitamins  Severe protein calorie malnutrition -Encourage oral intake  DVT prophylaxis: Discontinue Lovenox, place SCDs Code Status: Full code Family Communication: None at bedside Disposition Plan: Maintain  hospital stay for further care and treatment  Consultants:  Interventional radiology Infectious disease Cardiothoracic surgery Pulmonary critical care medicine  Procedures:   CT-guided French catheter placement in the chest 10/21  Antimicrobials:   IV Zosyn   Subjective: States he feels a little better no acute events overnight.  Review of Systems Otherwise negative except as per HPI, including: General: Denies fever, chills, night sweats or unintended weight loss. Resp: Denies cough, wheezing, shortness of breath. Cardiac: Denies chest pain, palpitations, orthopnea, paroxysmal nocturnal dyspnea. GI: Denies abdominal pain, nausea, vomiting, diarrhea or constipation GU: Denies dysuria, frequency, hesitancy or incontinence MS: Denies muscle aches, joint pain or swelling Neuro: Denies headache, neurologic deficits (focal weakness, numbness, tingling), abnormal gait Psych: Denies anxiety, depression, SI/HI/AVH Skin: Denies new rashes or lesions ID: Denies sick contacts, exotic exposures, travel  Objective: Vitals:   03/15/18 0533 03/15/18 1400 03/16/18 0022 03/16/18 0514  BP: 98/66 112/72 102/64 102/68  Pulse: (!) 58 60 (!) 55 (!) 57  Resp: 20 20 16 14   Temp: (!) 97.5 F (36.4 C) 98.2 F (36.8 C) 98.6 F (37 C) 97.7 F (36.5 C)  TempSrc: Oral Oral Oral Oral  SpO2: 98% 99% 96% 98%    Intake/Output Summary (Last 24 hours) at 03/16/2018 1202 Last data filed at 03/16/2018 7062 Gross per 24 hour  Intake 262.1 ml  Output 680 ml  Net -417.9 ml   There were no vitals filed for this visit.  Examination:  General exam: Appears calm and comfortable, overall thin Respiratory system: Bibasilar crackles right-sided chest tube Cardiovascular system: S1 & S2 heard, RRR. No JVD, murmurs, rubs, gallops or clicks. No pedal edema. Gastrointestinal system: Abdomen is nondistended, soft and nontender. No organomegaly or masses felt. Normal bowel sounds heard. Central nervous  system: Alert and oriented.  No focal neurological deficits. Extremities: Symmetric 5 x 5 power. Skin: No rashes, lesions or ulcers Psychiatry: Judgement and insight appear normal. Mood & affect appropriate.  Right chest wall drain is in place.   Data Reviewed:   CBC: Recent Labs  Lab 03/12/18 1241 03/14/18 1033 03/15/18 0500 03/16/18 0148  WBC 14.3* 13.4* 9.5 8.1  NEUTROABS  --   --  7.1  --   HGB 13.5 14.9 12.4* 12.1*  HCT 44.8 49.7 39.4 39.7  MCV 88.9 90.4 88.1 88.6  PLT 681* 633* 460* 712*   Basic Metabolic Panel: Recent Labs  Lab 03/12/18 1241 03/14/18 1033 03/15/18 0500 03/16/18 0148  NA 125* 131* 130* 130*  K 4.0 4.7 3.8 3.4*  CL 93* 91* 96* 96*  CO2 23 29 28 27   GLUCOSE 162* 167* 97 108*  BUN 6 12 11 8   CREATININE 0.76 1.10 0.85 0.77  CALCIUM 8.1* 8.9 8.2* 8.0*   GFR: Estimated Creatinine Clearance: 90.1 mL/min (by C-G formula based on SCr of 0.77 mg/dL). Liver Function Tests: Recent Labs  Lab 03/12/18 1241 03/16/18 0148  AST 16 13*  ALT 8 7  ALKPHOS 65 50  BILITOT 0.7 0.5  PROT 8.4* 7.3  ALBUMIN 1.8* 1.5*   No results for input(s): LIPASE, AMYLASE in the last 168 hours. No results for input(s): AMMONIA in the last 168 hours. Coagulation Profile: Recent Labs  Lab 03/14/18 1339  INR 1.31   Cardiac Enzymes: No results for input(s): CKTOTAL, CKMB, CKMBINDEX, TROPONINI in the last 168 hours. BNP (last 3 results) No results for input(s): PROBNP in the last 8760 hours. HbA1C: No results for input(s): HGBA1C in the last 72 hours. CBG: No results for input(s): GLUCAP in the last 168 hours. Lipid Profile: No results for input(s): CHOL, HDL, LDLCALC, TRIG, CHOLHDL, LDLDIRECT in the last 72 hours. Thyroid Function Tests: No results for input(s): TSH, T4TOTAL, FREET4, T3FREE, THYROIDAB in the last 72 hours. Anemia Panel: No results for input(s): VITAMINB12, FOLATE, FERRITIN, TIBC, IRON, RETICCTPCT in the last 72 hours. Sepsis Labs: No results for  input(s): PROCALCITON, LATICACIDVEN in the last 168 hours.  Recent Results (from the past 240 hour(s))  Culture, blood (routine x 2) Call MD if unable to obtain prior to antibiotics being given     Status: None (Preliminary result)   Collection Time: 03/14/18  3:44 PM  Result Value Ref Range Status   Specimen Description BLOOD RIGHT ANTECUBITAL  Final   Special Requests   Final    BOTTLES DRAWN AEROBIC AND ANAEROBIC Blood Culture adequate volume   Culture   Final    NO GROWTH 2 DAYS Performed at [facility], [address], [place], [place] [address]    Report Status PENDING  Incomplete  Culture, blood (routine x 2) Call MD if unable to obtain prior to antibiotics being given     Status: None (Preliminary result)   Collection Time: 03/14/18  3:55 PM  Result Value Ref Range Status   Specimen Description BLOOD LEFT ANTECUBITAL  Final   Special Requests   Final    BOTTLES DRAWN AEROBIC AND ANAEROBIC Blood Culture adequate volume   Culture   Final    NO GROWTH 2 DAYS Performed at [facility], [address] [place], [place] [address]    Report Status PENDING  Incomplete  Acid Fast Smear (AFB)     Status: None   Collection Time: 03/14/18  4:18 PM  Result Value Ref Range Status  AFB Specimen Processing Concentration  Final   Acid Fast Smear Negative  Final    Comment: (NOTE) Performed At: [facility] [place], [place] [id number] Rush Farmer MD ME:[id number]    Source (AFB) PLEURAL  Final    Comment: Performed at [facility], [place] [address] [place], [place] [address]  Aerobic/Anaerobic Culture (surgical/deep wound)     Status: None (Preliminary result)   Collection Time: 03/14/18  5:41 PM  Result Value Ref Range Status   Specimen Description ABSCESS  Final   Special Requests Normal  Final   Gram Stain   Final    ABUNDANT WBC PRESENT, PREDOMINANTLY PMN ABUNDANT GRAM NEGATIVE RODS    Culture   Final    NO GROWTH 2 DAYS NO  ANAEROBES ISOLATED; CULTURE IN PROGRESS FOR 5 DAYS Performed at [facility], [place] [address], [place], [place] [address]    Report Status PENDING  Incomplete  Culture, sputum-assessment     Status: None   Collection Time: 03/14/18 10:44 PM  Result Value Ref Range Status   Specimen Description EXPECTORATED SPUTUM  Final   Special Requests Normal  Final   Sputum evaluation   Final    THIS SPECIMEN IS ACCEPTABLE FOR SPUTUM CULTURE Performed at [facility], [address] [place], [place] [address]    Report Status 03/16/2018 FINAL  Final  Culture, respiratory     Status: None (Preliminary result)   Collection Time: 03/14/18 10:44 PM  Result Value Ref Range Status   Specimen Description EXPECTORATED SPUTUM  Final   Special Requests Normal Reflexed from [id number]  Final   Gram Stain   Final    ABUNDANT WBC PRESENT,BOTH PMN AND MONONUCLEAR FEW GRAM POSITIVE COCCI FEW GRAM VARIABLE ROD FEW SQUAMOUS EPITHELIAL CELLS PRESENT Performed at [facility], [place] [address] [place],  [address]    Culture PENDING  Incomplete   Report Status PENDING  Incomplete         Radiology Studies: Dg Chest Port 1 View  Result Date: 03/15/2018 CLINICAL DATA:  Right chest tube. EXAM: PORTABLE CHEST 1 VIEW COMPARISON:  CT 03/14/2018, 03/03/2018.  Chest x-ray 12/21/2017. FINDINGS: Right chest tube noted over the right lung base in stable position. Known right hydropneumothorax appears to have decreased in size. No evidence of prominent recurrent effusion. Small right pleural effusion may be present. Severe right pleuroparenchymal thickening noted most consistent with scarring. Chronic pulmonary changes including bronchiectasis are again noted unchanged. Previously identified left pulmonary nodule best identified on prior recent CT, reference is made to prior recent CT of 03/03/2018. Heart size normal. No acute bony abnormality or changes thoracic spine IMPRESSION: 1. Right chest tube  noted over the right lung base in stable position. Known right hydropneumothorax appears to be decreased in size. No evidence of prominent recurrent effusion. Small right pleural effusion may be present. 2. Severe right pleural-parenchymal thickening again noted consistent scarring. Chronic pulmonary changes including bronchiectasis are again noted in the right lung. No interim change. Previously identified left pulmonary nodule best identified on prior recent CT, reference is made to prior chest CT of 03/03/2018. Electronically Signed   By: Marcello Moores  Register   On: 03/15/2018 07:52   Ct Image Guided Drainage By Percutaneous Catheter  Result Date: 03/14/2018 INDICATION: 57 year old with hemoptysis and probable right lung empyema. Patient presents for chest tube placement. EXAM: CT-GUIDED PLACEMENT OF RIGHT CHEST TUBE MEDICATIONS: Moderate sedation medication ANESTHESIA/SEDATION: Fentanyl 50 mcg IV; Versed  2 mg IV Moderate Sedation Time:  39 minutes The patient was continuously monitored during the procedure by the interventional radiology nurse under my direct supervision. COMPLICATIONS: None immediate. PROCEDURE: Informed written consent was obtained from the patient after a thorough discussion of the procedural risks, benefits and alternatives. All questions were addressed. Maximal Sterile Barrier Technique was utilized including caps, mask, sterile gowns, sterile gloves, sterile drape, hand hygiene and skin antiseptic. Procedure was also performed with respiratory droplet precautions. A timeout was performed prior to the initiation of the procedure. Patient was placed on his left side and CT images through the chest were obtained. The right mid axillary region was prepped and draped in sterile fashion. Skin and soft tissues were anesthetized with 1% lidocaine. Yueh catheter was directed into the pleural space and purulent thick yellow fluid was aspirated. A stiff Amplatz wire was placed. The tract was dilated  to accommodate a 14 [place] multipurpose drain. Total of 850 mL of yellow purulent fluid was removed. Catheter was sutured to skin and attached to a PleurEvac. FINDINGS: Large fluid collection in the right lower chest. 850 mL of purulent fluid was removed from this cavity. Follow up CT images demonstrate a large hydropneumothorax following evacuation of most of the pleural fluid. Initial CT images again demonstrated bronchiectasis and severe parenchymal disease in the right upper lung. Complex hydropneumothorax along the right upper lobe. Again noted is a mildly spiculated nodule in the left lung which was present on the previous diagnostic examination. IMPRESSION: CT-guided placement of a chest tube in the right lung empyema. 850 mL of yellow purulent fluid was removed. Fluid was sent for analysis. Electronically Signed   By: Markus Daft M.D.   On: 03/14/2018 18:01        Scheduled Meds: . alteplase (TPA) for intrapleural administration  10 mg Intrapleural Q12H   And  . pulmozyme (DORNASE) for intrapleural administration  5 mg Intrapleural Q12H  . enoxaparin (LOVENOX) injection  40 mg Subcutaneous Q24H  . feeding supplement (ENSURE ENLIVE)  237 mL Oral BID BM  . folic acid  1 mg Oral Daily  . lidocaine  1 patch Transdermal Q24H  . multivitamin with minerals  1 tablet Oral Daily  . nystatin  5 mL Oral QID  . thiamine  100 mg Oral Daily   Or  . thiamine  100 mg Intravenous Daily   Continuous Infusions: . piperacillin-tazobactam (ZOSYN)  IV 3.375 g (03/16/18 0643)     LOS: 2 days   Time spent= 40 mins    Ankit Arsenio Loader, MD [facility] Pager [phone]   If 7PM-7AM, please contact night-coverage [email or web address] Password [email or web address] 03/16/2018, 12:02 PM

## 2018-03-16 NOTE — Progress Notes (Addendum)
      [address]       [place] [address]             [phone]     BP 102/68 (BP Location: Left Arm)   Pulse (!) 57   Temp 97.7 F (36.5 C) (Oral)   Resp 14   SpO2 98%    Brief procedure : Antelpase/Doranse thrombolytic infused in right chest drain per usual technique. Patient tolerated the procedure well  John Giovanni, PA-C   Patient seen and examined. In a bad mood, minimally interactive BP 121/69 (BP Location: Left Arm)   Pulse (!) 52   Temp (!) 97.5 F (36.4 C) (Oral)   Resp 14   SpO2 100%   Intake/Output Summary (Last 24 hours) at 03/16/2018 1531 Last data filed at 03/16/2018 1510 Gross per 24 hour  Intake 202.1 ml  Output 1630 ml  Net -1427.9 ml   Will check CXR in AM  Steven C. Roxan Hockey, MD [facility] [phone]

## 2018-03-16 NOTE — Progress Notes (Signed)
CVTS attempting thrombolytics for empyema.  PCCM will s/o Call back if ID feels bronch needed.  Any evaluation for cancer will need to occur after acute illness resolved.   Erick Colace ACNP-BC [place] Pager # [phone] OR # [phone] if no answer

## 2018-03-17 ENCOUNTER — Inpatient Hospital Stay (HOSPITAL_COMMUNITY): Payer: No Typology Code available for payment source

## 2018-03-17 DIAGNOSIS — E440 Moderate protein-calorie malnutrition: Secondary | ICD-10-CM

## 2018-03-17 LAB — EXPECTORATED SPUTUM ASSESSMENT W GRAM STAIN, RFLX TO RESP C

## 2018-03-17 LAB — FUNGITELL, SERUM

## 2018-03-17 LAB — EXPECTORATED SPUTUM ASSESSMENT W REFEX TO RESP CULTURE

## 2018-03-17 MED ORDER — LORAZEPAM 1 MG PO TABS
2.0000 mg | ORAL_TABLET | Freq: Once | ORAL | Status: AC
  Administered 2018-03-17: 2 mg via ORAL

## 2018-03-17 MED ORDER — LORAZEPAM 2 MG/ML IJ SOLN
0.5000 mg | Freq: Four times a day (QID) | INTRAMUSCULAR | Status: AC | PRN
  Administered 2018-03-17 – 2018-03-18 (×2): 0.5 mg via INTRAVENOUS
  Filled 2018-03-17 (×2): qty 1

## 2018-03-17 NOTE — Progress Notes (Signed)
Pt pulled out PIV and pulled apart chest tube. Chest tube cleaned and reattached. Pt states he, "has to go somewhere". Reoriented and helped back to bed. Will continue to monitor CIWA. MD notified.

## 2018-03-17 NOTE — Progress Notes (Signed)
Brief Progress Note:  Cultures reviewed with 10/21 respiratory cultures re-incubated for better growth. First AFB smear negative. Unfortunately an AFB smear was not ordered on 10/22. 10/24 remains pending. Afebrile and appears to have symptom improvement. Continue current dose of piperacillin-tazobactam pending culture results. Will re-order AFB smear and culture if able to produce.

## 2018-03-17 NOTE — Progress Notes (Signed)
Referring Physician(s): Dr. Reesa Chew  Supervising Physician: Sandi Mariscal  Patient Status:  [facility] - In-pt  Chief Complaint: Follow-up right chest tube placement 10/21 with Dr. Anselm Pancoast for empyema.  Subjective:  Patient states he feels better, breathing has improved. He states appetite is poor but has been eating some. He states he would like to go home tomorrow. He denies pain, n/v, dyspnea.  Per chart anteplase/doranse infusion performed yesterday and today in right chest tube in attempt to avoid VATS procedure. On airborne precautions.  Allergies: Patient has no known allergies.  Medications: Prior to Admission medications   Medication Sig Start Date End Date Taking? Authorizing Provider  hydrocortisone cream 1 % Place 1 application rectally as needed for itching.  02/21/18  Yes [provider]  naproxen sodium (ALEVE) 220 MG tablet Take 960 mg by mouth daily as needed (pain).    Yes [provider]     Vital Signs: BP 115/68 (BP Location: Right Arm)   Pulse 64   Temp 97.6 F (36.4 C) (Oral)   Resp (!) 22   Ht 5\' 9"  (1.753 m)   Wt 137 lb 12.6 oz (62.5 kg)   SpO2 97%   BMI 20.35 kg/m   Physical Exam  Constitutional: No distress.  Visitor in room during exam  HENT:  Head: Normocephalic.  Cardiovascular: Normal rate, regular rhythm and normal heart sounds.  Pulmonary/Chest: Effort normal and breath sounds normal.  Right pigtail chest tube in place; insertion clean, dry, intact. ~800 cc serosanguineous output in PleurVac  Abdominal: Soft. He exhibits no distension.  Neurological: He is alert.  Skin: Skin is warm and dry. He is not diaphoretic.  Psychiatric: He has a normal mood and affect. His behavior is normal. Judgment and thought content normal.  Nursing note and vitals reviewed.   Imaging: Dg Chest Port 1 View  Result Date: 03/17/2018 CLINICAL DATA:  Empyema of right lung. EXAM: PORTABLE CHEST 1 VIEW COMPARISON:  Radiograph of March 15, 2018.  FINDINGS: Stable cardiomediastinal silhouette. No pneumothorax is noted. Stable position of right basilar pleural drainage catheter. Continued presence of right basilar pneumothorax is noted most likely due to incomplete re-expansion of right lung due to cortical scarring and long-standing pleural effusion. Stable diffuse right lung densities are noted consistent with chronic scarring or infection. Bony thorax is unremarkable. IMPRESSION: Stable position of right basilar pleural drainage catheter. Slightly decreased right basilar pneumothorax is noted. Stable right lung findings as described above. Electronically Signed   By: Marijo Conception, M.D.   On: 03/17/2018 10:20   Dg Chest Port 1 View  Result Date: 03/15/2018 CLINICAL DATA:  Right chest tube. EXAM: PORTABLE CHEST 1 VIEW COMPARISON:  CT 03/14/2018, 03/03/2018.  Chest x-ray 12/21/2017. FINDINGS: Right chest tube noted over the right lung base in stable position. Known right hydropneumothorax appears to have decreased in size. No evidence of prominent recurrent effusion. Small right pleural effusion may be present. Severe right pleuroparenchymal thickening noted most consistent with scarring. Chronic pulmonary changes including bronchiectasis are again noted unchanged. Previously identified left pulmonary nodule best identified on prior recent CT, reference is made to prior recent CT of 03/03/2018. Heart size normal. No acute bony abnormality or changes thoracic spine IMPRESSION: 1. Right chest tube noted over the right lung base in stable position. Known right hydropneumothorax appears to be decreased in size. No evidence of prominent recurrent effusion. Small right pleural effusion may be present. 2. Severe right pleural-parenchymal thickening again noted consistent scarring. Chronic  pulmonary changes including bronchiectasis are again noted in the right lung. No interim change. Previously identified left pulmonary nodule best identified on prior recent  CT, reference is made to prior chest CT of 03/03/2018. Electronically Signed   By: Marcello Moores  Register   On: 03/15/2018 07:52   Ct Image Guided Drainage By Percutaneous Catheter  Result Date: 03/14/2018 INDICATION: 57 year old with hemoptysis and probable right lung empyema. Patient presents for chest tube placement. EXAM: CT-GUIDED PLACEMENT OF RIGHT CHEST TUBE MEDICATIONS: Moderate sedation medication ANESTHESIA/SEDATION: Fentanyl 50 mcg IV; Versed 2 mg IV Moderate Sedation Time:  39 minutes The patient was continuously monitored during the procedure by the interventional radiology nurse under my direct supervision. COMPLICATIONS: None immediate. PROCEDURE: Informed written consent was obtained from the patient after a thorough discussion of the procedural risks, benefits and alternatives. All questions were addressed. Maximal Sterile Barrier Technique was utilized including caps, mask, sterile gowns, sterile gloves, sterile drape, hand hygiene and skin antiseptic. Procedure was also performed with respiratory droplet precautions. A timeout was performed prior to the initiation of the procedure. Patient was placed on his left side and CT images through the chest were obtained. The right mid axillary region was prepped and draped in sterile fashion. Skin and soft tissues were anesthetized with 1% lidocaine. Yueh catheter was directed into the pleural space and purulent thick yellow fluid was aspirated. A stiff Amplatz wire was placed. The tract was dilated to accommodate a 14 [place] multipurpose drain. Total of 850 mL of yellow purulent fluid was removed. Catheter was sutured to skin and attached to a PleurEvac. FINDINGS: Large fluid collection in the right lower chest. 850 mL of purulent fluid was removed from this cavity. Follow up CT images demonstrate a large hydropneumothorax following evacuation of most of the pleural fluid. Initial CT images again demonstrated bronchiectasis and severe parenchymal disease  in the right upper lung. Complex hydropneumothorax along the right upper lobe. Again noted is a mildly spiculated nodule in the left lung which was present on the previous diagnostic examination. IMPRESSION: CT-guided placement of a chest tube in the right lung empyema. 850 mL of yellow purulent fluid was removed. Fluid was sent for analysis. Electronically Signed   By: Markus Daft M.D.   On: 03/14/2018 18:01    Labs:  CBC: Recent Labs    03/12/18 1241 03/14/18 1033 03/15/18 0500 03/16/18 0148  WBC 14.3* 13.4* 9.5 8.1  HGB 13.5 14.9 12.4* 12.1*  HCT 44.8 49.7 39.4 39.7  PLT 681* 633* 460* 497*    COAGS: Recent Labs    03/04/18 1015 03/14/18 1339  INR 1.28 1.31    BMP: Recent Labs    03/12/18 1241 03/14/18 1033 03/15/18 0500 03/16/18 0148  NA 125* 131* 130* 130*  K 4.0 4.7 3.8 3.4*  CL 93* 91* 96* 96*  CO2 23 29 28 27   GLUCOSE 162* 167* 97 108*  BUN 6 12 11 8   CALCIUM 8.1* 8.9 8.2* 8.0*  CREATININE 0.76 1.10 0.85 0.77  GFRNONAA >60 >60 >60 >60  GFRAA >60 >60 >60 >60    LIVER FUNCTION TESTS: Recent Labs    03/12/18 1241 03/16/18 0148  BILITOT 0.7 0.5  AST 16 13*  ALT 8 7  ALKPHOS 65 50  PROT 8.4* 7.3  ALBUMIN 1.8* 1.5*    Assessment and Plan:  S/p right chest tube placement 10/21 with Dr. Anselm Pancoast due to empyema. ~800 cc serosanguineous output on exam today; last recorded 370 cc yesterday. Anteplase/doranse infusion per cardiothoracic  surgery yesterday and today - possibly for VATS in the future if not improved with infusion. Patient symptomatically improved - afebrile, WBC 8.1 yesterday, he continues on Zosyn.   Continue current management, record output daily - further plans per cardiothoracic surgery.   Electronically Signed: Joaquim Nam, PA-C 03/17/2018, 3:29 PM   I spent a total of 15 Minutes at the the patient's bedside AND on the patient's hospital floor or unit, greater than 50% of which was counseling/coordinating care for right chest  tube.

## 2018-03-17 NOTE — Progress Notes (Signed)
PROGRESS NOTE    Derrick Beltran  KZL:[id number] DOB: [birth year] DOA: 03/14/2018 PCP: [name], PA-C   Brief Narrative:  57 year old with past medical history of tobacco use, medical noncompliance came to the hospital about 2 weeks ago with complains of progressive weakness fatigue and hemoptysis.  At that time CT of the chest showed abnormal right upper lobe with right-sided pleural effusion.  There was plans for thoracentesis but patient ended up leaving [place].  He returned to the ER again on 10/21 with similar complaints and agreed to stay for the admission.  IR was consulted and CT-guided right-sided [place] drain was placed on 10/21.  Cardiothoracic surgery, pulmonology and infectious disease were consulted on 10/22.   Assessment & Plan:   Principal Problem:   Empyema ([place]) Active Problems:   Thrombocytosis (H[facility])   Hyponatremia   Tobacco use   Malnutrition of moderate degree  Right-sided pleural empyema with hydropneumothorax Right upper lobe bronchiectasis - Chest tube is in place since 03/14/2018 draining purulent/serosanguineous fluid.  TPA performed by cardiothoracic surgery.  Hoping to get some results this way and to avoid VATS procedure as patient is also reluctant to do this. -Chest x-ray from 10/24- stable position of pleural catheter, slightly decreased right basilar pneumothorax, stable right lung findings. - Gram stain shows gram-negative rods but rest of the cultures and cytology are pending -HiV is negative - Acid-fast culture-pending -Fungitell-pending -Aspergillus antibody-pending.  -Quantiferron TB - intermediate; repeat testing -pending -Infectious disease consulted, repeat sputum cultures ordered -Vancomycin discontinued, continue Zosyn - No plans for bronchoscopy at this point, malignancy eval can be performed outpatient on nonemergent basis once this acute illnesses over. - Urine strep is negative  Tobacco use - Counseled to quit  using tobacco.  Alcohol use -Alcohol withdrawal protocol.  Multivitamins  Severe protein calorie malnutrition -Encourage oral intake  DVT prophylaxis: SCDs Code Status: Full code Family Communication: None at bedside Disposition Plan: Maintain inpatient stay at this time.  Consultants:  Interventional radiology Infectious disease Cardiothoracic surgery Pulmonary critical care medicine  Procedures:   CT-guided French catheter placement in the chest 10/21  Antimicrobials:   IV Zosyn   Subjective: Patient is upset that he is to stay in the hospital longer and await for these results.  He wants to go home but have explained to him the importance of staying in the hospital and getting this appropriately treated.  At this time he understands for now and is willing to stay.  Otherwise denies any complaints  Review of Systems Otherwise negative except as per HPI, including: General = no fevers, chills, dizziness, malaise, fatigue HEENT/EYES = negative for pain, redness, loss of vision, double vision, blurred vision, loss of hearing, sore throat, hoarseness, dysphagia Cardiovascular= negative for chest pain, palpitation, murmurs, lower extremity swelling Respiratory/lungs= negative for shortness of breath, cough, hemoptysis, wheezing, mucus production Gastrointestinal= negative for nausea, vomiting,, abdominal pain, melena, hematemesis Genitourinary= negative for Dysuria, Hematuria, Change in Urinary Frequency MSK = Negative for arthralgia, myalgias, Back Pain, Joint swelling  Neurology= Negative for headache, seizures, numbness, tingling  Psychiatry= Negative for anxiety, depression, suicidal and homocidal ideation Allergy/Immunology= Medication/Food allergy as listed  Skin= Negative for Rash, lesions, ulcers, itching   Objective: Vitals:   03/16/18 1300 03/16/18 1603 03/16/18 2231 03/17/18 0542  BP: 121/69  114/68 115/68  Pulse: (!) 52  (!) 52 64  Resp:   18 (!) 22  Temp:  (!) 97.5 F (36.4 C)  97.8 F (36.6 C) 97.6 F (36.4  C)  TempSrc: Oral  Oral Oral  SpO2: 100%  98% 97%  Weight:  62.5 kg    Height:  5\' 9"  (1.753 m)      Intake/Output Summary (Last 24 hours) at 03/17/2018 1104 Last data filed at 03/17/2018 1050 Gross per 24 hour  Intake 330.21 ml  Output 2565 ml  Net -2234.79 ml   Filed Weights   03/16/18 1603  Weight: 62.5 kg    Examination:  Constitutional: NAD, calm, comfortable Eyes: PERRL, lids and conjunctivae normal ENMT: Mucous membranes are moist. Posterior pharynx clear of any exudate or lesions.Normal dentition.  Neck: normal, supple, no masses, no thyromegaly Respiratory: Diminished breath sounds at the bases Cardiovascular: Regular rate and rhythm, no murmurs / rubs / gallops. No extremity edema. 2+ pedal pulses. No carotid bruits.  Abdomen: no tenderness, no masses palpated. No hepatosplenomegaly. Bowel sounds positive.  Musculoskeletal: no clubbing / cyanosis. No joint deformity upper and lower extremities. Good ROM, no contractures. Normal muscle tone.  Skin: no rashes, lesions, ulcers. No induration Neurologic: CN 2-12 grossly intact. Sensation intact, DTR normal. Strength 5/5 in all 4.  Psychiatric: Normal judgment and insight. Alert and oriented x 3. Normal mood.  Chest tube site noted.   Data Reviewed:   CBC: Recent Labs  Lab 03/12/18 1241 03/14/18 1033 03/15/18 0500 03/16/18 0148  WBC 14.3* 13.4* 9.5 8.1  NEUTROABS  --   --  7.1  --   HGB 13.5 14.9 12.4* 12.1*  HCT 44.8 49.7 39.4 39.7  MCV 88.9 90.4 88.1 88.6  PLT 681* 633* 460* 381*   Basic Metabolic Panel: Recent Labs  Lab 03/12/18 1241 03/14/18 1033 03/15/18 0500 03/16/18 0148  NA 125* 131* 130* 130*  K 4.0 4.7 3.8 3.4*  CL 93* 91* 96* 96*  CO2 23 29 28 27   GLUCOSE 162* 167* 97 108*  BUN 6 12 11 8   CREATININE 0.76 1.10 0.85 0.77  CALCIUM 8.1* 8.9 8.2* 8.0*   GFR: Estimated Creatinine Clearance: 90.1 mL/min (by C-G formula based on SCr of  0.77 mg/dL). Liver Function Tests: Recent Labs  Lab 03/12/18 1241 03/16/18 0148  AST 16 13*  ALT 8 7  ALKPHOS 65 50  BILITOT 0.7 0.5  PROT 8.4* 7.3  ALBUMIN 1.8* 1.5*   No results for input(s): LIPASE, AMYLASE in the last 168 hours. No results for input(s): AMMONIA in the last 168 hours. Coagulation Profile: Recent Labs  Lab 03/14/18 1339  INR 1.31   Cardiac Enzymes: No results for input(s): CKTOTAL, CKMB, CKMBINDEX, TROPONINI in the last 168 hours. BNP (last 3 results) No results for input(s): PROBNP in the last 8760 hours. HbA1C: No results for input(s): HGBA1C in the last 72 hours. CBG: No results for input(s): GLUCAP in the last 168 hours. Lipid Profile: No results for input(s): CHOL, HDL, LDLCALC, TRIG, CHOLHDL, LDLDIRECT in the last 72 hours. Thyroid Function Tests: No results for input(s): TSH, T4TOTAL, FREET4, T3FREE, THYROIDAB in the last 72 hours. Anemia Panel: No results for input(s): VITAMINB12, FOLATE, FERRITIN, TIBC, IRON, RETICCTPCT in the last 72 hours. Sepsis Labs: No results for input(s): PROCALCITON, LATICACIDVEN in the last 168 hours.  Recent Results (from the past 240 hour(s))  Culture, blood (routine x 2) Call MD if unable to obtain prior to antibiotics being given     Status: None (Preliminary result)   Collection Time: 03/14/18  3:44 PM  Result Value Ref Range Status   Specimen Description BLOOD RIGHT ANTECUBITAL  Final   Special Requests  Final    BOTTLES DRAWN AEROBIC AND ANAEROBIC Blood Culture adequate volume   Culture   Final    NO GROWTH 3 DAYS Performed at [facility], [place] [address], [place], [place] [address]    Report Status PENDING  Incomplete  Culture, blood (routine x 2) Call MD if unable to obtain prior to antibiotics being given     Status: None (Preliminary result)   Collection Time: 03/14/18  3:55 PM  Result Value Ref Range Status   Specimen Description BLOOD LEFT ANTECUBITAL  Final   Special Requests   Final     BOTTLES DRAWN AEROBIC AND ANAEROBIC Blood Culture adequate volume   Culture   Final    NO GROWTH 3 DAYS Performed at [facility], [address], [place], [place] [address]    Report Status PENDING  Incomplete  Acid Fast Smear (AFB)     Status: None   Collection Time: 03/14/18  4:18 PM  Result Value Ref Range Status   AFB Specimen Processing Concentration  Final   Acid Fast Smear Negative  Final    Comment: (NOTE) Performed At: [facility] [place], [place] [id number] Rush Farmer MD UU:[id number]    Source (AFB) PLEURAL  Final    Comment: Performed at [facility], [place] [address], [place], [place] [address]  Fungus Culture With Stain     Status: None (Preliminary result)   Collection Time: 03/14/18  4:18 PM  Result Value Ref Range Status   Fungus Stain Final report  Final    Comment: (NOTE) Performed At: [facility] [place], [place] [id number] Rush Farmer MD PP:[id number]    Fungus (Mycology) Culture PENDING  Incomplete   Fungal Source PLEURAL  Final    Comment: Performed at [facility], [place] [address] [place], [place] [address]  Fungus Culture Result     Status: None   Collection Time: 03/14/18  4:18 PM  Result Value Ref Range Status   Result 1 Comment  Final    Comment: (NOTE) KOH/Calcofluor preparation:  no fungus observed. Performed At: [facility] [place], [place] [id number] Rush Farmer MD TD:[id number]   Aerobic/Anaerobic Culture (surgical/deep wound)     Status: None (Preliminary result)   Collection Time: 03/14/18  5:41 PM  Result Value Ref Range Status   Specimen Description ABSCESS  Final   Special Requests Normal  Final   Gram Stain   Final    ABUNDANT WBC PRESENT, PREDOMINANTLY PMN ABUNDANT GRAM NEGATIVE RODS    Culture   Final    NO GROWTH 2 DAYS NO ANAEROBES ISOLATED; CULTURE IN PROGRESS FOR 5 DAYS Performed at [facility], [address]  [place], [place] [address]    Report Status PENDING  Incomplete  Culture, sputum-assessment     Status: None   Collection Time: 03/14/18 10:44 PM  Result Value Ref Range Status   Specimen Description EXPECTORATED SPUTUM  Final   Special Requests Normal  Final   Sputum evaluation   Final    THIS SPECIMEN IS ACCEPTABLE FOR SPUTUM CULTURE Performed at [facility], [address], [place], [place] [address]    Report Status 03/16/2018 FINAL  Final  Culture, respiratory     Status: None (Preliminary result)   Collection Time: 03/14/18 10:44 PM  Result Value Ref Range Status   Specimen Description EXPECTORATED SPUTUM  Final   Special Requests Normal Reflexed  from [id number]  Final   Gram Stain   Final    ABUNDANT WBC PRESENT,BOTH PMN AND MONONUCLEAR FEW GRAM POSITIVE COCCI FEW GRAM VARIABLE ROD FEW SQUAMOUS EPITHELIAL CELLS PRESENT    Culture   Final    CULTURE REINCUBATED FOR BETTER GROWTH Performed at [facility], [place] [address], [place], [place] [address]    Report Status PENDING  Incomplete  Expectorated sputum assessment w rflx to resp cult     Status: None   Collection Time: 03/17/18 12:58 AM  Result Value Ref Range Status   Specimen Description EXPECTORATED SPUTUM  Final   Special Requests NONE  Final   Sputum evaluation   Final    THIS SPECIMEN IS ACCEPTABLE FOR SPUTUM CULTURE Performed at [facility], [place] [address], [place], [place] [address]    Report Status 03/17/2018 FINAL  Final  Culture, respiratory     Status: None (Preliminary result)   Collection Time: 03/17/18 12:58 AM  Result Value Ref Range Status   Specimen Description EXPECTORATED SPUTUM  Final   Special Requests NONE Reflexed from [id number]  Final   Gram Stain   Final    ABUNDANT WBC PRESENT, PREDOMINANTLY PMN RARE GRAM VARIABLE ROD Performed at [facility], [place] [address], [place], [place] [address]    Culture PENDING  Incomplete   Report Status PENDING  Incomplete          Radiology Studies: Dg Chest Port 1 View  Result Date: 03/17/2018 CLINICAL DATA:  Empyema of right lung. EXAM: PORTABLE CHEST 1 VIEW COMPARISON:  Radiograph of March 15, 2018. FINDINGS: Stable cardiomediastinal silhouette. No pneumothorax is noted. Stable position of right basilar pleural drainage catheter. Continued presence of right basilar pneumothorax is noted most likely due to incomplete re-expansion of right lung due to cortical scarring and long-standing pleural effusion. Stable diffuse right lung densities are noted consistent with chronic scarring or infection. Bony thorax is unremarkable. IMPRESSION: Stable position of right basilar pleural drainage catheter. Slightly decreased right basilar pneumothorax is noted. Stable right lung findings as described above. Electronically Signed   By: Marijo Conception, M.D.   On: 03/17/2018 10:20        Scheduled Meds: . alteplase (TPA) for intrapleural administration  10 mg Intrapleural Q12H   And  . pulmozyme (DORNASE) for intrapleural administration  5 mg Intrapleural Q12H  . folic acid  1 mg Oral Daily  . lidocaine  1 patch Transdermal Q24H  . multivitamin with minerals  1 tablet Oral Daily  . nystatin  5 mL Oral QID  . thiamine  100 mg Oral Daily   Or  . thiamine  100 mg Intravenous Daily   Continuous Infusions: . piperacillin-tazobactam (ZOSYN)  IV 3.375 g (03/17/18 0556)     LOS: 3 days   Time spent= 30 mins    Jaquaya Coyle Arsenio Loader, MD [facility] Pager [phone]   If 7PM-7AM, please contact night-coverage [email or web address] Password [email or web address] 03/17/2018, 11:04 AM

## 2018-03-17 NOTE — Progress Notes (Signed)
Call from pharmacy stating that Alteplase medication was ready for pick up to give to patient scheduled for 10pm. Paged Cardiothoracic surgery to see if patient needed a second thrombolytic infusion from earlier today. Secretary left message with on-call MD. Waiting for a return call. Will continue to monitor.

## 2018-03-17 NOTE — Progress Notes (Signed)
      [address]       [place] [address]             [phone]      Vitals:   03/16/18 2231 03/17/18 0542  BP: 114/68 115/68  Pulse: (!) 52 64  Resp: 18 (!) 22  Temp: 97.8 F (36.6 C) 97.6 F (36.4 C)  SpO2: 98% 97%    I/O last 3 completed shifts: In: 412.3 [IV Piggyback:412.3] Out: 2490 [Urine:2070; Chest Tube:420] Total I/O In: 120 [P.O.:120] Out: 475 [Urine:475]  Dg Chest Port 1 View  Result Date: 03/17/2018 CLINICAL DATA:  Empyema of right lung. EXAM: PORTABLE CHEST 1 VIEW COMPARISON:  Radiograph of March 15, 2018. FINDINGS: Stable cardiomediastinal silhouette. No pneumothorax is noted. Stable position of right basilar pleural drainage catheter. Continued presence of right basilar pneumothorax is noted most likely due to incomplete re-expansion of right lung due to cortical scarring and long-standing pleural effusion. Stable diffuse right lung densities are noted consistent with chronic scarring or infection. Bony thorax is unremarkable. IMPRESSION: Stable position of right basilar pleural drainage catheter. Slightly decreased right basilar pneumothorax is noted. Stable right lung findings as described above. Electronically Signed   By: Marijo Conception, M.D.   On: 03/17/2018 10:20    Brief Procedure: Alteplase/Dornase thrombolytic infused in the right chest tube per usual technique.  Patient tolerated procedure well   John Giovanni, PA-C  Pager [phone]

## 2018-03-17 NOTE — Progress Notes (Signed)
AFB culture ordered per MD request. Patient culture sent to lab with requisitions.

## 2018-03-18 ENCOUNTER — Inpatient Hospital Stay (HOSPITAL_COMMUNITY): Payer: No Typology Code available for payment source

## 2018-03-18 LAB — CBC
HCT: 39.7 % (ref 39.0–52.0)
HEMOGLOBIN: 11.9 g/dL — AB (ref 13.0–17.0)
MCH: 26.5 pg (ref 26.0–34.0)
MCHC: 30 g/dL (ref 30.0–36.0)
MCV: 88.4 fL (ref 80.0–100.0)
Platelets: 471 10*3/uL — ABNORMAL HIGH (ref 150–400)
RBC: 4.49 MIL/uL (ref 4.22–5.81)
RDW: 13.9 % (ref 11.5–15.5)
WBC: 6.1 10*3/uL (ref 4.0–10.5)
nRBC: 0 % (ref 0.0–0.2)

## 2018-03-18 LAB — ACID FAST SMEAR (AFB, MYCOBACTERIA)

## 2018-03-18 LAB — CULTURE, RESPIRATORY W GRAM STAIN
Culture: NORMAL
Special Requests: NORMAL

## 2018-03-18 LAB — EXPECTORATED SPUTUM ASSESSMENT W GRAM STAIN, RFLX TO RESP C

## 2018-03-18 LAB — ACID FAST SMEAR (AFB): ACID FAST SMEAR - AFSCU2: NEGATIVE

## 2018-03-18 LAB — BASIC METABOLIC PANEL
Anion gap: 8 (ref 5–15)
CHLORIDE: 98 mmol/L (ref 98–111)
CO2: 28 mmol/L (ref 22–32)
Calcium: 8.4 mg/dL — ABNORMAL LOW (ref 8.9–10.3)
Creatinine, Ser: 0.68 mg/dL (ref 0.61–1.24)
GLUCOSE: 103 mg/dL — AB (ref 70–99)
Potassium: 3.2 mmol/L — ABNORMAL LOW (ref 3.5–5.1)
Sodium: 134 mmol/L — ABNORMAL LOW (ref 135–145)

## 2018-03-18 LAB — EXPECTORATED SPUTUM ASSESSMENT W REFEX TO RESP CULTURE

## 2018-03-18 LAB — ASPERGILLUS ANTIBODY BY IMMUNODIFF
Aspergillus flavus: NEGATIVE
Aspergillus fumigatus, IgG: NEGATIVE
Aspergillus niger: NEGATIVE

## 2018-03-18 LAB — TSH: TSH: 2.459 u[IU]/mL (ref 0.350–4.500)

## 2018-03-18 LAB — MAGNESIUM: Magnesium: 1.8 mg/dL (ref 1.7–2.4)

## 2018-03-18 LAB — VITAMIN B12: VITAMIN B 12: 703 pg/mL (ref 180–914)

## 2018-03-18 LAB — CULTURE, RESPIRATORY

## 2018-03-18 MED ORDER — NICOTINE 21 MG/24HR TD PT24
21.0000 mg | MEDICATED_PATCH | Freq: Every day | TRANSDERMAL | Status: DC
  Administered 2018-03-18: 21 mg via TRANSDERMAL
  Filled 2018-03-18 (×4): qty 1

## 2018-03-18 MED ORDER — HYDROCORTISONE 2.5 % RE CREA
TOPICAL_CREAM | Freq: Two times a day (BID) | RECTAL | Status: DC
  Administered 2018-03-18: 18:00:00 via RECTAL
  Filled 2018-03-18: qty 28.35

## 2018-03-18 MED ORDER — LORAZEPAM 1 MG PO TABS
2.0000 mg | ORAL_TABLET | Freq: Four times a day (QID) | ORAL | Status: DC | PRN
  Administered 2018-03-18 (×2): 2 mg via ORAL
  Filled 2018-03-18 (×3): qty 2

## 2018-03-18 NOTE — Progress Notes (Signed)
[facility]  Date of Admission:  03/14/2018     Total days of antibiotics 5         ASSESSMENT/PLAN  Mr. Rebert is on Day 5 of antimicrobial therapy for empyema and has remained afebrile with no leukocytosis. Continuing to await further AFB smears to rule out TB, although this appears to be other bacterial infection. Unfortunately cultures remain pending and he remains on broad spectrum coverage with piperacillin-tazobactam at this time. Chest tube drainage has improved with addition of thrombolytics. CVTS with plans to discuss surgery versus tube removal if unable to eliminate this area.   1. Continue piperacillin-tazobactam pending culture results with narrowing as able. 2. Airborne precautions until 3 negative AFB smears are achieved and TB can be ruled out. 3. Monitor fever curve and WBC count.   Principal Problem:   Empyema ([place]) Active Problems:   Thrombocytosis (HCC)   Hyponatremia   Tobacco use   Malnutrition of moderate degree   . alteplase (TPA) for intrapleural administration  10 mg Intrapleural Q12H   And  . pulmozyme (DORNASE) for intrapleural administration  5 mg Intrapleural Q12H  . folic acid  1 mg Oral Daily  . hydrocortisone   Rectal BID  . lidocaine  1 patch Transdermal Q24H  . multivitamin with minerals  1 tablet Oral Daily  . nicotine  21 mg Transdermal Daily  . nystatin  5 mL Oral QID  . thiamine  100 mg Oral Daily   Or  . thiamine  100 mg Intravenous Daily    SUBJECTIVE:  Mr. Holzschuh has been afebrile overnight with no leukocytosis. He was agitated overnight with the need for restraints. He has no new complaints today.   No Known Allergies   Review of Systems: Review of Systems  Constitutional: Negative for chills and fever.  Respiratory: Positive for cough and sputum production. Negative for hemoptysis, shortness of breath and wheezing.   Cardiovascular: Negative for chest pain and leg swelling.    Gastrointestinal: Negative for abdominal pain, constipation, diarrhea, nausea and vomiting.  Skin: Negative for rash.    OBJECTIVE: Vitals:   03/17/18 1606 03/17/18 2108 03/18/18 0354 03/18/18 1541  BP: 123/71 119/81 132/85 (!) 141/73  Pulse: 74 84 79 63  Resp: 19 19  18   Temp: 98.3 F (36.8 C) 97.9 F (36.6 C)  97.9 F (36.6 C)  TempSrc: Oral Oral  Oral  SpO2: 100% 97% 96% 99%  Weight:      Height:       Body mass index is 20.35 kg/m.  Physical Exam  Constitutional: He is oriented to person, place, and time. He appears well-developed. No distress.  Cardiovascular: Normal rate, regular rhythm, normal heart sounds and intact distal pulses. Exam reveals no gallop and no friction rub.  No murmur heard. Pulmonary/Chest: Effort normal and breath sounds normal. No respiratory distress. He has no wheezes. He has no rales. He exhibits no tenderness.  Chest tube remains to suction and is patent. Fluid appears to be clearing.   Neurological: He is alert and oriented to person, place, and time.  Skin: Skin is warm and dry.  Psychiatric: His speech is normal.  Flat affect.  He is inattentive.    Lab Results Lab Results  Component Value Date   WBC 6.1 03/18/2018   HGB 11.9 (L) 03/18/2018   HCT 39.7 03/18/2018   MCV 88.4 03/18/2018   PLT 471 (H) 03/18/2018    Lab Results  Component Value Date  CREATININE 0.68 03/18/2018   BUN <5 (L) 03/18/2018   NA 134 (L) 03/18/2018   K 3.2 (L) 03/18/2018   CL 98 03/18/2018   CO2 28 03/18/2018    Lab Results  Component Value Date   ALT 7 03/16/2018   AST 13 (L) 03/16/2018   ALKPHOS 50 03/16/2018   BILITOT 0.5 03/16/2018     Microbiology: Recent Results (from the past 240 hour(s))  Culture, blood (routine x 2) Call MD if unable to obtain prior to antibiotics being given     Status: None (Preliminary result)   Collection Time: 03/14/18  3:44 PM  Result Value Ref Range Status   Specimen Description BLOOD RIGHT ANTECUBITAL  Final    Special Requests   Final    BOTTLES DRAWN AEROBIC AND ANAEROBIC Blood Culture adequate volume   Culture   Final    NO GROWTH 4 DAYS Performed at [facility], [place] [address], [place], [place] [address]    Report Status PENDING  Incomplete  Culture, blood (routine x 2) Call MD if unable to obtain prior to antibiotics being given     Status: None (Preliminary result)   Collection Time: 03/14/18  3:55 PM  Result Value Ref Range Status   Specimen Description BLOOD LEFT ANTECUBITAL  Final   Special Requests   Final    BOTTLES DRAWN AEROBIC AND ANAEROBIC Blood Culture adequate volume   Culture   Final    NO GROWTH 4 DAYS Performed at [facility], [place] [address] [place], [place] [address]    Report Status PENDING  Incomplete  Acid Fast Smear (AFB)     Status: None   Collection Time: 03/14/18  4:18 PM  Result Value Ref Range Status   AFB Specimen Processing Concentration  Final   Acid Fast Smear Negative  Final    Comment: (NOTE) Performed At: [facility] [place], [place] [id number] Rush Farmer MD [id number]    Source (AFB) PLEURAL  Final    Comment: Performed at [facility], [place] [address], [place], [place] [address]  Fungus Culture With Stain     Status: None (Preliminary result)   Collection Time: 03/14/18  4:18 PM  Result Value Ref Range Status   Fungus Stain Final report  Final    Comment: (NOTE) Performed At: [facility] [place], [place] [id number] Rush Farmer MD [id number]    Fungus (Mycology) Culture PENDING  Incomplete   Fungal Source PLEURAL  Final    Comment: Performed at [facility], [place] [address], [place], [place] [address]  Fungus Culture Result     Status: None   Collection Time: 03/14/18  4:18 PM  Result Value Ref Range Status   Result 1 Comment  Final    Comment: (NOTE) KOH/Calcofluor preparation:  no fungus observed. Performed At: [facility] [place], [place] [id number] Rush Farmer MD YP:[id number]   Aerobic/Anaerobic Culture (surgical/deep wound)     Status: None (Preliminary result)   Collection Time: 03/14/18  5:41 PM  Result Value Ref Range Status   Specimen Description ABSCESS  Final   Special Requests Normal  Final   Gram Stain   Final    ABUNDANT WBC PRESENT, PREDOMINANTLY PMN ABUNDANT GRAM NEGATIVE RODS    Culture   Final    CULTURE REINCUBATED FOR BETTER GROWTH Performed at [facility], [address], [place], [place] [address]  Report Status PENDING  Incomplete  Culture, sputum-assessment     Status: None   Collection Time: 03/14/18 10:44 PM  Result Value Ref Range Status   Specimen Description EXPECTORATED SPUTUM  Final   Special Requests Normal  Final   Sputum evaluation   Final    THIS SPECIMEN IS ACCEPTABLE FOR SPUTUM CULTURE Performed at [facility], [address], [place], [place] [address]    Report Status 03/16/2018 FINAL  Final  Culture, respiratory     Status: None   Collection Time: 03/14/18 10:44 PM  Result Value Ref Range Status   Specimen Description EXPECTORATED SPUTUM  Final   Special Requests Normal Reflexed from [id number]  Final   Gram Stain   Final    ABUNDANT WBC PRESENT,BOTH PMN AND MONONUCLEAR FEW GRAM POSITIVE COCCI FEW GRAM VARIABLE ROD FEW SQUAMOUS EPITHELIAL CELLS PRESENT    Culture   Final    Consistent with normal respiratory flora. Performed at [facility], [place] [address], [place], [place] [address]    Report Status 03/18/2018 FINAL  Final  Acid Fast Smear (AFB)     Status: None   Collection Time: 03/17/18 12:57 AM  Result Value Ref Range Status   AFB Specimen Processing Concentration  Final   Acid Fast Smear Negative  Final    Comment: (NOTE) Performed At: [facility] [place], [place] [id number] Rush Farmer MD [id number]    Source (AFB) EXPECTORATED SPUTUM  Final    Comment: Performed at [facility], [place] [address] [place], [place] [address]  Expectorated sputum assessment w rflx to resp cult     Status: None   Collection Time: 03/17/18 12:58 AM  Result Value Ref Range Status   Specimen Description EXPECTORATED SPUTUM  Final   Special Requests NONE  Final   Sputum evaluation   Final    THIS SPECIMEN IS ACCEPTABLE FOR SPUTUM CULTURE Performed at [facility], [place] [address], [place], [place] [address]    Report Status 03/17/2018 FINAL  Final  Culture, respiratory     Status: None (Preliminary result)   Collection Time: 03/17/18 12:58 AM  Result Value Ref Range Status   Specimen Description EXPECTORATED SPUTUM  Final   Special Requests NONE Reflexed from [id number]  Final   Gram Stain   Final    ABUNDANT WBC PRESENT, PREDOMINANTLY PMN RARE GRAM VARIABLE ROD    Culture   Final    CULTURE REINCUBATED FOR BETTER GROWTH Performed at [facility], [place] [address], [place], [place] [address]    Report Status PENDING  Incomplete  Expectorated sputum assessment w rflx to resp cult     Status: None   Collection Time: 03/17/18 10:30 AM  Result Value Ref Range Status   Specimen Description SPUTUM  Final   Special Requests NONE  Final   Sputum evaluation   Final    Sputum specimen not acceptable for testing.  Please recollect.   RESULT CALLED TO, READ BACK BY AND VERIFIED WITH: PERRY RN AT 1143 ON [id number] BY SJW Performed at [facility], [place] [address] [place], [place] [address]    Report Status 03/18/2018 FINAL  Final     Terri Piedra, NP [place] [facility] [phone] Pager  03/18/2018  4:22 PM

## 2018-03-18 NOTE — Progress Notes (Signed)
Referring Physician(s): Dr. Reesa Chew  Supervising Physician: Sandi Mariscal  Patient Status:  [facility] - In-pt  Chief Complaint: Follow-up right chest tube placement 10/21 with Dr. Anselm Pancoast due to empyema  Subjective:  Patient states he feels fine, denies dyspnea, cough or chest pain. States appetite is "alright." Required restraints overnight due to agitation.   Allergies: Patient has no known allergies.  Medications: Prior to Admission medications   Medication Sig Start Date End Date Taking? Authorizing Provider  hydrocortisone cream 1 % Place 1 application rectally as needed for itching.  02/21/18  Yes [provider]  naproxen sodium (ALEVE) 220 MG tablet Take 960 mg by mouth daily as needed (pain).    Yes [provider]     Vital Signs: BP 132/85 (BP Location: Left Arm)   Pulse 79   Temp 97.9 F (36.6 C) (Oral)   Resp 19   Ht 5\' 9"  (1.753 m)   Wt 137 lb 12.6 oz (62.5 kg)   SpO2 96%   BMI 20.35 kg/m   Physical Exam  Constitutional: No distress.  Cardiovascular: Normal rate, regular rhythm and normal heart sounds.  Pulmonary/Chest: Effort normal and breath sounds normal.  Abdominal: Soft. He exhibits no distension. There is no tenderness.  ~1,000 cc serosanguineous output in PleurVac  Neurological: He is alert.  Skin: Skin is warm and dry. He is not diaphoretic.  Nursing note and vitals reviewed.   Imaging: Dg Chest Port 1 View  Result Date: 03/18/2018 CLINICAL DATA:  Empyema of right lung. EXAM: PORTABLE CHEST 1 VIEW COMPARISON:  Radiograph of March 17, 2018. FINDINGS: The heart size and mediastinal contours are within normal limits. Left lung is clear. Stable diffuse opacity seen in right lung consistent with chronic inflammation or scarring. Stable position of right basilar chest tube. Stable mild right basilar pneumothorax is noted. The visualized skeletal structures are unremarkable. IMPRESSION: Stable position of right basilar chest tube with mild  right basilar pneumothorax. Stable diffuse right lung opacity as described above. Electronically Signed   By: Marijo Conception, M.D.   On: 03/18/2018 10:21   Dg Chest Port 1 View  Result Date: 03/17/2018 CLINICAL DATA:  Empyema of right lung. EXAM: PORTABLE CHEST 1 VIEW COMPARISON:  Radiograph of March 15, 2018. FINDINGS: Stable cardiomediastinal silhouette. No pneumothorax is noted. Stable position of right basilar pleural drainage catheter. Continued presence of right basilar pneumothorax is noted most likely due to incomplete re-expansion of right lung due to cortical scarring and long-standing pleural effusion. Stable diffuse right lung densities are noted consistent with chronic scarring or infection. Bony thorax is unremarkable. IMPRESSION: Stable position of right basilar pleural drainage catheter. Slightly decreased right basilar pneumothorax is noted. Stable right lung findings as described above. Electronically Signed   By: Marijo Conception, M.D.   On: 03/17/2018 10:20   Dg Chest Port 1 View  Result Date: 03/15/2018 CLINICAL DATA:  Right chest tube. EXAM: PORTABLE CHEST 1 VIEW COMPARISON:  CT 03/14/2018, 03/03/2018.  Chest x-ray 12/21/2017. FINDINGS: Right chest tube noted over the right lung base in stable position. Known right hydropneumothorax appears to have decreased in size. No evidence of prominent recurrent effusion. Small right pleural effusion may be present. Severe right pleuroparenchymal thickening noted most consistent with scarring. Chronic pulmonary changes including bronchiectasis are again noted unchanged. Previously identified left pulmonary nodule best identified on prior recent CT, reference is made to prior recent CT of 03/03/2018. Heart size normal. No acute bony abnormality or changes thoracic  spine IMPRESSION: 1. Right chest tube noted over the right lung base in stable position. Known right hydropneumothorax appears to be decreased in size. No evidence of prominent  recurrent effusion. Small right pleural effusion may be present. 2. Severe right pleural-parenchymal thickening again noted consistent scarring. Chronic pulmonary changes including bronchiectasis are again noted in the right lung. No interim change. Previously identified left pulmonary nodule best identified on prior recent CT, reference is made to prior chest CT of 03/03/2018. Electronically Signed   By: Marcello Moores  Register   On: 03/15/2018 07:52   Ct Image Guided Drainage By Percutaneous Catheter  Result Date: 03/14/2018 INDICATION: 57 year old with hemoptysis and probable right lung empyema. Patient presents for chest tube placement. EXAM: CT-GUIDED PLACEMENT OF RIGHT CHEST TUBE MEDICATIONS: Moderate sedation medication ANESTHESIA/SEDATION: Fentanyl 50 mcg IV; Versed 2 mg IV Moderate Sedation Time:  39 minutes The patient was continuously monitored during the procedure by the interventional radiology nurse under my direct supervision. COMPLICATIONS: None immediate. PROCEDURE: Informed written consent was obtained from the patient after a thorough discussion of the procedural risks, benefits and alternatives. All questions were addressed. Maximal Sterile Barrier Technique was utilized including caps, mask, sterile gowns, sterile gloves, sterile drape, hand hygiene and skin antiseptic. Procedure was also performed with respiratory droplet precautions. A timeout was performed prior to the initiation of the procedure. Patient was placed on his left side and CT images through the chest were obtained. The right mid axillary region was prepped and draped in sterile fashion. Skin and soft tissues were anesthetized with 1% lidocaine. Yueh catheter was directed into the pleural space and purulent thick yellow fluid was aspirated. A stiff Amplatz wire was placed. The tract was dilated to accommodate a 14 [place] multipurpose drain. Total of 850 mL of yellow purulent fluid was removed. Catheter was sutured to skin and  attached to a PleurEvac. FINDINGS: Large fluid collection in the right lower chest. 850 mL of purulent fluid was removed from this cavity. Follow up CT images demonstrate a large hydropneumothorax following evacuation of most of the pleural fluid. Initial CT images again demonstrated bronchiectasis and severe parenchymal disease in the right upper lung. Complex hydropneumothorax along the right upper lobe. Again noted is a mildly spiculated nodule in the left lung which was present on the previous diagnostic examination. IMPRESSION: CT-guided placement of a chest tube in the right lung empyema. 850 mL of yellow purulent fluid was removed. Fluid was sent for analysis. Electronically Signed   By: Markus Daft M.D.   On: 03/14/2018 18:01    Labs:  CBC: Recent Labs    03/14/18 1033 03/15/18 0500 03/16/18 0148 03/18/18 0551  WBC 13.4* 9.5 8.1 6.1  HGB 14.9 12.4* 12.1* 11.9*  HCT 49.7 39.4 39.7 39.7  PLT 633* 460* 497* 471*    COAGS: Recent Labs    03/04/18 1015 03/14/18 1339  INR 1.28 1.31    BMP: Recent Labs    03/14/18 1033 03/15/18 0500 03/16/18 0148 03/18/18 0551  NA 131* 130* 130* 134*  K 4.7 3.8 3.4* 3.2*  CL 91* 96* 96* 98  CO2 29 28 27 28   GLUCOSE 167* 97 108* 103*  BUN 12 11 8  <5*  CALCIUM 8.9 8.2* 8.0* 8.4*  CREATININE 1.10 0.85 0.77 0.68  GFRNONAA >60 >60 >60 >60  GFRAA >60 >60 >60 >60    LIVER FUNCTION TESTS: Recent Labs    03/12/18 1241 03/16/18 0148  BILITOT 0.7 0.5  AST 16 13*  ALT 8 7  ALKPHOS 65 50  PROT 8.4* 7.3  ALBUMIN 1.8* 1.5*    Assessment and Plan:  S/p right chest tube placement 10/21 with Dr. Anselm Pancoast due to empyema. ~1000 cc serosanguineous output on exam today; last recorded 50 cc in I/O.   Anteplase/doranse infusion per cardiothoracic surgery x 3, suction increased to -40 cm today to be decreased to -20 cm tomorrow per surgery note - possibly for VATS in the future if not improved with infusion.   Patient symptomatically improved -  afebrile, WBC 6.1 yesterday, he continues on Zosyn.   IR will continue to follow, further plans per surgery.  Electronically Signed: Joaquim Nam, PA-C 03/18/2018, 3:38 PM   I spent a total of 15 Minutes at the the patient's bedside AND on the patient's hospital floor or unit, greater than 50% of which was counseling/coordinating care for right chest tube.

## 2018-03-18 NOTE — Progress Notes (Signed)
Pt very agitated tonight. Pulled out the 2nd IV that have just been placed, also tried to pull out chest tube. Said " I want to go to the convenience store and get myself a beer, there is none in here, you are not doing anything for me why don't you let me go home". Pt very hard to reorient. Jeannette Corpus, NP notified, ordered Prn ativan and a Air cabin crew. Will pull 1 of the Tech. To sit with the Pt. For safety.

## 2018-03-18 NOTE — Progress Notes (Signed)
PROGRESS NOTE    Derrick Beltran  UXL:[id number] DOB: 25-Mar-1961 DOA: 03/14/2018 PCP: [name], PA-C   Brief Narrative:  57 year old with past medical history of tobacco use, medical noncompliance came to the hospital about 2 weeks ago with complains of progressive weakness fatigue and hemoptysis.  At that time CT of the chest showed abnormal right upper lobe with right-sided pleural effusion.  There was plans for thoracentesis but patient ended up leaving [facility].  He returned to the ER again on 10/21 with similar complaints and agreed to stay for the admission.  IR was consulted and CT-guided right-sided [place] drain was placed on 10/21.  Cardiothoracic surgery, pulmonology and infectious disease were consulted on 10/22.  During the stay patient continues to have intermittent agitation requiring Ativan.   Assessment & Plan:   Principal Problem:   Empyema ([place]) Active Problems:   Thrombocytosis (HCC)   Hyponatremia   Tobacco use   Malnutrition of moderate degree  Right-sided pleural empyema with hydropneumothorax Right upper lobe bronchiectasis - Chest tube is in place since 03/14/2018 draining purulent/serosanguineous fluid.  TPA performed by cardiothoracic surgery.  Hoping to get some results this way and to avoid VATS procedure as patient is also reluctant to do this. -Chest x-ray from 10/24- stable position of pleural catheter, slightly decreased right basilar pneumothorax, stable right lung findings.  Repeat chest x-ray 10/25 shows stable right lung diffuse opacity. - Gram stain shows gram-negative rods but rest of the cultures and cytology are pending -HiV is negative - Acid-fast culture- second set pending -Fungitell- negative -Aspergillus antibody- pending -Quantiferron TB - intermediate; repeat testing -pending -Infectious disease consulted-appreciate input, repeat sputum cultures ordered -Currently on Zosyn. - No plans for bronchoscopy at this point,  malignancy eval can be performed outpatient on nonemergent basis once this acute illnesses over. - Urine strep is negative -Cardiothoracic surgery following-TPA being administered.  Agitation and delirium - Suspect possibly alcohol withdrawal related.  Patient is getting Ativan, will add 2 mg of oral Ativan every 6 hours as needed.  Tobacco use - Counseled to quit using tobacco.  Alcohol use -Alcohol withdrawal protocol.  Multivitamins  Severe protein calorie malnutrition -Encourage oral intake  DVT prophylaxis: SCDs Code Status: Full code Family Communication: None at bedside Disposition Plan: Maintain inpatient stay at this time to ensure his empyema has improved and remaining test results are available before making final disposition decision.  He is at a high risk of leaving [facility].  Consultants:  Interventional radiology Infectious disease Cardiothoracic surgery Pulmonary critical care medicine  Procedures:   CT-guided French catheter placement in the chest 10/21  Antimicrobials:   IV Zosyn   Subjective: Patient was quite agitated yesterday evening and early in the morning.  He off-and-on remains very uncooperative. Pulled out his IV line yesterday  Review of Systems Otherwise negative except as per HPI, including: General = no fevers, chills, dizziness, malaise, fatigue HEENT/EYES = negative for pain, redness, loss of vision, double vision, blurred vision, loss of hearing, sore throat, hoarseness, dysphagia Cardiovascular= negative for chest pain, palpitation, murmurs, lower extremity swelling Respiratory/lungs= negative for shortness of breath, cough, hemoptysis, wheezing, mucus production Gastrointestinal= negative for nausea, vomiting,, abdominal pain, melena, hematemesis Genitourinary= negative for Dysuria, Hematuria, Change in Urinary Frequency MSK = Negative for arthralgia, myalgias, Back Pain, Joint swelling  Neurology= Negative for  headache, seizures, numbness, tingling  Psychiatry= Negative for anxiety, depression, suicidal and homocidal ideation Allergy/Immunology= Medication/Food allergy as listed  Skin= Negative for Rash,  lesions, ulcers, itching    Objective: Vitals:   03/17/18 0542 03/17/18 1606 03/17/18 2108 03/18/18 0354  BP: 115/68 123/71 119/81 132/85  Pulse: 64 74 84 79  Resp: (!) 22 19 19    Temp: 97.6 F (36.4 C) 98.3 F (36.8 C) 97.9 F (36.6 C)   TempSrc: Oral Oral Oral   SpO2: 97% 100% 97% 96%  Weight:      Height:        Intake/Output Summary (Last 24 hours) at 03/18/2018 1120 Last data filed at 03/18/2018 1014 Gross per 24 hour  Intake 589 ml  Output 775 ml  Net -186 ml   Filed Weights   03/16/18 1603  Weight: 62.5 kg    Examination:  Constitutional: NAD, calm, comfortable Eyes: PERRL, lids and conjunctivae normal ENMT: Mucous membranes are moist. Posterior pharynx clear of any exudate or lesions.Normal dentition.  Neck: normal, supple, no masses, no thyromegaly Respiratory: clear to auscultation bilaterally, no wheezing, no crackles. Normal respiratory effort. No accessory muscle use.  Cardiovascular: Regular rate and rhythm, no murmurs / rubs / gallops. No extremity edema. 2+ pedal pulses. No carotid bruits.  Abdomen: no tenderness, no masses palpated. No hepatosplenomegaly. Bowel sounds positive.  Musculoskeletal: no clubbing / cyanosis. No joint deformity upper and lower extremities. Good ROM, no contractures. Normal muscle tone.  Skin: no rashes, lesions, ulcers. No induration Neurologic: CN 2-12 grossly intact. Sensation intact, DTR normal. Strength 5/5 in all 4.  Psychiatric: Normal judgment and insight. Alert and oriented x 3. Normal mood.  Chest tube site noted   Data Reviewed:   CBC: Recent Labs  Lab 03/12/18 1241 03/14/18 1033 03/15/18 0500 03/16/18 0148 03/18/18 0551  WBC 14.3* 13.4* 9.5 8.1 6.1  NEUTROABS  --   --  7.1  --   --   HGB 13.5 14.9 12.4*  12.1* 11.9*  HCT 44.8 49.7 39.4 39.7 39.7  MCV 88.9 90.4 88.1 88.6 88.4  PLT 681* 633* 460* 497* 850*   Basic Metabolic Panel: Recent Labs  Lab 03/12/18 1241 03/14/18 1033 03/15/18 0500 03/16/18 0148 03/18/18 0551  NA 125* 131* 130* 130* 134*  K 4.0 4.7 3.8 3.4* 3.2*  CL 93* 91* 96* 96* 98  CO2 23 29 28 27 28   GLUCOSE 162* 167* 97 108* 103*  BUN 6 12 11 8  <5*  CREATININE 0.76 1.10 0.85 0.77 0.68  CALCIUM 8.1* 8.9 8.2* 8.0* 8.4*  MG  --   --   --   --  1.8   GFR: Estimated Creatinine Clearance: 90.1 mL/min (by C-G formula based on SCr of 0.68 mg/dL). Liver Function Tests: Recent Labs  Lab 03/12/18 1241 03/16/18 0148  AST 16 13*  ALT 8 7  ALKPHOS 65 50  BILITOT 0.7 0.5  PROT 8.4* 7.3  ALBUMIN 1.8* 1.5*   No results for input(s): LIPASE, AMYLASE in the last 168 hours. No results for input(s): AMMONIA in the last 168 hours. Coagulation Profile: Recent Labs  Lab 03/14/18 1339  INR 1.31   Cardiac Enzymes: No results for input(s): CKTOTAL, CKMB, CKMBINDEX, TROPONINI in the last 168 hours. BNP (last 3 results) No results for input(s): PROBNP in the last 8760 hours. HbA1C: No results for input(s): HGBA1C in the last 72 hours. CBG: No results for input(s): GLUCAP in the last 168 hours. Lipid Profile: No results for input(s): CHOL, HDL, LDLCALC, TRIG, CHOLHDL, LDLDIRECT in the last 72 hours. Thyroid Function Tests: Recent Labs    03/18/18 0825  TSH 2.459  Anemia Panel: Recent Labs    03/18/18 0825  VITAMINB12 703   Sepsis Labs: No results for input(s): PROCALCITON, LATICACIDVEN in the last 168 hours.  Recent Results (from the past 240 hour(s))  Culture, blood (routine x 2) Call MD if unable to obtain prior to antibiotics being given     Status: None (Preliminary result)   Collection Time: 03/14/18  3:44 PM  Result Value Ref Range Status   Specimen Description BLOOD RIGHT ANTECUBITAL  Final   Special Requests   Final    BOTTLES DRAWN AEROBIC AND  ANAEROBIC Blood Culture adequate volume   Culture   Final    NO GROWTH 4 DAYS Performed at [facility], [address], [place], [place] [address]    Report Status PENDING  Incomplete  Culture, blood (routine x 2) Call MD if unable to obtain prior to antibiotics being given     Status: None (Preliminary result)   Collection Time: 03/14/18  3:55 PM  Result Value Ref Range Status   Specimen Description BLOOD LEFT ANTECUBITAL  Final   Special Requests   Final    BOTTLES DRAWN AEROBIC AND ANAEROBIC Blood Culture adequate volume   Culture   Final    NO GROWTH 4 DAYS Performed at [facility], [place] [address], [place], [place] [address]    Report Status PENDING  Incomplete  Acid Fast Smear (AFB)     Status: None   Collection Time: 03/14/18  4:18 PM  Result Value Ref Range Status   AFB Specimen Processing Concentration  Final   Acid Fast Smear Negative  Final    Comment: (NOTE) Performed At: [facility] [place], [place] [id number] Rush Farmer MD [id number]    Source (AFB) PLEURAL  Final    Comment: Performed at [facility], [place] [address], [place], [place] [address]  Fungus Culture With Stain     Status: None (Preliminary result)   Collection Time: 03/14/18  4:18 PM  Result Value Ref Range Status   Fungus Stain Final report  Final    Comment: (NOTE) Performed At: [facility] [place], [place] [id number] Rush Farmer MD [id number]    Fungus (Mycology) Culture PENDING  Incomplete   Fungal Source PLEURAL  Final    Comment: Performed at [facility], [place] [address] [place], [place] [address]  Fungus Culture Result     Status: None   Collection Time: 03/14/18  4:18 PM  Result Value Ref Range Status   Result 1 Comment  Final    Comment: (NOTE) KOH/Calcofluor preparation:  no fungus observed. Performed At: [facility] [place], [place] [id number] Rush Farmer MD  [id number]   Aerobic/Anaerobic Culture (surgical/deep wound)     Status: None (Preliminary result)   Collection Time: 03/14/18  5:41 PM  Result Value Ref Range Status   Specimen Description ABSCESS  Final   Special Requests Normal  Final   Gram Stain   Final    ABUNDANT WBC PRESENT, PREDOMINANTLY PMN ABUNDANT GRAM NEGATIVE RODS    Culture   Final    NO GROWTH 3 DAYS NO ANAEROBES ISOLATED; CULTURE IN PROGRESS FOR 5 DAYS Performed at [facility], [address] [place], [place] [address]    Report Status PENDING  Incomplete  Culture, sputum-assessment     Status: None   Collection Time: 03/14/18 10:44 PM  Result Value Ref Range Status   Specimen  Description EXPECTORATED SPUTUM  Final   Special Requests Normal  Final   Sputum evaluation   Final    THIS SPECIMEN IS ACCEPTABLE FOR SPUTUM CULTURE Performed at [facility], [place] [address], [place], [place] [address]    Report Status 03/16/2018 FINAL  Final  Culture, respiratory     Status: None   Collection Time: 03/14/18 10:44 PM  Result Value Ref Range Status   Specimen Description EXPECTORATED SPUTUM  Final   Special Requests Normal Reflexed from [id number]  Final   Gram Stain   Final    ABUNDANT WBC PRESENT,BOTH PMN AND MONONUCLEAR FEW GRAM POSITIVE COCCI FEW GRAM VARIABLE ROD FEW SQUAMOUS EPITHELIAL CELLS PRESENT    Culture   Final    Consistent with normal respiratory flora. Performed at [facility], [place] [address] [place], [place] [address]    Report Status 03/18/2018 FINAL  Final  Acid Fast Smear (AFB)     Status: None   Collection Time: 03/17/18 12:57 AM  Result Value Ref Range Status   AFB Specimen Processing Concentration  Final   Acid Fast Smear Negative  Final    Comment: (NOTE) Performed At: [facility] [place], [place] [id number] Rush Farmer MD [id number]    Source (AFB) EXPECTORATED SPUTUM  Final    Comment: Performed at [facility], [place]  [address] [place], [place] [address]  Expectorated sputum assessment w rflx to resp cult     Status: None   Collection Time: 03/17/18 12:58 AM  Result Value Ref Range Status   Specimen Description EXPECTORATED SPUTUM  Final   Special Requests NONE  Final   Sputum evaluation   Final    THIS SPECIMEN IS ACCEPTABLE FOR SPUTUM CULTURE Performed at [facility], [place] [address], [place], [place] [address]    Report Status 03/17/2018 FINAL  Final  Culture, respiratory     Status: None (Preliminary result)   Collection Time: 03/17/18 12:58 AM  Result Value Ref Range Status   Specimen Description EXPECTORATED SPUTUM  Final   Special Requests NONE Reflexed from [id number]  Final   Gram Stain   Final    ABUNDANT WBC PRESENT, PREDOMINANTLY PMN RARE GRAM VARIABLE ROD    Culture   Final    CULTURE REINCUBATED FOR BETTER GROWTH Performed at [facility], [place] [address], [place],  [address]    Report Status PENDING  Incomplete         Radiology Studies: Dg Chest Port 1 View  Result Date: 03/18/2018 CLINICAL DATA:  Empyema of right lung. EXAM: PORTABLE CHEST 1 VIEW COMPARISON:  Radiograph of March 17, 2018. FINDINGS: The heart size and mediastinal contours are within normal limits. Left lung is clear. Stable diffuse opacity seen in right lung consistent with chronic inflammation or scarring. Stable position of right basilar chest tube. Stable mild right basilar pneumothorax is noted. The visualized skeletal structures are unremarkable. IMPRESSION: Stable position of right basilar chest tube with mild right basilar pneumothorax. Stable diffuse right lung opacity as described above. Electronically Signed   By: Marijo Conception, M.D.   On: 03/18/2018 10:21   Dg Chest Port 1 View  Result Date: 03/17/2018 CLINICAL DATA:  Empyema of right lung. EXAM: PORTABLE CHEST 1 VIEW COMPARISON:  Radiograph of March 15, 2018. FINDINGS: Stable cardiomediastinal silhouette. No pneumothorax is noted.  Stable position of right basilar pleural drainage catheter. Continued presence of right basilar pneumothorax is noted  most likely due to incomplete re-expansion of right lung due to cortical scarring and long-standing pleural effusion. Stable diffuse right lung densities are noted consistent with chronic scarring or infection. Bony thorax is unremarkable. IMPRESSION: Stable position of right basilar pleural drainage catheter. Slightly decreased right basilar pneumothorax is noted. Stable right lung findings as described above. Electronically Signed   By: Marijo Conception, M.D.   On: 03/17/2018 10:20        Scheduled Meds: . alteplase (TPA) for intrapleural administration  10 mg Intrapleural Q12H   And  . pulmozyme (DORNASE) for intrapleural administration  5 mg Intrapleural Q12H  . folic acid  1 mg Oral Daily  . lidocaine  1 patch Transdermal Q24H  . multivitamin with minerals  1 tablet Oral Daily  . nystatin  5 mL Oral QID  . thiamine  100 mg Oral Daily   Or  . thiamine  100 mg Intravenous Daily   Continuous Infusions: . piperacillin-tazobactam (ZOSYN)  IV 3.375 g (03/18/18 0546)     LOS: 4 days   Time spent= 22 mins    Ankit Arsenio Loader, MD [facility] Pager [phone]   If 7PM-7AM, please contact night-coverage [email or web address] Password [email or web address] 03/18/2018, 11:20 AM

## 2018-03-18 NOTE — Progress Notes (Signed)
Pt impulsively get OOB wants to use the restroom, IV line, chest tube pulling and sitter told him to wait a second as she"s trying to help him with the lines. Per sitter Pt was being aggressive with her. RN and another NT came to help and security was called for assistance too. Ask if he needed to potty and commode was offered. Pt grabbed and squeezed writers hand so tightly and just loosen it when security asked him so.Pt is hard to redirect at this point. XKennon Holter notified and ordered to start waist and bil wrist restraints.No family member at bedside. Will monitor pt while on restraints and offer food/fluids/BR q 2hrs and as needed.

## 2018-03-18 NOTE — Progress Notes (Addendum)
      [address]       [place] [address]             [id number]     Today's Vitals   03/17/18 2108 03/17/18 2301 03/18/18 0354 03/18/18 0730  BP: 119/81  132/85   Pulse: 84  79   Resp: 19     Temp: 97.9 F (36.6 C)     TempSrc: Oral     SpO2: 97%  96%   Weight:      Height:      PainSc:  3   0-No pain   Body mass index is 20.35 kg/m.    Chest tube drainage: 270 cc yesterday   Dg Chest Port 1 View  Result Date: 03/18/2018 CLINICAL DATA:  Empyema of right lung. EXAM: PORTABLE CHEST 1 VIEW COMPARISON:  Radiograph of March 17, 2018. FINDINGS: The heart size and mediastinal contours are within normal limits. Left lung is clear. Stable diffuse opacity seen in right lung consistent with chronic inflammation or scarring. Stable position of right basilar chest tube. Stable mild right basilar pneumothorax is noted. The visualized skeletal structures are unremarkable. IMPRESSION: Stable position of right basilar chest tube with mild right basilar pneumothorax. Stable diffuse right lung opacity as described above. Electronically Signed   By: Marijo Conception, M.D.   On: 03/18/2018 10:21    Brief Procedure:   Alteplase/Dornase thrombolytic infused in the right chest tube per usual technique. Patient tolerated well. Chest tube placed to 40 cm H2O suction  John Giovanni, PA-C  Patient seen and examined, agree with above Will keep on -40 cm if suction overnight and decrease to -20 cm tomorrow If unable to eliminate space will have to discuss surgery v tube removal  Remo Lipps C. Roxan Hockey, MD [facility] [phone]

## 2018-03-19 LAB — BASIC METABOLIC PANEL
Anion gap: 7 (ref 5–15)
CHLORIDE: 97 mmol/L — AB (ref 98–111)
CO2: 30 mmol/L (ref 22–32)
CREATININE: 0.79 mg/dL (ref 0.61–1.24)
Calcium: 8.4 mg/dL — ABNORMAL LOW (ref 8.9–10.3)
GFR calc Af Amer: >60 mL/min (ref >60)
GFR calc non Af Amer: >60 mL/min (ref >60)
Glucose, Bld: 97 mg/dL (ref 70–99)
Potassium: 3.7 mmol/L (ref 3.5–5.1)
Sodium: 134 mmol/L — ABNORMAL LOW (ref 135–145)

## 2018-03-19 LAB — CBC
HEMATOCRIT: 44.3 % (ref 39.0–52.0)
Hemoglobin: 13.3 g/dL (ref 13.0–17.0)
MCH: 26.7 pg (ref 26.0–34.0)
MCHC: 30 g/dL (ref 30.0–36.0)
MCV: 89 fL (ref 80.0–100.0)
Platelets: 469 10*3/uL — ABNORMAL HIGH (ref 150–400)
RBC: 4.98 MIL/uL (ref 4.22–5.81)
RDW: 14 % (ref 11.5–15.5)
WBC: 7.7 10*3/uL (ref 4.0–10.5)
nRBC: 0 % (ref 0.0–0.2)

## 2018-03-19 LAB — CULTURE, RESPIRATORY W GRAM STAIN

## 2018-03-19 LAB — CULTURE, BLOOD (ROUTINE X 2)
Culture: NO GROWTH
Culture: NO GROWTH
Special Requests: ADEQUATE
Special Requests: ADEQUATE

## 2018-03-19 LAB — AEROBIC/ANAEROBIC CULTURE (SURGICAL/DEEP WOUND): SPECIAL REQUESTS: NORMAL

## 2018-03-19 LAB — ACID FAST SMEAR (AFB): ACID FAST SMEAR - AFSCU2: NEGATIVE

## 2018-03-19 LAB — CULTURE, RESPIRATORY: CULTURE: NORMAL

## 2018-03-19 LAB — MAGNESIUM: Magnesium: 1.8 mg/dL (ref 1.7–2.4)

## 2018-03-19 NOTE — Progress Notes (Signed)
ID PROGRESS NOTE:  2 AFB smear negative. 1 pending, though pleural fluid analysis overwhelming c/w bacterial infection (high WBC, low glu), 10/21 cx showing growth on micro plate. Pending read today.  Will d/c airborne isolation Continue with IV piptazo Continue to monitor chest tube output  Derrick Beltran B. [place] [facility] [phone]

## 2018-03-19 NOTE — Plan of Care (Signed)

## 2018-03-19 NOTE — Progress Notes (Signed)
PROGRESS NOTE    Derrick Beltran  JJK:[id number] DOB: Jul 23, 1960 DOA: 03/14/2018 PCP: [name], PA-C   Brief Narrative:  57 year old with past medical history of tobacco use, medical noncompliance came to the hospital about 2 weeks ago with complains of progressive weakness fatigue and hemoptysis.  At that time CT of the chest showed abnormal right upper lobe with right-sided pleural effusion.  There was plans for thoracentesis but patient ended up leaving [place].  He returned to the ER again on 10/21 with similar complaints and agreed to stay for the admission.  IR was consulted and CT-guided right-sided [place] drain was placed on 10/21.  Cardiothoracic surgery, pulmonology and infectious disease were consulted on 10/22.  During the stay patient continues to have intermittent agitation requiring Ativan.  His AFB smear was negative therefore airborne precaution were discontinued.   Assessment & Plan:   Principal Problem:   Empyema ([place]) Active Problems:   Thrombocytosis (HCC)   Hyponatremia   Tobacco use   Malnutrition of moderate degree  Right-sided pleural empyema with hydropneumothorax Right upper lobe bronchiectasis - Chest tube is in place since 03/14/2018 draining purulent/serosanguineous fluid.  TPA performed by cardiothoracic surgery.  Hoping to get some results this way and to avoid VATS procedure as patient is also reluctant to do this.  Further management per cardiothoracic surgery team. -Chest x-ray from 10/24- stable position of pleural catheter, slightly decreased right basilar pneumothorax, stable right lung findings.  Repeat chest x-ray 10/25 shows stable right lung diffuse opacity. - Gram stain shows gram-negative rods but rest of the cultures and cytology are pending -HiV is negative - AFB smear-negative.  Discontinue airborne precaution per infectious disease. -Fungitell- negative -Aspergillus antibody- negative -Quantiferron TB - intermediate;  repeat testing -pending -Infectious disease consulted-appreciate input, repeat sputum cultures ordered -Currently on Zosyn. - No plans for bronchoscopy at this point, malignancy eval can be performed outpatient on nonemergent basis once this acute illnesses over. - Urine strep is negative -Chest tube management per cardiothoracic team.  Agitation and delirium - Alcohol withdrawal protocol in place.  Tobacco use - Counseled to quit using tobacco.  Alcohol use -Alcohol withdrawal protocol.  Multivitamins  Severe protein calorie malnutrition -Encourage oral intake  DVT prophylaxis: SCDs Code Status: Full code Family Communication: None at bedside Disposition Plan: Maintain inpatient stay until his empyema is better.  Chest tube management per cardiothoracic team.  Patient is at very high risk of leaving [place].  Consultants:  Interventional radiology Infectious disease Cardiothoracic surgery Pulmonary critical care medicine  Procedures:   CT-guided French catheter placement in the chest 10/21  Antimicrobials:   IV Zosyn   Subjective: Patient is calm sitting in the bed this morning.  Continues to ask when he can be discharged and his chest tube can be taken out.  Review of Systems Otherwise negative except as per HPI, including: General = no fevers, chills, dizziness, malaise, fatigue HEENT/EYES = negative for pain, redness, loss of vision, double vision, blurred vision, loss of hearing, sore throat, hoarseness, dysphagia Cardiovascular= negative for chest pain, palpitation, murmurs, lower extremity swelling Respiratory/lungs= negative for shortness of breath, cough, hemoptysis, wheezing, mucus production Gastrointestinal= negative for nausea, vomiting,, abdominal pain, melena, hematemesis Genitourinary= negative for Dysuria, Hematuria, Change in Urinary Frequency MSK = Negative for arthralgia, myalgias, Back Pain, Joint swelling  Neurology= Negative  for headache, seizures, numbness, tingling  Psychiatry= Negative for anxiety, depression, suicidal and homocidal ideation Allergy/Immunology= Medication/Food allergy as listed  Skin= Negative for  Rash, lesions, ulcers, itching     Objective: Vitals:   03/17/18 2108 03/18/18 0354 03/18/18 1541 03/18/18 2156  BP: 119/81 132/85 (!) 141/73 128/68  Pulse: 84 79 63 67  Resp: 19  18 20   Temp: 97.9 F (36.6 C)  97.9 F (36.6 C) 97.9 F (36.6 C)  TempSrc: Oral  Oral Oral  SpO2: 97% 96% 99% 99%  Weight:      Height:        Intake/Output Summary (Last 24 hours) at 03/19/2018 1208 Last data filed at 03/19/2018 0800 Gross per 24 hour  Intake 628.03 ml  Output 770 ml  Net -141.97 ml   Filed Weights   03/16/18 1603  Weight: 62.5 kg    Examination:  Constitutional: NAD, calm, comfortable Eyes: PERRL, lids and conjunctivae normal ENMT: Mucous membranes are moist. Posterior pharynx clear of any exudate or lesions.Normal dentition.  Neck: normal, supple, no masses, no thyromegaly Respiratory: Diminished breath sounds bilaterally Cardiovascular: Regular rate and rhythm, no murmurs / rubs / gallops. No extremity edema. 2+ pedal pulses. No carotid bruits.  Abdomen: no tenderness, no masses palpated. No hepatosplenomegaly. Bowel sounds positive.  Musculoskeletal: no clubbing / cyanosis. No joint deformity upper and lower extremities. Good ROM, no contractures. Normal muscle tone.  Skin: no rashes, lesions, ulcers. No induration Neurologic: CN 2-12 grossly intact. Sensation intact, DTR normal. Strength 5/5 in all 4.  Psychiatric: Normal judgment and insight. Alert and oriented x 3. Normal mood.  Chest is currently in place draining sanguinous fluid.  Data Reviewed:   CBC: Recent Labs  Lab 03/14/18 1033 03/15/18 0500 03/16/18 0148 03/18/18 0551 03/19/18 0336  WBC 13.4* 9.5 8.1 6.1 7.7  NEUTROABS  --  7.1  --   --   --   HGB 14.9 12.4* 12.1* 11.9* 13.3  HCT 49.7 39.4 39.7 39.7  44.3  MCV 90.4 88.1 88.6 88.4 89.0  PLT 633* 460* 497* 471* 446*   Basic Metabolic Panel: Recent Labs  Lab 03/14/18 1033 03/15/18 0500 03/16/18 0148 03/18/18 0551 03/19/18 0336  NA 131* 130* 130* 134* 134*  K 4.7 3.8 3.4* 3.2* 3.7  CL 91* 96* 96* 98 97*  CO2 29 28 27 28 30   GLUCOSE 167* 97 108* 103* 97  BUN 12 11 8  <5* <5*  CREATININE 1.10 0.85 0.77 0.68 0.79  CALCIUM 8.9 8.2* 8.0* 8.4* 8.4*  MG  --   --   --  1.8 1.8   GFR: Estimated Creatinine Clearance: 90.1 mL/min (by C-G formula based on SCr of 0.79 mg/dL). Liver Function Tests: Recent Labs  Lab 03/12/18 1241 03/16/18 0148  AST 16 13*  ALT 8 7  ALKPHOS 65 50  BILITOT 0.7 0.5  PROT 8.4* 7.3  ALBUMIN 1.8* 1.5*   No results for input(s): LIPASE, AMYLASE in the last 168 hours. No results for input(s): AMMONIA in the last 168 hours. Coagulation Profile: Recent Labs  Lab 03/14/18 1339  INR 1.31   Cardiac Enzymes: No results for input(s): CKTOTAL, CKMB, CKMBINDEX, TROPONINI in the last 168 hours. BNP (last 3 results) No results for input(s): PROBNP in the last 8760 hours. HbA1C: No results for input(s): HGBA1C in the last 72 hours. CBG: No results for input(s): GLUCAP in the last 168 hours. Lipid Profile: No results for input(s): CHOL, HDL, LDLCALC, TRIG, CHOLHDL, LDLDIRECT in the last 72 hours. Thyroid Function Tests: Recent Labs    03/18/18 0825  TSH 2.459   Anemia Panel: Recent Labs    03/18/18 0825  VITAMINB12 703   Sepsis Labs: No results for input(s): PROCALCITON, LATICACIDVEN in the last 168 hours.  Recent Results (from the past 240 hour(s))  Culture, blood (routine x 2) Call MD if unable to obtain prior to antibiotics being given     Status: None   Collection Time: 03/14/18  3:44 PM  Result Value Ref Range Status   Specimen Description BLOOD RIGHT ANTECUBITAL  Final   Special Requests   Final    BOTTLES DRAWN AEROBIC AND ANAEROBIC Blood Culture adequate volume   Culture   Final    NO  GROWTH 5 DAYS Performed at [facility], [address] [place], [place] [address]    Report Status 03/19/2018 FINAL  Final  Culture, blood (routine x 2) Call MD if unable to obtain prior to antibiotics being given     Status: None   Collection Time: 03/14/18  3:55 PM  Result Value Ref Range Status   Specimen Description BLOOD LEFT ANTECUBITAL  Final   Special Requests   Final    BOTTLES DRAWN AEROBIC AND ANAEROBIC Blood Culture adequate volume   Culture   Final    NO GROWTH 5 DAYS Performed at [facility], [place] [address], [place], [place] [address]    Report Status 03/19/2018 FINAL  Final  Acid Fast Smear (AFB)     Status: None   Collection Time: 03/14/18  4:18 PM  Result Value Ref Range Status   AFB Specimen Processing Concentration  Final   Acid Fast Smear Negative  Final    Comment: (NOTE) Performed At: [facility] [place], [place] [id number] Rush Farmer MD GD:[id number]    Source (AFB) PLEURAL  Final    Comment: Performed at [facility], [place] [address], [place], [place] [address]  Fungus Culture With Stain     Status: None (Preliminary result)   Collection Time: 03/14/18  4:18 PM  Result Value Ref Range Status   Fungus Stain Final report  Final    Comment: (NOTE) Performed At: [facility] [place], [place] [id number] Rush Farmer MD [id number]    Fungus (Mycology) Culture PENDING  Incomplete   Fungal Source PLEURAL  Final    Comment: Performed at [facility], [place] [address], [place], [place] [address]  Fungus Culture Result     Status: None   Collection Time: 03/14/18  4:18 PM  Result Value Ref Range Status   Result 1 Comment  Final    Comment: (NOTE) KOH/Calcofluor preparation:  no fungus observed. Performed At: [facility] [place], [place] [id number] Rush Farmer MD [id number]   Aerobic/Anaerobic Culture (surgical/deep wound)     Status: None  (Preliminary result)   Collection Time: 03/14/18  5:41 PM  Result Value Ref Range Status   Specimen Description ABSCESS  Final   Special Requests Normal  Final   Gram Stain   Final    ABUNDANT WBC PRESENT, PREDOMINANTLY PMN ABUNDANT GRAM NEGATIVE RODS    Culture   Final    CULTURE REINCUBATED FOR BETTER GROWTH HOLDING FOR POSSIBLE ANAEROBE Performed at [facility], [place] [address] [place], [place] [address]    Report Status PENDING  Incomplete  Culture, sputum-assessment     Status: None   Collection Time: 03/14/18 10:44 PM  Result Value Ref Range Status   Specimen Description EXPECTORATED SPUTUM  Final   Special Requests Normal  Final   Sputum evaluation  Final    THIS SPECIMEN IS ACCEPTABLE FOR SPUTUM CULTURE Performed at [facility], [place] [address], [place], [place] [address]    Report Status 03/16/2018 FINAL  Final  Culture, respiratory     Status: None   Collection Time: 03/14/18 10:44 PM  Result Value Ref Range Status   Specimen Description EXPECTORATED SPUTUM  Final   Special Requests Normal Reflexed from [id number]  Final   Gram Stain   Final    ABUNDANT WBC PRESENT,BOTH PMN AND MONONUCLEAR FEW GRAM POSITIVE COCCI FEW GRAM VARIABLE ROD FEW SQUAMOUS EPITHELIAL CELLS PRESENT    Culture   Final    Consistent with normal respiratory flora. Performed at [facility], [place] [address] [place], [place] [address]    Report Status 03/18/2018 FINAL  Final  Acid Fast Smear (AFB)     Status: None   Collection Time: 03/17/18 12:57 AM  Result Value Ref Range Status   AFB Specimen Processing Concentration  Final   Acid Fast Smear Negative  Final    Comment: (NOTE) Performed At: [facility] [place], [place] [id number] Rush Farmer MD [id number]    Source (AFB) EXPECTORATED SPUTUM  Final    Comment: Performed at [facility], [place] [address] [place], [place] [address]  Expectorated sputum assessment w rflx to resp cult      Status: None   Collection Time: 03/17/18 12:58 AM  Result Value Ref Range Status   Specimen Description EXPECTORATED SPUTUM  Final   Special Requests NONE  Final   Sputum evaluation   Final    THIS SPECIMEN IS ACCEPTABLE FOR SPUTUM CULTURE Performed at [facility], [place] [address] [place], [place] [address]    Report Status 03/17/2018 FINAL  Final  Culture, respiratory     Status: None   Collection Time: 03/17/18 12:58 AM  Result Value Ref Range Status   Specimen Description EXPECTORATED SPUTUM  Final   Special Requests NONE Reflexed from [id number]  Final   Gram Stain   Final    ABUNDANT WBC PRESENT, PREDOMINANTLY PMN RARE GRAM VARIABLE ROD    Culture   Final    FEW Consistent with normal respiratory flora. Performed at [facility], [place] [address], [place], [place] [address]    Report Status 03/19/2018 FINAL  Final  Expectorated sputum assessment w rflx to resp cult     Status: None   Collection Time: 03/17/18 10:30 AM  Result Value Ref Range Status   Specimen Description SPUTUM  Final   Special Requests NONE  Final   Sputum evaluation   Final    Sputum specimen not acceptable for testing.  Please recollect.   RESULT CALLED TO, READ BACK BY AND VERIFIED WITH: PERRY RN AT 1143 ON [id number] BY SJW Performed at [facility], [place] [address], [place], [place] [address]    Report Status 03/18/2018 FINAL  Final  Acid Fast Smear (AFB)     Status: None   Collection Time: 03/18/18 10:15 AM  Result Value Ref Range Status   AFB Specimen Processing Concentration  Final   Acid Fast Smear Negative  Final    Comment: (NOTE) Performed At: [facility] [place], [place] [id number] Rush Farmer MD [id number]    Source (AFB) SPUTUM  Final    Comment: Performed at [facility], [place] [address], [place], [place] [address]  Radiology Studies: Dg Chest Port 1 View  Result Date: 03/18/2018 CLINICAL DATA:  Empyema of right lung.  EXAM: PORTABLE CHEST 1 VIEW COMPARISON:  Radiograph of March 17, 2018. FINDINGS: The heart size and mediastinal contours are within normal limits. Left lung is clear. Stable diffuse opacity seen in right lung consistent with chronic inflammation or scarring. Stable position of right basilar chest tube. Stable mild right basilar pneumothorax is noted. The visualized skeletal structures are unremarkable. IMPRESSION: Stable position of right basilar chest tube with mild right basilar pneumothorax. Stable diffuse right lung opacity as described above. Electronically Signed   By: Marijo Conception, M.D.   On: 03/18/2018 10:21        Scheduled Meds: . folic acid  1 mg Oral Daily  . hydrocortisone   Rectal BID  . lidocaine  1 patch Transdermal Q24H  . multivitamin with minerals  1 tablet Oral Daily  . nicotine  21 mg Transdermal Daily  . nystatin  5 mL Oral QID  . thiamine  100 mg Oral Daily   Or  . thiamine  100 mg Intravenous Daily   Continuous Infusions: . piperacillin-tazobactam (ZOSYN)  IV 3.375 g (03/19/18 0517)     LOS: 5 days   Time spent= 20 mins    Ankit Arsenio Loader, MD [facility] Pager [phone]   If 7PM-7AM, please contact night-coverage [email or web address] Password [email or web address] 03/19/2018, 12:08 PM

## 2018-03-19 NOTE — Progress Notes (Signed)
  Subjective: IR placed right chest tube for pneumonia loculated effusion-empyema Chest tube drainage increased to > 200 cc yesterday after instillation of lytic therapy We will check chest x-ray in a.m. and continue suction on tube today Objective: Vital signs in last 24 hours: Temp:  [97.9 F (36.6 C)] 97.9 F (36.6 C) (10/25 2156) Pulse Rate:  [63-67] 67 (10/25 2156) Resp:  [18-20] 20 (10/25 2156) BP: (128-141)/(68-73) 128/68 (10/25 2156) SpO2:  [99 %] 99 % (10/25 2156)  Hemodynamic parameters for last 24 hours:    Intake/Output from previous day: 10/25 0701 - 10/26 0700 In: 508 [P.O.:300; IV Piggyback:208] Out: 620 [Urine:400; Chest Tube:220] Intake/Output this shift: Total I/O In: 120 [P.O.:120] Out: 150 [Urine:150]    Lab Results: Recent Labs    03/18/18 0551 03/19/18 0336  WBC 6.1 7.7  HGB 11.9* 13.3  HCT 39.7 44.3  PLT 471* 469*   BMET:  Recent Labs    03/18/18 0551 03/19/18 0336  NA 134* 134*  K 3.2* 3.7  CL 98 97*  CO2 28 30  GLUCOSE 103* 97  BUN <5* <5*  CREATININE 0.68 0.79  CALCIUM 8.4* 8.4*    PT/INR: No results for input(s): LABPROT, INR in the last 72 hours. ABG No results found for: PHART, HCO3, TCO2, ACIDBASEDEF, O2SAT CBG (last 3)  No results for input(s): GLUCAP in the last 72 hours.  Assessment/Plan: S/P Pigtail placement right pleural space for subpulmonic loculated effusion-empyema Continue current antibiotics and chest tube management Follow-up chest x-ray in a.m.  LOS: 5 days    Derrick Beltran 03/19/2018

## 2018-03-20 ENCOUNTER — Inpatient Hospital Stay (HOSPITAL_COMMUNITY): Payer: No Typology Code available for payment source

## 2018-03-20 LAB — CBC
HEMATOCRIT: 41.7 % (ref 39.0–52.0)
HEMOGLOBIN: 12.8 g/dL — AB (ref 13.0–17.0)
MCH: 26.9 pg (ref 26.0–34.0)
MCHC: 30.7 g/dL (ref 30.0–36.0)
MCV: 87.8 fL (ref 80.0–100.0)
Platelets: 455 10*3/uL — ABNORMAL HIGH (ref 150–400)
RBC: 4.75 MIL/uL (ref 4.22–5.81)
RDW: 14.1 % (ref 11.5–15.5)
WBC: 6.6 10*3/uL (ref 4.0–10.5)
nRBC: 0 % (ref 0.0–0.2)

## 2018-03-20 LAB — BASIC METABOLIC PANEL
Anion gap: 5 (ref 5–15)
CHLORIDE: 99 mmol/L (ref 98–111)
CO2: 29 mmol/L (ref 22–32)
CREATININE: 0.92 mg/dL (ref 0.61–1.24)
Calcium: 8.2 mg/dL — ABNORMAL LOW (ref 8.9–10.3)
GFR calc Af Amer: >60 mL/min (ref >60)
GLUCOSE: 97 mg/dL (ref 70–99)
POTASSIUM: 3.5 mmol/L (ref 3.5–5.1)
SODIUM: 133 mmol/L — AB (ref 135–145)

## 2018-03-20 LAB — MAGNESIUM: Magnesium: 1.9 mg/dL (ref 1.7–2.4)

## 2018-03-20 MED ORDER — SODIUM CHLORIDE 0.9 % IV SOLN
3.0000 g | Freq: Four times a day (QID) | INTRAVENOUS | Status: DC
  Administered 2018-03-20 – 2018-03-22 (×10): 3 g via INTRAVENOUS
  Filled 2018-03-20 (×11): qty 3

## 2018-03-20 NOTE — Progress Notes (Signed)
ID PROGRESS NOTE   Cultures grew prevotella, oral pathogen, not surprising given indolent presentation with impressive CXR.   Will switch to unasyn, and d/c piptazo.  continue IV therapy while he is hospitalized Defer to CT surgery if/when patient needs VATS\  Caren [facility] [phone]

## 2018-03-20 NOTE — Progress Notes (Signed)
PROGRESS NOTE    Derrick Beltran  EVO:[id number] DOB: Jun 02, 1960 DOA: 03/14/2018 PCP: [name], PA-C   Brief Narrative:  57 year old with past medical history of tobacco use, medical noncompliance came to the hospital about 2 weeks ago with complains of progressive weakness fatigue and hemoptysis.  At that time CT of the chest showed abnormal right upper lobe with right-sided pleural effusion.  There was plans for thoracentesis but patient ended up leaving [place].  He returned to the ER again on 10/21 with similar complaints and agreed to stay for the admission.  IR was consulted and CT-guided right-sided [place] drain was placed on 10/21.  Cardiothoracic surgery, pulmonology and infectious disease were consulted on 10/22.  During the stay patient continues to have intermittent agitation requiring Ativan.  His AFB smear was negative therefore airborne precaution were discontinued.  Zosyn was changed to Unasyn on 03/20/2018.   Assessment & Plan:   Principal Problem:   Empyema ([place]) Active Problems:   Thrombocytosis (HCC)   Hyponatremia   Tobacco use   Malnutrition of moderate degree  Right-sided pleural empyema with hydropneumothorax Right upper lobe bronchiectasis - Chest tube is in place since 03/14/2018 draining purulent/serosanguineous fluid.  TPA performed by cardiothoracic surgery.  Hoping to get some results this way and to avoid VATS procedure as patient is also reluctant to do this.  Further management per cardiothoracic surgery team. -Chest x-ray from 10/24- stable position of pleural catheter, slightly decreased right basilar pneumothorax, stable right lung findings.  Repeat chest x-ray 10/25 shows stable right lung diffuse opacity. - Cx= Prevotella (oral pathogen),  -HiV is negative - AFB smear-negative.  Discontinue airborne precaution per infectious disease. -Fungitell- negative -Aspergillus antibody- negative -Quantiferron TB - intermediate -Infectious  disease consulted-appreciate input, repeat sputum cultures ordered -Switched Zosyn to Unasyn on 10/27 - No plans for bronchoscopy at this point, malignancy eval can be performed outpatient on nonemergent basis once this acute illnesses over. - Urine strep is negative -Chest tube management per cardiothoracic team.  Agitation and delirium with history of alcohol use - Continue alcohol withdrawal protocol  Tobacco use - Counseled to quit using tobacco.  Severe protein calorie malnutrition -Encourage oral intake  DVT prophylaxis: SCDs Code Status: Full code Family Communication: None at bedside Disposition Plan: Maintain inpatient stay until his empyema is better.  Patient is at very high risk of leaving [place].  Consultants:  Interventional radiology Infectious disease Cardiothoracic surgery Pulmonary critical care medicine  Procedures:   CT-guided French catheter placement in the chest 10/21  Antimicrobials:   IV Zosyn   Subjective: Patient is sitting up in his bed and continues to ask when he can go home.  No other acute events overnight.  Review of Systems Otherwise negative except as per HPI, including: General = no fevers, chills, dizziness, malaise, fatigue HEENT/EYES = negative for pain, redness, loss of vision, double vision, blurred vision, loss of hearing, sore throat, hoarseness, dysphagia Cardiovascular= negative for chest pain, palpitation, murmurs, lower extremity swelling Respiratory/lungs= negative for shortness of breath, cough, hemoptysis, wheezing, mucus production Gastrointestinal= negative for nausea, vomiting,, abdominal pain, melena, hematemesis Genitourinary= negative for Dysuria, Hematuria, Change in Urinary Frequency MSK = Negative for arthralgia, myalgias, Back Pain, Joint swelling  Neurology= Negative for headache, seizures, numbness, tingling  Psychiatry= Negative for anxiety, depression, suicidal and homocidal  ideation Allergy/Immunology= Medication/Food allergy as listed  Skin= Negative for Rash, lesions, ulcers, itching      Objective: Vitals:   03/18/18 2156 03/19/18  1426 03/19/18 2202 03/20/18 0540  BP: 128/68 124/76 140/74 140/75  Pulse: 67 66 (!) 50 (!) 57  Resp: 20  18 18   Temp: 97.9 F (36.6 C) 98.3 F (36.8 C) 98.4 F (36.9 C) (!) 97.5 F (36.4 C)  TempSrc: Oral Oral Oral Oral  SpO2: 99% 98% 98% 97%  Weight:      Height:        Intake/Output Summary (Last 24 hours) at 03/20/2018 1052 Last data filed at 03/20/2018 0525 Gross per 24 hour  Intake 617.18 ml  Output 830 ml  Net -212.82 ml   Filed Weights   03/16/18 1603  Weight: 62.5 kg    Examination:  Constitutional: NAD, calm, comfortable Eyes: PERRL, lids and conjunctivae normal ENMT: Mucous membranes are moist. Posterior pharynx clear of any exudate or lesions.Normal dentition.  Neck: normal, supple, no masses, no thyromegaly Respiratory: Diminished breath sounds bilaterally Cardiovascular: Regular rate and rhythm, no murmurs / rubs / gallops. No extremity edema. 2+ pedal pulses. No carotid bruits.  Abdomen: no tenderness, no masses palpated. No hepatosplenomegaly. Bowel sounds positive.  Musculoskeletal: no clubbing / cyanosis. No joint deformity upper and lower extremities. Good ROM, no contractures. Normal muscle tone.  Skin: no rashes, lesions, ulcers. No induration Neurologic: CN 2-12 grossly intact. Sensation intact, DTR normal. Strength 5/5 in all 4.  Psychiatric: Normal judgment and insight. Alert and oriented x 3. Normal mood.  Chest tube in place  Data Reviewed:   CBC: Recent Labs  Lab 03/15/18 0500 03/16/18 0148 03/18/18 0551 03/19/18 0336 03/20/18 0324  WBC 9.5 8.1 6.1 7.7 6.6  NEUTROABS 7.1  --   --   --   --   HGB 12.4* 12.1* 11.9* 13.3 12.8*  HCT 39.4 39.7 39.7 44.3 41.7  MCV 88.1 88.6 88.4 89.0 87.8  PLT 460* 497* 471* 469* 045*   Basic Metabolic Panel: Recent Labs  Lab  03/15/18 0500 03/16/18 0148 03/18/18 0551 03/19/18 0336 03/20/18 0324  NA 130* 130* 134* 134* 133*  K 3.8 3.4* 3.2* 3.7 3.5  CL 96* 96* 98 97* 99  CO2 28 27 28 30 29   GLUCOSE 97 108* 103* 97 97  BUN 11 8 <5* <5* <5*  CREATININE 0.85 0.77 0.68 0.79 0.92  CALCIUM 8.2* 8.0* 8.4* 8.4* 8.2*  MG  --   --  1.8 1.8 1.9   GFR: Estimated Creatinine Clearance: 78.3 mL/min (by C-G formula based on SCr of 0.92 mg/dL). Liver Function Tests: Recent Labs  Lab 03/16/18 0148  AST 13*  ALT 7  ALKPHOS 50  BILITOT 0.5  PROT 7.3  ALBUMIN 1.5*   No results for input(s): LIPASE, AMYLASE in the last 168 hours. No results for input(s): AMMONIA in the last 168 hours. Coagulation Profile: Recent Labs  Lab 03/14/18 1339  INR 1.31   Cardiac Enzymes: No results for input(s): CKTOTAL, CKMB, CKMBINDEX, TROPONINI in the last 168 hours. BNP (last 3 results) No results for input(s): PROBNP in the last 8760 hours. HbA1C: No results for input(s): HGBA1C in the last 72 hours. CBG: No results for input(s): GLUCAP in the last 168 hours. Lipid Profile: No results for input(s): CHOL, HDL, LDLCALC, TRIG, CHOLHDL, LDLDIRECT in the last 72 hours. Thyroid Function Tests: Recent Labs    03/18/18 0825  TSH 2.459   Anemia Panel: Recent Labs    03/18/18 0825  VITAMINB12 703   Sepsis Labs: No results for input(s): PROCALCITON, LATICACIDVEN in the last 168 hours.  Recent Results (from the past  240 hour(s))  Culture, blood (routine x 2) Call MD if unable to obtain prior to antibiotics being given     Status: None   Collection Time: 03/14/18  3:44 PM  Result Value Ref Range Status   Specimen Description BLOOD RIGHT ANTECUBITAL  Final   Special Requests   Final    BOTTLES DRAWN AEROBIC AND ANAEROBIC Blood Culture adequate volume   Culture   Final    NO GROWTH 5 DAYS Performed at [facility], [place] [address] [place], [place] [address]    Report Status 03/19/2018 FINAL  Final  Culture, blood  (routine x 2) Call MD if unable to obtain prior to antibiotics being given     Status: None   Collection Time: 03/14/18  3:55 PM  Result Value Ref Range Status   Specimen Description BLOOD LEFT ANTECUBITAL  Final   Special Requests   Final    BOTTLES DRAWN AEROBIC AND ANAEROBIC Blood Culture adequate volume   Culture   Final    NO GROWTH 5 DAYS Performed at [facility], [place] [address] [place], [place] [address]    Report Status 03/19/2018 FINAL  Final  Acid Fast Smear (AFB)     Status: None   Collection Time: 03/14/18  4:18 PM  Result Value Ref Range Status   AFB Specimen Processing Concentration  Final   Acid Fast Smear Negative  Final    Comment: (NOTE) Performed At: [facility] [place], [place] [id number] Rush Farmer MD GY:[id number]    Source (AFB) PLEURAL  Final    Comment: Performed at [facility], [place] [address], [place], [place] [address]  Fungus Culture With Stain     Status: None (Preliminary result)   Collection Time: 03/14/18  4:18 PM  Result Value Ref Range Status   Fungus Stain Final report  Final    Comment: (NOTE) Performed At: [facility] [place], [place] [id number] Rush Farmer MD SV:[id number]    Fungus (Mycology) Culture PENDING  Incomplete   Fungal Source PLEURAL  Final    Comment: Performed at [facility], [place] [address], [place], [place] [address]  Fungus Culture Result     Status: None   Collection Time: 03/14/18  4:18 PM  Result Value Ref Range Status   Result 1 Comment  Final    Comment: (NOTE) KOH/Calcofluor preparation:  no fungus observed. Performed At: [facility] [place], [place] [id number] Rush Farmer MD KT:[id number]   Aerobic/Anaerobic Culture (surgical/deep wound)     Status: None   Collection Time: 03/14/18  5:41 PM  Result Value Ref Range Status   Specimen Description ABSCESS  Final   Special Requests Normal  Final   Gram Stain    Final    ABUNDANT WBC PRESENT, PREDOMINANTLY PMN ABUNDANT GRAM NEGATIVE RODS    Culture   Final    MODERATE PREVOTELLA SPECIES BETA LACTAMASE POSITIVE Performed at [facility], [address] [place], [place] [address]    Report Status 03/19/2018 FINAL  Final  Culture, sputum-assessment     Status: None   Collection Time: 03/14/18 10:44 PM  Result Value Ref Range Status   Specimen Description EXPECTORATED SPUTUM  Final   Special Requests Normal  Final   Sputum evaluation   Final    THIS SPECIMEN IS ACCEPTABLE FOR SPUTUM CULTURE Performed at [facility], [place] [address] [place], [place] [address]  Report Status 03/16/2018 FINAL  Final  Culture, respiratory     Status: None   Collection Time: 03/14/18 10:44 PM  Result Value Ref Range Status   Specimen Description EXPECTORATED SPUTUM  Final   Special Requests Normal Reflexed from [id number]  Final   Gram Stain   Final    ABUNDANT WBC PRESENT,BOTH PMN AND MONONUCLEAR FEW GRAM POSITIVE COCCI FEW GRAM VARIABLE ROD FEW SQUAMOUS EPITHELIAL CELLS PRESENT    Culture   Final    Consistent with normal respiratory flora. Performed at [facility], [place] [address], [place], [place] [address]    Report Status 03/18/2018 FINAL  Final  Acid Fast Smear (AFB)     Status: None   Collection Time: 03/17/18 12:57 AM  Result Value Ref Range Status   AFB Specimen Processing Concentration  Final   Acid Fast Smear Negative  Final    Comment: (NOTE) Performed At: [facility] [place], [place] [id number] Rush Farmer MD EX:[id number]    Source (AFB) EXPECTORATED SPUTUM  Final    Comment: Performed at [facility], [place] [address] [place], [place] [address]  Expectorated sputum assessment w rflx to resp cult     Status: None   Collection Time: 03/17/18 12:58 AM  Result Value Ref Range Status   Specimen Description EXPECTORATED SPUTUM  Final   Special Requests NONE  Final   Sputum evaluation    Final    THIS SPECIMEN IS ACCEPTABLE FOR SPUTUM CULTURE Performed at [facility], [place] [address] [place], [place] [address]    Report Status 03/17/2018 FINAL  Final  Culture, respiratory     Status: None   Collection Time: 03/17/18 12:58 AM  Result Value Ref Range Status   Specimen Description EXPECTORATED SPUTUM  Final   Special Requests NONE Reflexed from [id number]  Final   Gram Stain   Final    ABUNDANT WBC PRESENT, PREDOMINANTLY PMN RARE GRAM VARIABLE ROD    Culture   Final    FEW Consistent with normal respiratory flora. Performed at [facility], [place] [address], [place], [place] [address]    Report Status 03/19/2018 FINAL  Final  Expectorated sputum assessment w rflx to resp cult     Status: None   Collection Time: 03/17/18 10:30 AM  Result Value Ref Range Status   Specimen Description SPUTUM  Final   Special Requests NONE  Final   Sputum evaluation   Final    Sputum specimen not acceptable for testing.  Please recollect.   RESULT CALLED TO, READ BACK BY AND VERIFIED WITH: PERRY RN AT 1143 ON [id number] BY SJW Performed at [facility], [place] [address] [place], [place] [address]    Report Status 03/18/2018 FINAL  Final  Acid Fast Smear (AFB)     Status: None   Collection Time: 03/18/18 10:15 AM  Result Value Ref Range Status   AFB Specimen Processing Concentration  Final   Acid Fast Smear Negative  Final    Comment: (NOTE) Performed At: [facility] [place], [place] [id number] Rush Farmer MD DT:[id number]    Source (AFB) SPUTUM  Final    Comment: Performed at [facility], [place] [address] [place], [place] [address]         Radiology Studies: Dg Chest Port 1 View  Result Date: 03/20/2018 CLINICAL DATA:  Followup right chest tube and mild right basilar pneumothorax. EXAM: PORTABLE CHEST 1  VIEW COMPARISON:  03/18/2018. FINDINGS: A small caliber pigtail chest tube remains at the right lung base. Approximately 10% right  basilar pneumothorax without significant overall change in size. No significant change in right pleural fluid and right lung airspace opacity and linear density. The left lung remains clear with stable mildly prominent interstitial markings. Thoracic spine degenerative changes. Normal sized heart. IMPRESSION: 1. Stable 10% right basilar pneumothorax. 2. Stable right pleural fluid and right lung atelectasis/pneumonia. 3. Stable mild chronic interstitial lung disease. Electronically Signed   By: Claudie Revering M.D.   On: 03/20/2018 09:36        Scheduled Meds: . folic acid  1 mg Oral Daily  . hydrocortisone   Rectal BID  . lidocaine  1 patch Transdermal Q24H  . multivitamin with minerals  1 tablet Oral Daily  . nicotine  21 mg Transdermal Daily  . nystatin  5 mL Oral QID  . thiamine  100 mg Oral Daily   Or  . thiamine  100 mg Intravenous Daily   Continuous Infusions: . ampicillin-sulbactam (UNASYN) IV 3 g (03/20/18 0956)     LOS: 6 days   Time spent= 22 mins    Ankit Arsenio Loader, MD [facility] Pager [phone]   If 7PM-7AM, please contact night-coverage [email or web address] Password [email or web address] 03/20/2018, 10:52 AM

## 2018-03-21 ENCOUNTER — Inpatient Hospital Stay (HOSPITAL_COMMUNITY): Payer: No Typology Code available for payment source

## 2018-03-21 ENCOUNTER — Telehealth: Payer: Self-pay | Admitting: Physician Assistant

## 2018-03-21 DIAGNOSIS — K0889 Other specified disorders of teeth and supporting structures: Secondary | ICD-10-CM

## 2018-03-21 DIAGNOSIS — J850 Gangrene and necrosis of lung: Secondary | ICD-10-CM

## 2018-03-21 DIAGNOSIS — E440 Moderate protein-calorie malnutrition: Secondary | ICD-10-CM

## 2018-03-21 DIAGNOSIS — Z978 Presence of other specified devices: Secondary | ICD-10-CM

## 2018-03-21 DIAGNOSIS — M549 Dorsalgia, unspecified: Secondary | ICD-10-CM

## 2018-03-21 DIAGNOSIS — B9689 Other specified bacterial agents as the cause of diseases classified elsewhere: Secondary | ICD-10-CM

## 2018-03-21 LAB — BASIC METABOLIC PANEL
ANION GAP: 5 (ref 5–15)
CO2: 28 mmol/L (ref 22–32)
Calcium: 7.9 mg/dL — ABNORMAL LOW (ref 8.9–10.3)
Chloride: 102 mmol/L (ref 98–111)
Creatinine, Ser: 0.77 mg/dL (ref 0.61–1.24)
GFR calc Af Amer: >60 mL/min (ref >60)
GLUCOSE: 83 mg/dL (ref 70–99)
POTASSIUM: 3.6 mmol/L (ref 3.5–5.1)
Sodium: 135 mmol/L (ref 135–145)

## 2018-03-21 LAB — CBC
HEMATOCRIT: 42.2 % (ref 39.0–52.0)
Hemoglobin: 12.7 g/dL — ABNORMAL LOW (ref 13.0–17.0)
MCH: 26.3 pg (ref 26.0–34.0)
MCHC: 30.1 g/dL (ref 30.0–36.0)
MCV: 87.4 fL (ref 80.0–100.0)
Platelets: 447 10*3/uL — ABNORMAL HIGH (ref 150–400)
RBC: 4.83 MIL/uL (ref 4.22–5.81)
RDW: 14 % (ref 11.5–15.5)
WBC: 7.1 10*3/uL (ref 4.0–10.5)
nRBC: 0 % (ref 0.0–0.2)

## 2018-03-21 LAB — FOLATE RBC
FOLATE, RBC: 1032 ng/mL (ref >498)
Folate, Hemolysate: 406.5 ng/mL
Hematocrit: 39.4 % (ref 37.5–51.0)

## 2018-03-21 LAB — MAGNESIUM: MAGNESIUM: 1.7 mg/dL (ref 1.7–2.4)

## 2018-03-21 NOTE — Progress Notes (Signed)
Referring Physician(s): Dr. Reesa Chew  Supervising Physician: Dr. Annamaria Boots  Patient Status:  [facility] - In-pt  Chief Complaint: Follow-up right chest tube placement 10/21 with Dr. Anselm Pancoast due to empyema  Subjective: Patient states he feels fine, denies dyspnea, cough or chest pain. States appetite is "alright."  Over 147mL output recorded for each of the last 2 days.  Allergies: Patient has no known allergies.  Medications:  Current Facility-Administered Medications:  .  alum & mag hydroxide-simeth (MAALOX/MYLANTA) 200-200-20 MG/5ML suspension 30 mL, 30 mL, Oral, Q6H PRN, Karmen Bongo, MD, 30 mL at 03/14/18 2021 .  Ampicillin-Sulbactam (UNASYN) 3 g in sodium chloride 0.9 % 100 mL IVPB, 3 g, Intravenous, Q6H, Carlyle Basques, MD, Last Rate: 200 mL/hr at 03/21/18 0813, 3 g at 03/21/18 0813 .  folic acid (FOLVITE) tablet 1 mg, 1 mg, Oral, Daily, Karmen Bongo, MD, 1 mg at 03/20/18 0951 .  HYDROcodone-acetaminophen (NORCO/VICODIN) 5-325 MG per tablet 1-2 tablet, 1-2 tablet, Oral, Q4H PRN, Schorr, Rhetta Mura, NP, 2 tablet at 03/21/18 0353 .  hydrocortisone (ANUSOL-HC) 2.5 % rectal cream, , Rectal, BID, Amin, Ankit Chirag, MD .  lidocaine (LIDODERM) 5 % 1 patch, 1 patch, Transdermal, Q24H, Schorr, Rhetta Mura, NP, 1 patch at 03/20/18 2152 .  LORazepam (ATIVAN) tablet 2 mg, 2 mg, Oral, Q6H PRN, Amin, Ankit Chirag, MD, 2 mg at 03/18/18 1753 .  multivitamin with minerals tablet 1 tablet, 1 tablet, Oral, Daily, Karmen Bongo, MD, 1 tablet at 03/20/18 1010 .  nicotine (NICODERM CQ - dosed in mg/24 hours) patch 21 mg, 21 mg, Transdermal, Daily, Amin, Ankit Chirag, MD, 21 mg at 03/18/18 1142 .  nystatin (MYCOSTATIN) 100000 UNIT/ML suspension 500,000 Units, 5 mL, Oral, QID, Karmen Bongo, MD, 500,000 Units at 03/20/18 2152 .  thiamine (VITAMIN B-1) tablet 100 mg, 100 mg, Oral, Daily, 100 mg at 03/20/18 0951 **OR** thiamine (B-1) injection 100 mg, 100 mg, Intravenous, Daily, Karmen Bongo,  MD    Vital Signs: BP 130/75 (BP Location: Right Arm)   Pulse (!) 59   Temp 97.7 F (36.5 C) (Oral)   Resp 20   Ht 5\' 9"  (1.753 m)   Wt 62.5 kg   SpO2 99%   BMI 20.35 kg/m   Physical Exam  Constitutional: No distress.  Cardiovascular: Normal rate, regular rhythm and normal heart sounds.  Pulmonary/Chest: Effort normal. No accessory muscle usage. No respiratory distress. He has decreased breath sounds in the right lower field.  Abdominal: Soft. He exhibits no distension. There is no tenderness.  Neurological: He is alert.  Skin: Skin is warm and dry. He is not diaphoretic.  Nursing note and vitals reviewed.   Imaging: Dg Chest Port 1 View  Result Date: 03/21/2018 CLINICAL DATA:  57 year old male with a history of empyema EXAM: PORTABLE CHEST 1 VIEW COMPARISON:  Multiple prior most recent 03/20/2018 FINDINGS: Cardiomediastinal silhouette unchanged with the right heart border and mediastinal border obscured by overlying lung/pleural disease. Left lung remains well aerated. Unchanged position of pigtail drainage catheter terminating at the right lung base at the cardiophrenic angle. Similar pattern of mixed airspace and interstitial opacity with intervening residual lucency of the right lung. Pleuroparenchymal thickening is unchanged. Lucency at the right lung base less conspicuous than the comparison. IMPRESSION: Unchanged right pigtail thoracostomy tube. Decreasing conspicuity of the lucency at the right lung base, however, a residual hydropneumothorax is difficult to exclude. Similar appearance of mixed interstitial and airspace opacity of the right lung, with residual pleural fluid/thickening. Electronically Signed  By: Corrie Mckusick D.O.   On: 03/21/2018 08:44   Dg Chest Port 1 View  Result Date: 03/20/2018 CLINICAL DATA:  Followup right chest tube and mild right basilar pneumothorax. EXAM: PORTABLE CHEST 1 VIEW COMPARISON:  03/18/2018. FINDINGS: A small caliber pigtail chest  tube remains at the right lung base. Approximately 10% right basilar pneumothorax without significant overall change in size. No significant change in right pleural fluid and right lung airspace opacity and linear density. The left lung remains clear with stable mildly prominent interstitial markings. Thoracic spine degenerative changes. Normal sized heart. IMPRESSION: 1. Stable 10% right basilar pneumothorax. 2. Stable right pleural fluid and right lung atelectasis/pneumonia. 3. Stable mild chronic interstitial lung disease. Electronically Signed   By: Claudie Revering M.D.   On: 03/20/2018 09:36   Dg Chest Port 1 View  Result Date: 03/18/2018 CLINICAL DATA:  Empyema of right lung. EXAM: PORTABLE CHEST 1 VIEW COMPARISON:  Radiograph of March 17, 2018. FINDINGS: The heart size and mediastinal contours are within normal limits. Left lung is clear. Stable diffuse opacity seen in right lung consistent with chronic inflammation or scarring. Stable position of right basilar chest tube. Stable mild right basilar pneumothorax is noted. The visualized skeletal structures are unremarkable. IMPRESSION: Stable position of right basilar chest tube with mild right basilar pneumothorax. Stable diffuse right lung opacity as described above. Electronically Signed   By: Marijo Conception, M.D.   On: 03/18/2018 10:21    Labs:  CBC: Recent Labs    03/18/18 0551 03/19/18 0336 03/20/18 0324 03/21/18 0324  WBC 6.1 7.7 6.6 7.1  HGB 11.9* 13.3 12.8* 12.7*  HCT 39.7 44.3 41.7 42.2  PLT 471* 469* 455* 447*    COAGS: Recent Labs    03/04/18 1015 03/14/18 1339  INR 1.28 1.31    BMP: Recent Labs    03/18/18 0551 03/19/18 0336 03/20/18 0324 03/21/18 0324  NA 134* 134* 133* 135  K 3.2* 3.7 3.5 3.6  CL 98 97* 99 102  CO2 28 30 29 28   GLUCOSE 103* 97 97 83  BUN <5* <5* <5* <5*  CALCIUM 8.4* 8.4* 8.2* 7.9*  CREATININE 0.68 0.79 0.92 0.77  GFRNONAA >60 >60 >60 >60  GFRAA >60 >60 >60 >60    LIVER FUNCTION  TESTS: Recent Labs    03/12/18 1241 03/16/18 0148  BILITOT 0.7 0.5  AST 16 13*  ALT 8 7  ALKPHOS 65 50  PROT 8.4* 7.3  ALBUMIN 1.8* 1.5*    Assessment and Plan: S/p right chest tube placement 10/21 with Dr. Anselm Pancoast due to empyema. Anteplase/doranse infusion per cardiothoracic surgery x 3. CXR today stable Await further input from Thoracic surgery.  Electronically Signed: Ascencion Dike, PA-C 03/21/2018, 9:33 AM   I spent a total of 15 Minutes at the the patient's bedside AND on the patient's hospital floor or unit, greater than 50% of which was counseling/coordinating care for right chest tube.

## 2018-03-21 NOTE — Progress Notes (Signed)
Patient ID: Derrick Beltran, male   DOB: Dec 27, 1960, 57 y.o.   MRN: [id number]         [facility]  Date of Admission:  03/14/2018   Total days of antibiotics 8        Day 2 ampicillin sulbactam         ASSESSMENT: He has chronic necrotizing pneumonia and right pleural empyema.  Pleural fluid culture is growing Prevatolla.  I will continue ampicillin sulbactam while here.  Course of amoxicillin clavulanate after discharge.  PLAN: 1. Continue ampicillin sulbactam  Principal Problem:   Empyema (HCC) Active Problems:   Thrombocytosis (HCC)   Hyponatremia   Tobacco use   Malnutrition of moderate degree   Scheduled Meds: . folic acid  1 mg Oral Daily  . hydrocortisone   Rectal BID  . lidocaine  1 patch Transdermal Q24H  . multivitamin with minerals  1 tablet Oral Daily  . nicotine  21 mg Transdermal Daily  . nystatin  5 mL Oral QID  . thiamine  100 mg Oral Daily   Or  . thiamine  100 mg Intravenous Daily   Continuous Infusions: . ampicillin-sulbactam (UNASYN) IV 3 g (03/21/18 0813)   PRN Meds:.alum & mag hydroxide-simeth, HYDROcodone-acetaminophen, LORazepam   SUBJECTIVE: He states repeatedly that he wants his chest tube out and he wants to go home.  He says that he does not want any surgery.  Review of Systems: Review of Systems  Constitutional: Positive for weight loss. Negative for chills, diaphoresis and fever.  Respiratory: Negative for cough.   Cardiovascular: Negative for chest pain.  Musculoskeletal: Positive for back pain.    No Known Allergies  OBJECTIVE: Vitals:   03/20/18 0540 03/20/18 1421 03/20/18 2213 03/21/18 0356  BP: 140/75 118/70 123/66 130/75  Pulse: (!) 57 (!) 56 (!) 50 (!) 59  Resp: 18 18 18 20   Temp: (!) 97.5 F (36.4 C) 97.6 F (36.4 C) 98 F (36.7 C) 97.7 F (36.5 C)  TempSrc: Oral Oral Oral Oral  SpO2: 97% 99% 98% 99%  Weight:      Height:       Body mass index is 20.35 kg/m.  Physical Exam    Constitutional:  He is thin and frail.  HENT:  Poor dentition.  Pulmonary/Chest: Effort normal. He has no wheezes. He has rales.    Lab Results Lab Results  Component Value Date   WBC 7.1 03/21/2018   HGB 12.7 (L) 03/21/2018   HCT 42.2 03/21/2018   MCV 87.4 03/21/2018   PLT 447 (H) 03/21/2018    Lab Results  Component Value Date   CREATININE 0.77 03/21/2018   BUN <5 (L) 03/21/2018   NA 135 03/21/2018   K 3.6 03/21/2018   CL 102 03/21/2018   CO2 28 03/21/2018    Lab Results  Component Value Date   ALT 7 03/16/2018   AST 13 (L) 03/16/2018   ALKPHOS 50 03/16/2018   BILITOT 0.5 03/16/2018     Microbiology: Recent Results (from the past 240 hour(s))  Culture, blood (routine x 2) Call MD if unable to obtain prior to antibiotics being given     Status: None   Collection Time: 03/14/18  3:44 PM  Result Value Ref Range Status   Specimen Description BLOOD RIGHT ANTECUBITAL  Final   Special Requests   Final    BOTTLES DRAWN AEROBIC AND ANAEROBIC Blood Culture adequate volume   Culture   Final    NO GROWTH  5 DAYS Performed at [facility], [place] [address] [place], [place] [address]    Report Status 03/19/2018 FINAL  Final  Culture, blood (routine x 2) Call MD if unable to obtain prior to antibiotics being given     Status: None   Collection Time: 03/14/18  3:55 PM  Result Value Ref Range Status   Specimen Description BLOOD LEFT ANTECUBITAL  Final   Special Requests   Final    BOTTLES DRAWN AEROBIC AND ANAEROBIC Blood Culture adequate volume   Culture   Final    NO GROWTH 5 DAYS Performed at [facility], [place] [address] [place], [place] [address]    Report Status 03/19/2018 FINAL  Final  Acid Fast Smear (AFB)     Status: None   Collection Time: 03/14/18  4:18 PM  Result Value Ref Range Status   AFB Specimen Processing Concentration  Final   Acid Fast Smear Negative  Final    Comment: (NOTE) Performed At: [facility] [place], [place] [id number] Rush Farmer MD [id number]    Source (AFB) PLEURAL  Final    Comment: Performed at [facility], [place] [address] [place], [place] [address]  Fungus Culture With Stain     Status: None (Preliminary result)   Collection Time: 03/14/18  4:18 PM  Result Value Ref Range Status   Fungus Stain Final report  Final    Comment: (NOTE) Performed At: [facility] [place], [place] [id number] Rush Farmer MD [id number]    Fungus (Mycology) Culture PENDING  Incomplete   Fungal Source PLEURAL  Final    Comment: Performed at [facility], [place] [address], [place], [place] [address]  Fungus Culture Result     Status: None   Collection Time: 03/14/18  4:18 PM  Result Value Ref Range Status   Result 1 Comment  Final    Comment: (NOTE) KOH/Calcofluor preparation:  no fungus observed. Performed At: [facility] [place], [place] [id number] Rush Farmer MD [id number]   Aerobic/Anaerobic Culture (surgical/deep wound)     Status: None   Collection Time: 03/14/18  5:41 PM  Result Value Ref Range Status   Specimen Description ABSCESS  Final   Special Requests Normal  Final   Gram Stain   Final    ABUNDANT WBC PRESENT, PREDOMINANTLY PMN ABUNDANT GRAM NEGATIVE RODS    Culture   Final    MODERATE PREVOTELLA SPECIES BETA LACTAMASE POSITIVE Performed at [facility], [address] [place], [place] [address]    Report Status 03/19/2018 FINAL  Final  Culture, sputum-assessment     Status: None   Collection Time: 03/14/18 10:44 PM  Result Value Ref Range Status   Specimen Description EXPECTORATED SPUTUM  Final   Special Requests Normal  Final   Sputum evaluation   Final    THIS SPECIMEN IS ACCEPTABLE FOR SPUTUM CULTURE Performed at [facility], [place] [address], [place],  [address]    Report Status 03/16/2018 FINAL  Final  Culture, respiratory     Status: None   Collection Time:  03/14/18 10:44 PM  Result Value Ref Range Status   Specimen Description EXPECTORATED SPUTUM  Final   Special Requests Normal Reflexed from [id number]  Final   Gram Stain   Final    ABUNDANT WBC PRESENT,BOTH PMN AND MONONUCLEAR FEW GRAM POSITIVE COCCI FEW GRAM VARIABLE ROD FEW SQUAMOUS EPITHELIAL CELLS PRESENT  Culture   Final    Consistent with normal respiratory flora. Performed at [facility], [place] [address], [place], [place] [address]    Report Status 03/18/2018 FINAL  Final  Acid Fast Smear (AFB)     Status: None   Collection Time: 03/17/18 12:57 AM  Result Value Ref Range Status   AFB Specimen Processing Concentration  Final   Acid Fast Smear Negative  Final    Comment: (NOTE) Performed At: [facility] [place], [place] [id number] Rush Farmer MD [id number]    Source (AFB) EXPECTORATED SPUTUM  Final    Comment: Performed at [facility], [place] [address], [place], [place] [address]  Expectorated sputum assessment w rflx to resp cult     Status: None   Collection Time: 03/17/18 12:58 AM  Result Value Ref Range Status   Specimen Description EXPECTORATED SPUTUM  Final   Special Requests NONE  Final   Sputum evaluation   Final    THIS SPECIMEN IS ACCEPTABLE FOR SPUTUM CULTURE Performed at [facility], [place] [address], [place], [place] [address]    Report Status 03/17/2018 FINAL  Final  Culture, respiratory     Status: None   Collection Time: 03/17/18 12:58 AM  Result Value Ref Range Status   Specimen Description EXPECTORATED SPUTUM  Final   Special Requests NONE Reflexed from [id number]  Final   Gram Stain   Final    ABUNDANT WBC PRESENT, PREDOMINANTLY PMN RARE GRAM VARIABLE ROD    Culture   Final    FEW Consistent with normal respiratory flora. Performed at [facility], [place] [address], [place], [place] [address]    Report Status 03/19/2018 FINAL  Final  Expectorated sputum assessment w rflx to resp cult     Status: None    Collection Time: 03/17/18 10:30 AM  Result Value Ref Range Status   Specimen Description SPUTUM  Final   Special Requests NONE  Final   Sputum evaluation   Final    Sputum specimen not acceptable for testing.  Please recollect.   RESULT CALLED TO, READ BACK BY AND VERIFIED WITH: PERRY RN AT 1143 ON [id number] BY SJW Performed at [facility], [place] [address] [place], [place] [address]    Report Status 03/18/2018 FINAL  Final  Acid Fast Smear (AFB)     Status: None   Collection Time: 03/18/18 10:15 AM  Result Value Ref Range Status   AFB Specimen Processing Concentration  Final   Acid Fast Smear Negative  Final    Comment: (NOTE) Performed At: [facility] [place], [place] [id number] Rush Farmer MD [id number]    Source (AFB) SPUTUM  Final    Comment: Performed at [facility], [place] [address], [place], [place] [address]    Michel Bickers, [facility] [phone] pager   [phone] cell 03/21/2018, 12:19 PM

## 2018-03-21 NOTE — Progress Notes (Signed)
PROGRESS NOTE    Derrick Beltran  VCB:[id number] DOB: Aug 20, 1960 DOA: 03/14/2018 PCP: [name], PA-C   Brief Narrative:  57 year old with past medical history of tobacco use, medical noncompliance came to the hospital about 2 weeks ago with complains of progressive weakness fatigue and hemoptysis.  At that time CT of the chest showed abnormal right upper lobe with right-sided pleural effusion.  There was plans for thoracentesis but patient ended up leaving [place].  He returned to the ER again on 10/21 with similar complaints and agreed to stay for the admission.  IR was consulted and CT-guided right-sided [place] drain was placed on 10/21.  Cardiothoracic surgery, pulmonology and infectious disease were consulted on 10/22.  During the stay patient continues to have intermittent agitation requiring Ativan.  His AFB smear was negative therefore airborne precaution were discontinued.  Zosyn was changed to Unasyn on 03/20/2018.   Assessment & Plan:   Principal Problem:   Empyema ([place]) Active Problems:   Thrombocytosis (HCC)   Hyponatremia   Tobacco use   Malnutrition of moderate degree  Right-sided pleural empyema with hydropneumothorax, stable Right upper lobe bronchiectasis - Awaiting further instructions per cardio thoracic team -Chest tube is in place since 03/14/2018 draining purulent/serosanguineous fluid.  TPA performed by cardiothoracic surgery.  Hoping to get some results this way and to avoid VATS procedure as patient is also reluctant to do this.  Further management per cardiothoracic surgery team. -Chest x-ray from 10/24- stable position of pleural catheter, slightly decreased right basilar pneumothorax, stable right lung findings.  Repeat chest x-ray 10/25 shows stable right lung diffuse opacity. - Cx= Prevotella (oral pathogen),  -HiV is negative - AFB smear-negative.  Discontinue airborne precaution per infectious disease. -Fungitell- negative -Aspergillus  antibody- negative -Quantiferron TB - intermediate -Infectious disease consulted-appreciate input, repeat sputum cultures ordered -Zosyn stopped and switched to Unasyn 10/27 - No plans for bronchoscopy at this point, malignancy eval can be performed outpatient on nonemergent basis once this acute illnesses over. - Urine strep is negative -Chest tube management per cardiothoracic team.  Agitation and delirium with history of alcohol use - Continue alcohol withdrawal protocol  Tobacco use - Counseled to quit using tobacco.  Severe protein calorie malnutrition -Encourage oral intake  DVT prophylaxis: SCDs Code Status: Full code Family Communication: None at bedside Disposition Plan: Maintain inpatient stay until cleared by cardiothoracic surgery and further treatment plan developed for his empyema.  Patient is at very high risk of leaving [place].  Consultants:  Interventional radiology Infectious disease Cardiothoracic surgery Pulmonary critical care medicine  Procedures:   CT-guided French catheter placement in the chest 10/21  Antimicrobials:   IV Zosyn   Subjective: Sitting up in the bed and continues to request when can he go home. Remains afebrile, no acute events overnight.  Review of Systems Otherwise negative except as per HPI, including: General = no fevers, chills, dizziness, malaise, fatigue HEENT/EYES = negative for pain, redness, loss of vision, double vision, blurred vision, loss of hearing, sore throat, hoarseness, dysphagia Cardiovascular= negative for chest pain, palpitation, murmurs, lower extremity swelling Respiratory/lungs= negative for shortness of breath, cough, hemoptysis, wheezing, mucus production Gastrointestinal= negative for nausea, vomiting,, abdominal pain, melena, hematemesis Genitourinary= negative for Dysuria, Hematuria, Change in Urinary Frequency MSK = Negative for arthralgia, myalgias, Back Pain, Joint swelling    Neurology= Negative for headache, seizures, numbness, tingling  Psychiatry= Negative for anxiety, depression, suicidal and homocidal ideation Allergy/Immunology= Medication/Food allergy as listed  Skin= Negative for  Rash, lesions, ulcers, itching  Objective: Vitals:   03/20/18 0540 03/20/18 1421 03/20/18 2213 03/21/18 0356  BP: 140/75 118/70 123/66 130/75  Pulse: (!) 57 (!) 56 (!) 50 (!) 59  Resp: 18 18 18 20   Temp: (!) 97.5 F (36.4 C) 97.6 F (36.4 C) 98 F (36.7 C) 97.7 F (36.5 C)  TempSrc: Oral Oral Oral Oral  SpO2: 97% 99% 98% 99%  Weight:      Height:        Intake/Output Summary (Last 24 hours) at 03/21/2018 1133 Last data filed at 03/21/2018 0359 Gross per 24 hour  Intake 720 ml  Output 370 ml  Net 350 ml   Filed Weights   03/16/18 1603  Weight: 62.5 kg    Examination:  Constitutional: NAD, calm, comfortable Eyes: PERRL, lids and conjunctivae normal ENMT: Mucous membranes are moist. Posterior pharynx clear of any exudate or lesions.Normal dentition.  Neck: normal, supple, no masses, no thyromegaly Respiratory: clear to auscultation bilaterally, no wheezing, no crackles. Normal respiratory effort. No accessory muscle use.  Cardiovascular: Regular rate and rhythm, no murmurs / rubs / gallops. No extremity edema. 2+ pedal pulses. No carotid bruits.  Abdomen: no tenderness, no masses palpated. No hepatosplenomegaly. Bowel sounds positive.  Musculoskeletal: no clubbing / cyanosis. No joint deformity upper and lower extremities. Good ROM, no contractures. Normal muscle tone.  Skin: no rashes, lesions, ulcers. No induration Neurologic: CN 2-12 grossly intact. Sensation intact, DTR normal. Strength 5/5 in all 4.  Psychiatric: Normal judgment and insight. Alert and oriented x 3. Normal mood.  Chest tube is in place.  Data Reviewed:   CBC: Recent Labs  Lab 03/15/18 0500 03/16/18 0148 03/18/18 0551 03/19/18 0336 03/20/18 0324 03/21/18 0324  WBC 9.5 8.1 6.1  7.7 6.6 7.1  NEUTROABS 7.1  --   --   --   --   --   HGB 12.4* 12.1* 11.9* 13.3 12.8* 12.7*  HCT 39.4 39.7 39.7 44.3 41.7 42.2  MCV 88.1 88.6 88.4 89.0 87.8 87.4  PLT 460* 497* 471* 469* 455* 470*   Basic Metabolic Panel: Recent Labs  Lab 03/16/18 0148 03/18/18 0551 03/19/18 0336 03/20/18 0324 03/21/18 0324  NA 130* 134* 134* 133* 135  K 3.4* 3.2* 3.7 3.5 3.6  CL 96* 98 97* 99 102  CO2 27 28 30 29 28   GLUCOSE 108* 103* 97 97 83  BUN 8 <5* <5* <5* <5*  CREATININE 0.77 0.68 0.79 0.92 0.77  CALCIUM 8.0* 8.4* 8.4* 8.2* 7.9*  MG  --  1.8 1.8 1.9 1.7   GFR: Estimated Creatinine Clearance: 90.1 mL/min (by C-G formula based on SCr of 0.77 mg/dL). Liver Function Tests: Recent Labs  Lab 03/16/18 0148  AST 13*  ALT 7  ALKPHOS 50  BILITOT 0.5  PROT 7.3  ALBUMIN 1.5*   No results for input(s): LIPASE, AMYLASE in the last 168 hours. No results for input(s): AMMONIA in the last 168 hours. Coagulation Profile: Recent Labs  Lab 03/14/18 1339  INR 1.31   Cardiac Enzymes: No results for input(s): CKTOTAL, CKMB, CKMBINDEX, TROPONINI in the last 168 hours. BNP (last 3 results) No results for input(s): PROBNP in the last 8760 hours. HbA1C: No results for input(s): HGBA1C in the last 72 hours. CBG: No results for input(s): GLUCAP in the last 168 hours. Lipid Profile: No results for input(s): CHOL, HDL, LDLCALC, TRIG, CHOLHDL, LDLDIRECT in the last 72 hours. Thyroid Function Tests: No results for input(s): TSH, T4TOTAL, FREET4, T3FREE, THYROIDAB in  the last 72 hours. Anemia Panel: No results for input(s): VITAMINB12, FOLATE, FERRITIN, TIBC, IRON, RETICCTPCT in the last 72 hours. Sepsis Labs: No results for input(s): PROCALCITON, LATICACIDVEN in the last 168 hours.  Recent Results (from the past 240 hour(s))  Culture, blood (routine x 2) Call MD if unable to obtain prior to antibiotics being given     Status: None   Collection Time: 03/14/18  3:44 PM  Result Value Ref Range  Status   Specimen Description BLOOD RIGHT ANTECUBITAL  Final   Special Requests   Final    BOTTLES DRAWN AEROBIC AND ANAEROBIC Blood Culture adequate volume   Culture   Final    NO GROWTH 5 DAYS Performed at [facility], [address], [place], [place] [address]    Report Status 03/19/2018 FINAL  Final  Culture, blood (routine x 2) Call MD if unable to obtain prior to antibiotics being given     Status: None   Collection Time: 03/14/18  3:55 PM  Result Value Ref Range Status   Specimen Description BLOOD LEFT ANTECUBITAL  Final   Special Requests   Final    BOTTLES DRAWN AEROBIC AND ANAEROBIC Blood Culture adequate volume   Culture   Final    NO GROWTH 5 DAYS Performed at [facility], [address], [place], [place] [address]    Report Status 03/19/2018 FINAL  Final  Acid Fast Smear (AFB)     Status: None   Collection Time: 03/14/18  4:18 PM  Result Value Ref Range Status   AFB Specimen Processing Concentration  Final   Acid Fast Smear Negative  Final    Comment: (NOTE) Performed At: [facility] [place], [place] [id number] Rush Farmer MD [id number]    Source (AFB) PLEURAL  Final    Comment: Performed at [facility], [place] [address], [place], [place] [address]  Fungus Culture With Stain     Status: None (Preliminary result)   Collection Time: 03/14/18  4:18 PM  Result Value Ref Range Status   Fungus Stain Final report  Final    Comment: (NOTE) Performed At: [facility] [place], [place] [id number] Rush Farmer MD FG:[id number]    Fungus (Mycology) Culture PENDING  Incomplete   Fungal Source PLEURAL  Final    Comment: Performed at [facility], [place] [address], [place], [place] [address]  Fungus Culture Result     Status: None   Collection Time: 03/14/18  4:18 PM  Result Value Ref Range Status   Result 1 Comment  Final    Comment: (NOTE) KOH/Calcofluor preparation:  no fungus  observed. Performed At: [facility] [place], [place] [id number] Rush Farmer MD [id number]   Aerobic/Anaerobic Culture (surgical/deep wound)     Status: None   Collection Time: 03/14/18  5:41 PM  Result Value Ref Range Status   Specimen Description ABSCESS  Final   Special Requests Normal  Final   Gram Stain   Final    ABUNDANT WBC PRESENT, PREDOMINANTLY PMN ABUNDANT GRAM NEGATIVE RODS    Culture   Final    MODERATE PREVOTELLA SPECIES BETA LACTAMASE POSITIVE Performed at [facility], [address], [place], [place] [address]    Report Status 03/19/2018 FINAL  Final  Culture, sputum-assessment     Status: None   Collection Time: 03/14/18 10:44 PM  Result Value Ref Range Status   Specimen Description EXPECTORATED SPUTUM  Final   Special Requests Normal  Final   Sputum evaluation   Final    THIS SPECIMEN IS ACCEPTABLE FOR SPUTUM CULTURE Performed at [facility], [place] [address] [place], [place] [address]    Report Status 03/16/2018 FINAL  Final  Culture, respiratory     Status: None   Collection Time: 03/14/18 10:44 PM  Result Value Ref Range Status   Specimen Description EXPECTORATED SPUTUM  Final   Special Requests Normal Reflexed from [id number]  Final   Gram Stain   Final    ABUNDANT WBC PRESENT,BOTH PMN AND MONONUCLEAR FEW GRAM POSITIVE COCCI FEW GRAM VARIABLE ROD FEW SQUAMOUS EPITHELIAL CELLS PRESENT    Culture   Final    Consistent with normal respiratory flora. Performed at [facility], [place] [address] [place], [place] [address]    Report Status 03/18/2018 FINAL  Final  Acid Fast Smear (AFB)     Status: None   Collection Time: 03/17/18 12:57 AM  Result Value Ref Range Status   AFB Specimen Processing Concentration  Final   Acid Fast Smear Negative  Final    Comment: (NOTE) Performed At: [facility] [place], [place] [id number] Rush Farmer MD [id number]    Source (AFB) EXPECTORATED  SPUTUM  Final    Comment: Performed at [facility], [place] [address], [place], [place] [address]  Expectorated sputum assessment w rflx to resp cult     Status: None   Collection Time: 03/17/18 12:58 AM  Result Value Ref Range Status   Specimen Description EXPECTORATED SPUTUM  Final   Special Requests NONE  Final   Sputum evaluation   Final    THIS SPECIMEN IS ACCEPTABLE FOR SPUTUM CULTURE Performed at [facility], [place] [address], [place], [place] [address]    Report Status 03/17/2018 FINAL  Final  Culture, respiratory     Status: None   Collection Time: 03/17/18 12:58 AM  Result Value Ref Range Status   Specimen Description EXPECTORATED SPUTUM  Final   Special Requests NONE Reflexed from [id number]  Final   Gram Stain   Final    ABUNDANT WBC PRESENT, PREDOMINANTLY PMN RARE GRAM VARIABLE ROD    Culture   Final    FEW Consistent with normal respiratory flora. Performed at [facility], [place] [address], [place], [place] [address]    Report Status 03/19/2018 FINAL  Final  Expectorated sputum assessment w rflx to resp cult     Status: None   Collection Time: 03/17/18 10:30 AM  Result Value Ref Range Status   Specimen Description SPUTUM  Final   Special Requests NONE  Final   Sputum evaluation   Final    Sputum specimen not acceptable for testing.  Please recollect.   RESULT CALLED TO, READ BACK BY AND VERIFIED WITH: PERRY RN AT 1143 ON [id number] BY SJW Performed at [facility], [place] [address] [place], [place] [address]    Report Status 03/18/2018 FINAL  Final  Acid Fast Smear (AFB)     Status: None   Collection Time: 03/18/18 10:15 AM  Result Value Ref Range Status   AFB Specimen Processing Concentration  Final   Acid Fast Smear Negative  Final    Comment: (NOTE) Performed At: [facility] [place], [place] [id number] Rush Farmer MD [id number]    Source (AFB) SPUTUM  Final    Comment: Performed at [facility], [address] [place], [place] [address]         Radiology Studies: Dg Chest [place] 1 View  Result Date: 03/21/2018 CLINICAL DATA:  57 year old male with a history of empyema EXAM: PORTABLE CHEST 1 VIEW COMPARISON:  Multiple prior most recent 03/20/2018 FINDINGS: Cardiomediastinal silhouette unchanged with the right heart border and mediastinal border obscured by overlying lung/pleural disease. Left lung remains well aerated. Unchanged position of pigtail drainage catheter terminating at the right lung base at the cardiophrenic angle. Similar pattern of mixed airspace and interstitial opacity with intervening residual lucency of the right lung. Pleuroparenchymal thickening is unchanged. Lucency at the right lung base less conspicuous than the comparison. IMPRESSION: Unchanged right pigtail thoracostomy tube. Decreasing conspicuity of the lucency at the right lung base, however, a residual hydropneumothorax is difficult to exclude. Similar appearance of mixed interstitial and airspace opacity of the right lung, with residual pleural fluid/thickening. Electronically Signed   By: Corrie Mckusick D.O.   On: 03/21/2018 08:44   Dg Chest Port 1 View  Result Date: 03/20/2018 CLINICAL DATA:  Followup right chest tube and mild right basilar pneumothorax. EXAM: PORTABLE CHEST 1 VIEW COMPARISON:  03/18/2018. FINDINGS: A small caliber pigtail chest tube remains at the right lung base. Approximately 10% right basilar pneumothorax without significant overall change in size. No significant change in right pleural fluid and right lung airspace opacity and linear density. The left lung remains clear with stable mildly prominent interstitial markings. Thoracic spine degenerative changes. Normal sized heart. IMPRESSION: 1. Stable 10% right basilar pneumothorax. 2. Stable right pleural fluid and right lung atelectasis/pneumonia. 3. Stable mild chronic interstitial lung disease. Electronically Signed   By: Claudie Revering M.D.   On:  03/20/2018 09:36        Scheduled Meds: . folic acid  1 mg Oral Daily  . hydrocortisone   Rectal BID  . lidocaine  1 patch Transdermal Q24H  . multivitamin with minerals  1 tablet Oral Daily  . nicotine  21 mg Transdermal Daily  . nystatin  5 mL Oral QID  . thiamine  100 mg Oral Daily   Or  . thiamine  100 mg Intravenous Daily   Continuous Infusions: . ampicillin-sulbactam (UNASYN) IV 3 g (03/21/18 0813)     LOS: 7 days   Time spent= 22 mins    Ankit Arsenio Loader, MD [facility] Pager [phone]   If 7PM-7AM, please contact night-coverage [email or web address] Password [facility] 03/21/2018, 11:33 AM

## 2018-03-21 NOTE — Progress Notes (Signed)
  Subjective: Some discomfort from tube  Objective: Vital signs in last 24 hours: Temp:  [97.7 F (36.5 C)-98.2 F (36.8 C)] 98.2 F (36.8 C) (10/28 1450) Pulse Rate:  [50-59] 53 (10/28 1450) Resp:  [18-20] 18 (10/28 1450) BP: (123-133)/(66-77) 133/77 (10/28 1450) SpO2:  [98 %-99 %] 99 % (10/28 1450)  Hemodynamic parameters for last 24 hours:    Intake/Output from previous day: 10/27 0701 - 10/28 0700 In: 920 [P.O.:620; IV Piggyback:300] Out: 370 [Urine:240; Chest Tube:130] Intake/Output this shift: Total I/O In: 240 [P.O.:240] Out: -   General appearance: alert and cooperative Neurologic: intact Heart: regular rate and rhythm Lungs: diminished breath sounds right base serous fluid from chest tube  Lab Results: Recent Labs    03/20/18 0324 03/21/18 0324  WBC 6.6 7.1  HGB 12.8* 12.7*  HCT 41.7 42.2  PLT 455* 447*   BMET:  Recent Labs    03/20/18 0324 03/21/18 0324  NA 133* 135  K 3.5 3.6  CL 99 102  CO2 29 28  GLUCOSE 97 83  BUN <5* <5*  CREATININE 0.92 0.77  CALCIUM 8.2* 7.9*    PT/INR: No results for input(s): LABPROT, INR in the last 72 hours. ABG No results found for: PHART, HCO3, TCO2, ACIDBASEDEF, O2SAT CBG (last 3)  No results for input(s): GLUCAP in the last 72 hours.  Assessment/Plan: S/P  -Empyema. Cultures grew Prevatolla. Antibiotics per Dr. Megan Salon The CXR today showed essentially no space in right chest Fluid is serous Will place CT to water seal and may be able to remove tomorrow   LOS: 7 days    Melrose Nakayama 03/21/2018

## 2018-03-21 NOTE — Telephone Encounter (Signed)
Copied from [place] [phone]. Topic: Quick Communication - See Telephone Encounter >> Mar 21, 2018  9:28 AM Blase Mess A wrote: CRM for notification. See Telephone encounter for: 03/21/18.  Patient is in the hospital since 03/14/18. Patients wife is going to drop off Short term disablity paperwork to be completed.  Advised her that it could take 5-7 business days.

## 2018-03-22 ENCOUNTER — Inpatient Hospital Stay (HOSPITAL_COMMUNITY): Payer: No Typology Code available for payment source

## 2018-03-22 ENCOUNTER — Telehealth: Payer: Self-pay | Admitting: Physician Assistant

## 2018-03-22 DIAGNOSIS — K08439 Partial loss of teeth due to caries, unspecified class: Secondary | ICD-10-CM

## 2018-03-22 LAB — BASIC METABOLIC PANEL
Anion gap: 6 (ref 5–15)
BUN: 6 mg/dL (ref 6–20)
CHLORIDE: 101 mmol/L (ref 98–111)
CO2: 31 mmol/L (ref 22–32)
Calcium: 8.3 mg/dL — ABNORMAL LOW (ref 8.9–10.3)
Creatinine, Ser: 0.84 mg/dL (ref 0.61–1.24)
GFR calc Af Amer: >60 mL/min (ref >60)
GFR calc non Af Amer: >60 mL/min (ref >60)
GLUCOSE: 87 mg/dL (ref 70–99)
POTASSIUM: 3.8 mmol/L (ref 3.5–5.1)
Sodium: 138 mmol/L (ref 135–145)

## 2018-03-22 LAB — CBC
HEMATOCRIT: 41.3 % (ref 39.0–52.0)
HEMOGLOBIN: 12.8 g/dL — AB (ref 13.0–17.0)
MCH: 27.5 pg (ref 26.0–34.0)
MCHC: 31 g/dL (ref 30.0–36.0)
MCV: 88.6 fL (ref 80.0–100.0)
Platelets: 389 10*3/uL (ref 150–400)
RBC: 4.66 MIL/uL (ref 4.22–5.81)
RDW: 14.2 % (ref 11.5–15.5)
WBC: 6.5 10*3/uL (ref 4.0–10.5)
nRBC: 0 % (ref 0.0–0.2)

## 2018-03-22 LAB — MAGNESIUM: Magnesium: 1.7 mg/dL (ref 1.7–2.4)

## 2018-03-22 MED ORDER — AMOXICILLIN-POT CLAVULANATE 875-125 MG PO TABS: 1.0000 | ORAL_TABLET | Freq: Two times a day (BID) | ORAL | 0 refills | Status: AC

## 2018-03-22 NOTE — Telephone Encounter (Signed)
Pt's wife dropped off FMLA forms, I have placed them in the bin upfront with a charge sheet. Pt's wife states that pt is still in the hospital. Please call when forms are ready at [phone]

## 2018-03-22 NOTE — Progress Notes (Signed)
[facility]  Date of Admission:  03/14/2018   Total days of antibiotics 8        Day ampicillin-sulbactam         Patient ID: Derrick Beltran is a 57 y.o. M with Prevotella empyema and  Principal Problem:   Empyema ([place]) Active Problems:   Thrombocytosis (HCC)   Hyponatremia   Tobacco use   Malnutrition of moderate degree   . folic acid  1 mg Oral Daily  . hydrocortisone   Rectal BID  . lidocaine  1 patch Transdermal Q24H  . multivitamin with minerals  1 tablet Oral Daily  . nicotine  21 mg Transdermal Daily  . nystatin  5 mL Oral QID  . thiamine  100 mg Oral Daily   Or  . thiamine  100 mg Intravenous Daily    SUBJECTIVE: Doing well today. Getting his CT out today and he is happy about that. He is wondering how long he needs to take off of work. He has continued to have no trouble with breathing.   No Known Allergies  OBJECTIVE: Vitals:   03/21/18 0356 03/21/18 1450 03/21/18 2205 03/22/18 0830  BP: 130/75 133/77 (!) 142/69 117/66  Pulse: (!) 59 (!) 53 (!) 51 (!) 54  Resp: 20 18 18    Temp: 97.7 F (36.5 C) 98.2 F (36.8 C) 98.4 F (36.9 C) 97.8 F (36.6 C)  TempSrc: Oral Oral Oral Oral  SpO2: 99% 99% 99% 100%  Weight:      Height:       Body mass index is 20.35 kg/m.  Physical Exam  Constitutional: He is oriented to person, place, and time. He appears well-developed.  Non-toxic appearance.  Thin, chronically ill appearing, older than stated age. Resting comfortably in bed.   HENT:  Mouth/Throat: No oral lesions. Normal dentition. No dental caries.  Poor dentition. Multiple missing teeth and carries.   Eyes: No scleral icterus.  Cardiovascular: Normal rate, regular rhythm and normal heart sounds.  Pulmonary/Chest: Effort normal and breath sounds normal. No respiratory distress.  R CT in place. No air leak. Minimal drainage today.   Abdominal: Soft. He exhibits no distension. There is no tenderness.  Lymphadenopathy:   He has no cervical adenopathy.  Neurological: He is alert and oriented to person, place, and time.  Skin: Skin is warm and dry. No rash noted.    Lab Results Lab Results  Component Value Date   WBC 6.5 03/22/2018   HGB 12.8 (L) 03/22/2018   HCT 41.3 03/22/2018   MCV 88.6 03/22/2018   PLT 389 03/22/2018    Lab Results  Component Value Date   CREATININE 0.84 03/22/2018   BUN 6 03/22/2018   NA 138 03/22/2018   K 3.8 03/22/2018   CL 101 03/22/2018   CO2 31 03/22/2018    Lab Results  Component Value Date   ALT 7 03/16/2018   AST 13 (L) 03/16/2018   ALKPHOS 50 03/16/2018   BILITOT 0.5 03/16/2018     Microbiology: Recent Results (from the past 240 hour(s))  Culture, blood (routine x 2) Call MD if unable to obtain prior to antibiotics being given     Status: None   Collection Time: 03/14/18  3:44 PM  Result Value Ref Range Status   Specimen Description BLOOD RIGHT ANTECUBITAL  Final   Special Requests   Final    BOTTLES DRAWN AEROBIC AND ANAEROBIC Blood Culture adequate volume  Culture   Final    NO GROWTH 5 DAYS Performed at [facility], [place] [address] [place], [place] [address]    Report Status 03/19/2018 FINAL  Final  Culture, blood (routine x 2) Call MD if unable to obtain prior to antibiotics being given     Status: None   Collection Time: 03/14/18  3:55 PM  Result Value Ref Range Status   Specimen Description BLOOD LEFT ANTECUBITAL  Final   Special Requests   Final    BOTTLES DRAWN AEROBIC AND ANAEROBIC Blood Culture adequate volume   Culture   Final    NO GROWTH 5 DAYS Performed at [facility], [place] [address] [place], [place] [address]    Report Status 03/19/2018 FINAL  Final  Acid Fast Smear (AFB)     Status: None   Collection Time: 03/14/18  4:18 PM  Result Value Ref Range Status   AFB Specimen Processing Concentration  Final   Acid Fast Smear Negative  Final    Comment: (NOTE) Performed At: [facility] [place], [place] [id number] Rush Farmer MD VV:[id number]    Source (AFB) PLEURAL  Final    Comment: Performed at [facility], [place] [address] [place], [place] [address]  Fungus Culture With Stain     Status: None (Preliminary result)   Collection Time: 03/14/18  4:18 PM  Result Value Ref Range Status   Fungus Stain Final report  Final    Comment: (NOTE) Performed At: [facility] [place], [place] [id number] Rush Farmer MD KX:[id number]    Fungus (Mycology) Culture PENDING  Incomplete   Fungal Source PLEURAL  Final    Comment: Performed at [facility], [place] [address], [place], [place] [address]  Fungus Culture Result     Status: None   Collection Time: 03/14/18  4:18 PM  Result Value Ref Range Status   Result 1 Comment  Final    Comment: (NOTE) KOH/Calcofluor preparation:  no fungus observed. Performed At: [facility] [place], [place] [id number] Rush Farmer MD [id number]   Aerobic/Anaerobic Culture (surgical/deep wound)     Status: None   Collection Time: 03/14/18  5:41 PM  Result Value Ref Range Status   Specimen Description ABSCESS  Final   Special Requests Normal  Final   Gram Stain   Final    ABUNDANT WBC PRESENT, PREDOMINANTLY PMN ABUNDANT GRAM NEGATIVE RODS    Culture   Final    MODERATE PREVOTELLA SPECIES BETA LACTAMASE POSITIVE Performed at [facility], [address], [place], [place] [address]    Report Status 03/19/2018 FINAL  Final  Culture, sputum-assessment     Status: None   Collection Time: 03/14/18 10:44 PM  Result Value Ref Range Status   Specimen Description EXPECTORATED SPUTUM  Final   Special Requests Normal  Final   Sputum evaluation   Final    THIS SPECIMEN IS ACCEPTABLE FOR SPUTUM CULTURE Performed at [facility], [place] [address], [place], [place] [address]    Report Status 03/16/2018 FINAL  Final  Culture, respiratory     Status: None   Collection Time:  03/14/18 10:44 PM  Result Value Ref Range Status   Specimen Description EXPECTORATED SPUTUM  Final   Special Requests Normal Reflexed from [id number]  Final   Gram Stain   Final    ABUNDANT WBC PRESENT,BOTH PMN AND MONONUCLEAR FEW GRAM POSITIVE COCCI FEW  GRAM VARIABLE ROD FEW SQUAMOUS EPITHELIAL CELLS PRESENT    Culture   Final    Consistent with normal respiratory flora. Performed at [facility], [place] [address], [place], [place] [address]    Report Status 03/18/2018 FINAL  Final  Acid Fast Smear (AFB)     Status: None   Collection Time: 03/17/18 12:57 AM  Result Value Ref Range Status   AFB Specimen Processing Concentration  Final   Acid Fast Smear Negative  Final    Comment: (NOTE) Performed At: [facility] [place], [place] [id number] Rush Farmer MD NU:[id number]    Source (AFB) EXPECTORATED SPUTUM  Final    Comment: Performed at [facility], [place] [address], [place], [place] [address]  Expectorated sputum assessment w rflx to resp cult     Status: None   Collection Time: 03/17/18 12:58 AM  Result Value Ref Range Status   Specimen Description EXPECTORATED SPUTUM  Final   Special Requests NONE  Final   Sputum evaluation   Final    THIS SPECIMEN IS ACCEPTABLE FOR SPUTUM CULTURE Performed at [facility], [place] [address], [place], [place] [address]    Report Status 03/17/2018 FINAL  Final  Culture, respiratory     Status: None   Collection Time: 03/17/18 12:58 AM  Result Value Ref Range Status   Specimen Description EXPECTORATED SPUTUM  Final   Special Requests NONE Reflexed from [id number]  Final   Gram Stain   Final    ABUNDANT WBC PRESENT, PREDOMINANTLY PMN RARE GRAM VARIABLE ROD    Culture   Final    FEW Consistent with normal respiratory flora. Performed at [facility], [place] [address], [place], [place] [address]    Report Status 03/19/2018 FINAL  Final  Expectorated sputum assessment w rflx to resp cult     Status: None    Collection Time: 03/17/18 10:30 AM  Result Value Ref Range Status   Specimen Description SPUTUM  Final   Special Requests NONE  Final   Sputum evaluation   Final    Sputum specimen not acceptable for testing.  Please recollect.   RESULT CALLED TO, READ BACK BY AND VERIFIED WITH: PERRY RN AT 1143 ON [id number] BY SJW Performed at [facility], [place] [address] [place], [place] [address]    Report Status 03/18/2018 FINAL  Final  Acid Fast Smear (AFB)     Status: None   Collection Time: 03/18/18 10:15 AM  Result Value Ref Range Status   AFB Specimen Processing Concentration  Final   Acid Fast Smear Negative  Final    Comment: (NOTE) Performed At: [facility] [place], [place] [id number] Rush Farmer MD TD:[id number]    Source (AFB) SPUTUM  Final    Comment: Performed at [facility], [place] [address] [place], [place] [address]   ASSESSMENT & PLAN:   1. 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 Still with considerable pleural fluid/thickening and small aerated R lung present. Will continue IV ampicillin-sulbactam while inpatient. He will need likely 14 - 21 days of therapy at discharge with oral amoxicilin-clavulanate. Follow up arranged outpatient with Dr. Roxan Hockey.   2. Medication monitoring = afebrile and normal wbc count  3. Poor dentition = needs follow up with dentist   4. Health Mainteance = HIV non-reactive/ will check Hep C Ab screen as he has not had testing  in our system or in [place].    Janene Madeira, MSN, NP-C [facility] for Infectious [place] Cell: [phone] Pager: [phone]  03/22/2018  10:58 AM

## 2018-03-22 NOTE — Progress Notes (Addendum)
      [address]       [place] [address]             [phone]           Subjective: Patient adamant about going home after chest tube is pulled.  Objective: Vital signs in last 24 hours: Temp:  [97.8 F (36.6 C)-98.4 F (36.9 C)] 97.8 F (36.6 C) (10/29 0830) Pulse Rate:  [51-54] 54 (10/29 0830) Resp:  [18] 18 (10/28 2205) BP: (117-142)/(66-77) 117/66 (10/29 0830) SpO2:  [99 %-100 %] 100 % (10/29 0830)      Intake/Output from previous day: 10/28 0701 - 10/29 0700 In: 360 [P.O.:360] Out: 325 [Urine:275; Chest Tube:50]   Physical Exam:  Cardiovascular: Slightly bradycardic Pulmonary: Clear to auscultation on left and diminished right base Right pigtail tube:serous drainage  Lab Results: CBC: Recent Labs    03/21/18 0324 03/22/18 0439  WBC 7.1 6.5  HGB 12.7* 12.8*  HCT 42.2 41.3  PLT 447* 389   BMET:  Recent Labs    03/21/18 0324 03/22/18 0439  NA 135 138  K 3.6 3.8  CL 102 101  CO2 28 31  GLUCOSE 83 87  BUN <5* 6  CREATININE 0.77 0.84  CALCIUM 7.9* 8.3*    PT/INR: No results for input(s): LABPROT, INR in the last 72 hours. ABG:  INR: Will add last result for INR, ABG once components are confirmed Will add last 4 CBG results once components are confirmed  Assessment/Plan:  1. CV - SB at times. 2.  Pulmonary - Right pigtail catheter with 50 cc of output last 24 hours. CXR appears shows stable appearance of the known empyema on the right and no pneumothorax. Per Dr. Roxan Hockey, chest tube to be removed today. Will check CXR in am. 3. Follow up appointment arranged;management per primary   Donielle M ZimmermanPA-C 03/22/2018,9:40 AM [phone]

## 2018-03-22 NOTE — Progress Notes (Signed)
Patient getting agitated stating he wants to go home. Md made aware , cardiothoracic had not cleared patient yet per MD. Chest Xray was done after CT removal . Explained top the patient that per MD , cardiothoracic needs to clear him before discharge and needed another CXray in AM, patient get agitated saying "I am not a slaved and I can do whatever I want to do, I will go home and no one can hold me here. I am not a prisoner". Spouse at bedside and try to convince patient to stay but patient continue to insist to go home. AMA formed signed.

## 2018-03-22 NOTE — Progress Notes (Addendum)
Referring Physician(s): Thomasenia Bottoms  Supervising Physician: Corrie Mckusick  Patient Status:  [facility] - In-pt  Chief Complaint:  Empyema  Subjective:  Patient lying bed. Doesn't really talk much. No complaints  Allergies: Patient has no known allergies.  Medications: Prior to Admission medications   Medication Sig Start Date End Date Taking? Authorizing Provider  hydrocortisone cream 1 % Place 1 application rectally as needed for itching.  02/21/18  Yes [provider]  naproxen sodium (ALEVE) 220 MG tablet Take 960 mg by mouth daily as needed (pain).    Yes [provider]     Vital Signs: BP 117/66 (BP Location: Right Arm)   Pulse (!) 54   Temp 97.8 F (36.6 C) (Oral)   Resp 18   Ht 5\' 9"  (1.753 m)   Wt 62.5 kg   SpO2 100%   BMI 20.35 kg/m   Physical Exam Awake, appears frustrated, won't really talk to me Right chest tube remains in place ~50 mL recorded output  Imaging: Dg Chest Port 1 View  Result Date: 03/22/2018 CLINICAL DATA:  Follow-up empyema with chest tube treatment. EXAM: PORTABLE CHEST 1 VIEW COMPARISON:  Portable chest x-ray of October 01/11/2018 FINDINGS: The left lung is mildly hyperinflated. There is stable patchy density at the left base. There is mild shift of the mediastinum toward the right. Only a small amount of aerated right lung is present. There is considerable pleural fluid and/or pleural thickening present consistent with known empyema. There is no pneumothorax. The small caliber right chest tube is stable in position at the right lung base. The pulmonary vascularity is not engorged. The cardiac silhouette is largely obscured. IMPRESSION: Stable appearance of the known empyema on the right. No pneumothorax. The small caliber chest tube is in stable position. Electronically Signed   By: David  Martinique M.D.   On: 03/22/2018 09:22   Dg Chest Port 1 View  Result Date: 03/21/2018 CLINICAL DATA:  57 year old male with a history of  empyema EXAM: PORTABLE CHEST 1 VIEW COMPARISON:  Multiple prior most recent 03/20/2018 FINDINGS: Cardiomediastinal silhouette unchanged with the right heart border and mediastinal border obscured by overlying lung/pleural disease. Left lung remains well aerated. Unchanged position of pigtail drainage catheter terminating at the right lung base at the cardiophrenic angle. Similar pattern of mixed airspace and interstitial opacity with intervening residual lucency of the right lung. Pleuroparenchymal thickening is unchanged. Lucency at the right lung base less conspicuous than the comparison. IMPRESSION: Unchanged right pigtail thoracostomy tube. Decreasing conspicuity of the lucency at the right lung base, however, a residual hydropneumothorax is difficult to exclude. Similar appearance of mixed interstitial and airspace opacity of the right lung, with residual pleural fluid/thickening. Electronically Signed   By: Corrie Mckusick D.O.   On: 03/21/2018 08:44   Dg Chest Port 1 View  Result Date: 03/20/2018 CLINICAL DATA:  Followup right chest tube and mild right basilar pneumothorax. EXAM: PORTABLE CHEST 1 VIEW COMPARISON:  03/18/2018. FINDINGS: A small caliber pigtail chest tube remains at the right lung base. Approximately 10% right basilar pneumothorax without significant overall change in size. No significant change in right pleural fluid and right lung airspace opacity and linear density. The left lung remains clear with stable mildly prominent interstitial markings. Thoracic spine degenerative changes. Normal sized heart. IMPRESSION: 1. Stable 10% right basilar pneumothorax. 2. Stable right pleural fluid and right lung atelectasis/pneumonia. 3. Stable mild chronic interstitial lung disease. Electronically Signed   By: Percell Locus.D.  On: 03/20/2018 09:36    Labs:  CBC: Recent Labs    03/19/18 0336 03/20/18 0324 03/21/18 0324 03/22/18 0439  WBC 7.7 6.6 7.1 6.5  HGB 13.3 12.8* 12.7* 12.8*  HCT  44.3 41.7 42.2 41.3  PLT 469* 455* 447* 389    COAGS: Recent Labs    03/04/18 1015 03/14/18 1339  INR 1.28 1.31    BMP: Recent Labs    03/19/18 0336 03/20/18 0324 03/21/18 0324 03/22/18 0439  NA 134* 133* 135 138  K 3.7 3.5 3.6 3.8  CL 97* 99 102 101  CO2 30 29 28 31   GLUCOSE 97 97 83 87  BUN <5* <5* <5* 6  CALCIUM 8.4* 8.2* 7.9* 8.3*  CREATININE 0.79 0.92 0.77 0.84  GFRNONAA >60 >60 >60 >60  GFRAA >60 >60 >60 >60    LIVER FUNCTION TESTS: Recent Labs    03/12/18 1241 03/16/18 0148  BILITOT 0.7 0.5  AST 16 13*  ALT 8 7  ALKPHOS 65 50  PROT 8.4* 7.3  ALBUMIN 1.8* 1.5*    Assessment and Plan:  Empyema  S/P pigtail chest tube placed by Dr. Anselm Pancoast on 03/14/18.  CT surg note seen from yesterday at 3pm - possible removal of CT today.  *Addendum* 12:21 pm   Called by CT surgery and asked to remove chest tube.  Chest tube removed without difficulty and chest X-ray pending.  Electronically Signed: Murrell Redden, PA-C 03/22/2018, 9:48 AM    I spent a total of 15 Minutes at the the patient's bedside AND on the patient's hospital floor or unit, greater than 50% of which was counseling/coordinating care for f/u chest tube.

## 2018-03-22 NOTE — Discharge Summary (Signed)
Physician Discharge Summary  Derrick Beltran YSA:[id number] DOB: 10-12-1960 DOA: 03/14/2018  PCP: Derrick Jeans, PA-C  Admit date: 03/14/2018 Discharge date: 03/22/2018  Admitted From: Home Disposition: Left AGAINST MEDICAL ADVICE  Recommendations for Outpatient Follow-up:  1. Follow up with PCP in 1-2 weeks 2. Please obtain BMP/CBC in one week your next doctors visit.  3. Patient left AGAINST MEDICAL ADVICE but I still sent in prescription of Augmentin for 14 days to his pharmacy. 4. He will need to follow-up outpatient with cardiothoracic surgery, their service to make follow-up arrangements   Brief/Interim Summary: 57 year old with past medical history of tobacco use, medical noncompliance came to the hospital about 2 weeks ago with complains of progressive weakness fatigue and hemoptysis.  At that time CT of the chest showed abnormal right upper lobe with right-sided pleural effusion.  There was plans for thoracentesis but patient ended up leaving [facility].  He returned to the ER again on 10/21 with similar complaints and agreed to stay for the admission.  IR was consulted and CT-guided right-sided [place] drain was placed on 10/21.  Cardiothoracic surgery, pulmonology and infectious disease were consulted on 10/22.  During the stay patient continues to have intermittent agitation requiring Ativan.  His AFB smear was negative therefore airborne precaution were discontinued.  Zosyn was changed to Unasyn on 03/20/2018.  With plans for total of 14 days of Augmentin.    As soon as chest tube was taken out patient left AGAINST MEDICAL ADVICE.  I sent a 14-day course of Augmentin to his pharmacy.  Per cardiothoracic surgery, outpatient follow-up appointment arrangements will be made by their service.    Discharge Diagnoses:  Principal Problem:   Empyema (Derrick Beltran) Active Problems:   Thrombocytosis (HCC)   Hyponatremia   Tobacco use   Malnutrition of moderate degree  Right-sided  pleural empyema with hydropneumothorax, stable Right upper lobe bronchiectasis -  Chest tube removed, right after patient left AGAINST MEDICAL ADVICE.  I have sent in Augmentin for 14 days to his pharmacy.  Outpatient arrangements for follow-up was to be made by CT surgery.  -Chest x-ray from 10/24- stable position of pleural catheter, slightly decreased right basilar pneumothorax, stable right lung findings.  Repeat chest x-ray 10/25 shows stable right lung diffuse opacity. - Cx= Prevotella (oral pathogen),  -HiV is negative - AFB smear-negative.  Discontinue airborne precaution per infectious disease. -Fungitell- negative -Aspergillus antibody- negative -Quantiferron TB - intermediate -Infectious disease consulted-appreciate input, repeat sputum cultures ordered -Vancomycin and Zosyn stopped, eventually switched to Augmentin from IV Unasyn - No plans for bronchoscopy at this point, malignancy eval can be performed outpatient on nonemergent basis once this acute illnesses over. - Urine strep is negative  Agitation and delirium with history of alcohol use - Continue alcohol withdrawal protocol  Tobacco use - Counseled to quit using tobacco.  Severe protein calorie malnutrition -Encourage oral intake  Patient left AGAINST MEDICAL ADVICE  Discharge Instructions   Allergies as of 03/22/2018   No Known Allergies     Medication List    TAKE these medications   amoxicillin-clavulanate 875-125 MG tablet Commonly known as:  AUGMENTIN Take 1 tablet by mouth every 12 (twelve) hours for 14 days.   hydrocortisone cream 1 % Place 1 application rectally as needed for itching.   naproxen sodium 220 MG tablet Commonly known as:  ALEVE Take 960 mg by mouth daily as needed (pain).      Follow-up Information    Derrick Nakayama, MD. Derrick Beltran  to.   Specialty:  Cardiothoracic Surgery Why:  PA/LAT CXR to be taken (at [facility] which is in the same building as Dr.  Leonarda Beltran office) on at;Appointment time is at Contact information: [place] [address] [phone]          No Known Allergies  You were cared for by a hospitalist during your hospital stay. If you have any questions about your discharge medications or the care you received while you were in the hospital after you are discharged, you can call the unit and asked to speak with the hospitalist on call if the hospitalist that took care of you is not available. Once you are discharged, your primary care physician will handle any further medical issues. Please note that no refills for any discharge medications will be authorized once you are discharged, as it is imperative that you return to your primary care physician (or establish a relationship with a primary care physician if you do not have one) for your aftercare needs so that they can reassess your need for medications and monitor your lab values.  Consultations:  Cardiothoracic surgery  Pulmonary  Infectious disease   Procedures/Studies: Dg Chest 1 View  Result Date: 03/22/2018 CLINICAL DATA:  Empyema. EXAM: CHEST  1 VIEW COMPARISON:  One-view chest x-ray 03/22/2018 and 03/21/2018. CT of the chest 03/03/2018. FINDINGS: There is no significant change in appearance of extensive right-sided airspace disease and the right-sided empyema following removal of a small bore chest tube. Loculated gas collections are stable. There is volume loss on the right.  The left lung is clear. IMPRESSION: 1. Stable appearance of extensive right-sided airspace disease and empyema following removal of right-sided chest tube. Electronically Signed   By: San Morelle M.D.   On: 03/22/2018 13:30   Ct Chest W Contrast  Result Date: 03/03/2018 CLINICAL DATA:  57 year old male with history of ill feeling for 1 week. Hemoptysis. Cough. Fevers. Forty-five pack-year history of smoking. EXAM: CT CHEST WITH CONTRAST  TECHNIQUE: Multidetector CT imaging of the chest was performed during intravenous contrast administration. CONTRAST:  34mL ISOVUE-300 IOPAMIDOL (ISOVUE-300) INJECTION 61% COMPARISON:  No priors. FINDINGS: Cardiovascular: Heart size is normal. There is no significant pericardial fluid, thickening or pericardial calcification. There is aortic atherosclerosis, as well as atherosclerosis of the great vessels of the mediastinum and the coronary arteries, including calcified atherosclerotic plaque in the left main, left anterior descending, left circumflex and right coronary arteries. Mediastinum/Nodes: Multiple prominent borderline enlarged mediastinal lymph nodes measuring up to 12 mm in short axis in the right paratracheal nodal station. Mildly enlarged subcarinal lymph node measuring 16 mm in short axis. Esophagus is unremarkable in appearance. No axillary lymphadenopathy. Lungs/Pleura: Large right pleural effusion located predominantly in the sub pulmonic region with extensive pleural thickening and enhancement, suspicious for empyema. Widespread airspace consolidation throughout the right lung, with extensive cylindrical and varicose bronchiectasis, as well as thickening of the peribronchovascular interstitium and chronic volume loss. Several cystic appearing spaces are also noted within the right lung, which may represent areas of cystic bronchiectasis or may represent small cavities. Adjacent to the right lung in the mid to upper right hemithorax there is also a small amount of pleural fluid, extensive pleural thickening and enhancement, and a small amount of gas throughout the pleural space which appears predominantly loculated. In the periphery of the left upper lobe there is a 1.3 x 0.9 cm nodule with macrolobulated margins (axial image 98 of series 3). Left  lung is otherwise clear. No left pleural effusion. Upper Abdomen: Aortic atherosclerosis. Musculoskeletal: There are no aggressive appearing lytic or  blastic lesions noted in the visualized portions of the skeleton. IMPRESSION: 1. Extensive chronic infectious changes throughout the right lung with widespread areas of bronchiectasis and post infectious scarring, with large complex right-sided hydropneumothorax which has an appearance most suggestive of a highly loculated empyema. 2. 1.3 x 0.9 cm nodule in the left upper lobe (axial image 98 of series 3). Given the findings in the right lung, this may simply be infectious or inflammatory in etiology, however, close attention on follow-up studies is recommended. Repeat chest CT is recommended in 3 months to further evaluate this lesion. This recommendation follows the consensus statement: Guidelines for Management of Incidental Pulmonary Nodules Detected on CT Images: From the Fleischner Society 2017; Radiology 2017; 284:228-243. 3. Aortic atherosclerosis, in addition to left main and 3 vessel coronary artery disease. Please note that although the presence of coronary artery calcium documents the presence of coronary artery disease, the severity of this disease and any potential stenosis cannot be assessed on this non-gated CT examination. Assessment for potential risk factor modification, dietary therapy or pharmacologic therapy may be warranted, if clinically indicated. These results will be called to the ordering clinician or representative by the Radiologist Assistant, and communication documented in the PACS or zVision Dashboard. Aortic Atherosclerosis (ICD10-I70.0). Electronically Signed   By: Vinnie Langton M.D.   On: 03/03/2018 14:14   Dg Chest Port 1 View  Result Date: 03/22/2018 CLINICAL DATA:  Follow-up empyema with chest tube treatment. EXAM: PORTABLE CHEST 1 VIEW COMPARISON:  Portable chest x-ray of October 01/11/2018 FINDINGS: The left lung is mildly hyperinflated. There is stable patchy density at the left base. There is mild shift of the mediastinum toward the right. Only a small amount of  aerated right lung is present. There is considerable pleural fluid and/or pleural thickening present consistent with known empyema. There is no pneumothorax. The small caliber right chest tube is stable in position at the right lung base. The pulmonary vascularity is not engorged. The cardiac silhouette is largely obscured. IMPRESSION: Stable appearance of the known empyema on the right. No pneumothorax. The small caliber chest tube is in stable position. Electronically Signed   By: David  Martinique M.D.   On: 03/22/2018 09:22   Dg Chest Port 1 View  Result Date: 03/21/2018 CLINICAL DATA:  57 year old male with a history of empyema EXAM: PORTABLE CHEST 1 VIEW COMPARISON:  Multiple prior most recent 03/20/2018 FINDINGS: Cardiomediastinal silhouette unchanged with the right heart border and mediastinal border obscured by overlying lung/pleural disease. Left lung remains well aerated. Unchanged position of pigtail drainage catheter terminating at the right lung base at the cardiophrenic angle. Similar pattern of mixed airspace and interstitial opacity with intervening residual lucency of the right lung. Pleuroparenchymal thickening is unchanged. Lucency at the right lung base less conspicuous than the comparison. IMPRESSION: Unchanged right pigtail thoracostomy tube. Decreasing conspicuity of the lucency at the right lung base, however, a residual hydropneumothorax is difficult to exclude. Similar appearance of mixed interstitial and airspace opacity of the right lung, with residual pleural fluid/thickening. Electronically Signed   By: Corrie Mckusick D.O.   On: 03/21/2018 08:44   Dg Chest Port 1 View  Result Date: 03/20/2018 CLINICAL DATA:  Followup right chest tube and mild right basilar pneumothorax. EXAM: PORTABLE CHEST 1 VIEW COMPARISON:  03/18/2018. FINDINGS: A small caliber pigtail chest tube remains at the right lung base.  Approximately 10% right basilar pneumothorax without significant overall change in  size. No significant change in right pleural fluid and right lung airspace opacity and linear density. The left lung remains clear with stable mildly prominent interstitial markings. Thoracic spine degenerative changes. Normal sized heart. IMPRESSION: 1. Stable 10% right basilar pneumothorax. 2. Stable right pleural fluid and right lung atelectasis/pneumonia. 3. Stable mild chronic interstitial lung disease. Electronically Signed   By: Claudie Revering M.D.   On: 03/20/2018 09:36   Dg Chest Port 1 View  Result Date: 03/18/2018 CLINICAL DATA:  Empyema of right lung. EXAM: PORTABLE CHEST 1 VIEW COMPARISON:  Radiograph of March 17, 2018. FINDINGS: The heart size and mediastinal contours are within normal limits. Left lung is clear. Stable diffuse opacity seen in right lung consistent with chronic inflammation or scarring. Stable position of right basilar chest tube. Stable mild right basilar pneumothorax is noted. The visualized skeletal structures are unremarkable. IMPRESSION: Stable position of right basilar chest tube with mild right basilar pneumothorax. Stable diffuse right lung opacity as described above. Electronically Signed   By: Marijo Conception, M.D.   On: 03/18/2018 10:21   Dg Chest Port 1 View  Result Date: 03/17/2018 CLINICAL DATA:  Empyema of right lung. EXAM: PORTABLE CHEST 1 VIEW COMPARISON:  Radiograph of March 15, 2018. FINDINGS: Stable cardiomediastinal silhouette. No pneumothorax is noted. Stable position of right basilar pleural drainage catheter. Continued presence of right basilar pneumothorax is noted most likely due to incomplete re-expansion of right lung due to cortical scarring and long-standing pleural effusion. Stable diffuse right lung densities are noted consistent with chronic scarring or infection. Bony thorax is unremarkable. IMPRESSION: Stable position of right basilar pleural drainage catheter. Slightly decreased right basilar pneumothorax is noted. Stable right lung  findings as described above. Electronically Signed   By: Marijo Conception, M.D.   On: 03/17/2018 10:20   Dg Chest Port 1 View  Result Date: 03/15/2018 CLINICAL DATA:  Right chest tube. EXAM: PORTABLE CHEST 1 VIEW COMPARISON:  CT 03/14/2018, 03/03/2018.  Chest x-ray 12/21/2017. FINDINGS: Right chest tube noted over the right lung base in stable position. Known right hydropneumothorax appears to have decreased in size. No evidence of prominent recurrent effusion. Small right pleural effusion may be present. Severe right pleuroparenchymal thickening noted most consistent with scarring. Chronic pulmonary changes including bronchiectasis are again noted unchanged. Previously identified left pulmonary nodule best identified on prior recent CT, reference is made to prior recent CT of 03/03/2018. Heart size normal. No acute bony abnormality or changes thoracic spine IMPRESSION: 1. Right chest tube noted over the right lung base in stable position. Known right hydropneumothorax appears to be decreased in size. No evidence of prominent recurrent effusion. Small right pleural effusion may be present. 2. Severe right pleural-parenchymal thickening again noted consistent scarring. Chronic pulmonary changes including bronchiectasis are again noted in the right lung. No interim change. Previously identified left pulmonary nodule best identified on prior recent CT, reference is made to prior chest CT of 03/03/2018. Electronically Signed   By: Marcello Moores  Register   On: 03/15/2018 07:52   Ct Image Guided Drainage By Percutaneous Catheter  Result Date: 03/14/2018 INDICATION: 57 year old with hemoptysis and probable right lung empyema. Patient presents for chest tube placement. EXAM: CT-GUIDED PLACEMENT OF RIGHT CHEST TUBE MEDICATIONS: Moderate sedation medication ANESTHESIA/SEDATION: Fentanyl 50 mcg IV; Versed 2 mg IV Moderate Sedation Time:  39 minutes The patient was continuously monitored during the procedure by the  interventional radiology nurse under  my direct supervision. COMPLICATIONS: None immediate. PROCEDURE: Informed written consent was obtained from the patient after a thorough discussion of the procedural risks, benefits and alternatives. All questions were addressed. Maximal Sterile Barrier Technique was utilized including caps, mask, sterile gowns, sterile gloves, sterile drape, hand hygiene and skin antiseptic. Procedure was also performed with respiratory droplet precautions. A timeout was performed prior to the initiation of the procedure. Patient was placed on his left side and CT images through the chest were obtained. The right mid axillary region was prepped and draped in sterile fashion. Skin and soft tissues were anesthetized with 1% lidocaine. Yueh catheter was directed into the pleural space and purulent thick yellow fluid was aspirated. A stiff Amplatz wire was placed. The tract was dilated to accommodate a 14 [place] multipurpose drain. Total of 850 mL of yellow purulent fluid was removed. Catheter was sutured to skin and attached to a PleurEvac. FINDINGS: Large fluid collection in the right lower chest. 850 mL of purulent fluid was removed from this cavity. Follow up CT images demonstrate a large hydropneumothorax following evacuation of most of the pleural fluid. Initial CT images again demonstrated bronchiectasis and severe parenchymal disease in the right upper lung. Complex hydropneumothorax along the right upper lobe. Again noted is a mildly spiculated nodule in the left lung which was present on the previous diagnostic examination. IMPRESSION: CT-guided placement of a chest tube in the right lung empyema. 850 mL of yellow purulent fluid was removed. Fluid was sent for analysis. Electronically Signed   By: Markus Daft M.D.   On: 03/14/2018 18:01      Subjective: Left AGAINST MEDICAL ADVICE   Discharge Exam: Vitals:   03/22/18 0830 03/22/18 1400  BP: 117/66 128/64  Pulse: (!) 54 (!) 52   Resp:  16  Temp: 97.8 F (36.6 C) 97.9 F (36.6 C)  SpO2: 100% 100%   Vitals:   03/21/18 1450 03/21/18 2205 03/22/18 0830 03/22/18 1400  BP: 133/77 (!) 142/69 117/66 128/64  Pulse: (!) 53 (!) 51 (!) 54 (!) 52  Resp: 18 18  16   Temp: 98.2 F (36.8 C) 98.4 F (36.9 C) 97.8 F (36.6 C) 97.9 F (36.6 C)  TempSrc: Oral Oral Oral Oral  SpO2: 99% 99% 100% 100%  Weight:      Height:        Unable to perform physical exam prior to patient leaving.    The results of significant diagnostics from this hospitalization (including imaging, microbiology, ancillary and laboratory) are listed below for reference.     Microbiology: Recent Results (from the past 240 hour(s))  Culture, blood (routine x 2) Call MD if unable to obtain prior to antibiotics being given     Status: None   Collection Time: 03/14/18  3:44 PM  Result Value Ref Range Status   Specimen Description BLOOD RIGHT ANTECUBITAL  Final   Special Requests   Final    BOTTLES DRAWN AEROBIC AND ANAEROBIC Blood Culture adequate volume   Culture   Final    NO GROWTH 5 DAYS Performed at [facility], [address] [place], [place] [address]    Report Status 03/19/2018 FINAL  Final  Culture, blood (routine x 2) Call MD if unable to obtain prior to antibiotics being given     Status: None   Collection Time: 03/14/18  3:55 PM  Result Value Ref Range Status   Specimen Description BLOOD LEFT ANTECUBITAL  Final   Special Requests   Final  BOTTLES DRAWN AEROBIC AND ANAEROBIC Blood Culture adequate volume   Culture   Final    NO GROWTH 5 DAYS Performed at [facility], [place] [address] [place], [place] [address]    Report Status 03/19/2018 FINAL  Final  Acid Fast Smear (AFB)     Status: None   Collection Time: 03/14/18  4:18 PM  Result Value Ref Range Status   AFB Specimen Processing Concentration  Final   Acid Fast Smear Negative  Final    Comment: (NOTE) Performed At: [facility] [place], [place] [id number] Rush Farmer MD [id number]    Source (AFB) PLEURAL  Final    Comment: Performed at [facility], [place] [address], [place], [place] [address]  Fungus Culture With Stain     Status: None (Preliminary result)   Collection Time: 03/14/18  4:18 PM  Result Value Ref Range Status   Fungus Stain Final report  Final    Comment: (NOTE) Performed At: [facility] [place], [place] [id number] Rush Farmer MD [id number]    Fungus (Mycology) Culture PENDING  Incomplete   Fungal Source PLEURAL  Final    Comment: Performed at [facility], [place] [address], [place], [place] [address]  Fungus Culture Result     Status: None   Collection Time: 03/14/18  4:18 PM  Result Value Ref Range Status   Result 1 Comment  Final    Comment: (NOTE) KOH/Calcofluor preparation:  no fungus observed. Performed At: [facility] [place], [place] [id number] Rush Farmer MD [id number]   Aerobic/Anaerobic Culture (surgical/deep wound)     Status: None   Collection Time: 03/14/18  5:41 PM  Result Value Ref Range Status   Specimen Description ABSCESS  Final   Special Requests Normal  Final   Gram Stain   Final    ABUNDANT WBC PRESENT, PREDOMINANTLY PMN ABUNDANT GRAM NEGATIVE RODS    Culture   Final    MODERATE PREVOTELLA SPECIES BETA LACTAMASE POSITIVE Performed at [facility], [address], [place], [place] [address]    Report Status 03/19/2018 FINAL  Final  Culture, sputum-assessment     Status: None   Collection Time: 03/14/18 10:44 PM  Result Value Ref Range Status   Specimen Description EXPECTORATED SPUTUM  Final   Special Requests Normal  Final   Sputum evaluation   Final    THIS SPECIMEN IS ACCEPTABLE FOR SPUTUM CULTURE Performed at [facility], [place] [address] [place], [place] [address]    Report Status 03/16/2018 FINAL  Final  Culture, respiratory     Status: None   Collection Time:  03/14/18 10:44 PM  Result Value Ref Range Status   Specimen Description EXPECTORATED SPUTUM  Final   Special Requests Normal Reflexed from [id number]  Final   Gram Stain   Final    ABUNDANT WBC PRESENT,BOTH PMN AND MONONUCLEAR FEW GRAM POSITIVE COCCI FEW GRAM VARIABLE ROD FEW SQUAMOUS EPITHELIAL CELLS PRESENT    Culture   Final    Consistent with normal respiratory flora. Performed at [facility], [place] [address] [place],  [address]    Report Status 03/18/2018 FINAL  Final  Acid Fast Smear (AFB)     Status: None   Collection Time: 03/17/18 12:57 AM  Result Value Ref Range Status   AFB Specimen Processing Concentration  Final   Acid Fast Smear Negative  Final    Comment: (  NOTE) Performed At: [facility] [place], [place] [id number] Rush Farmer MD [id number]    Source (AFB) EXPECTORATED SPUTUM  Final    Comment: Performed at [facility], [place] [address], [place], [place] [address]  Expectorated sputum assessment w rflx to resp cult     Status: None   Collection Time: 03/17/18 12:58 AM  Result Value Ref Range Status   Specimen Description EXPECTORATED SPUTUM  Final   Special Requests NONE  Final   Sputum evaluation   Final    THIS SPECIMEN IS ACCEPTABLE FOR SPUTUM CULTURE Performed at [facility], [place] [address], [place], [place] [address]    Report Status 03/17/2018 FINAL  Final  Culture, respiratory     Status: None   Collection Time: 03/17/18 12:58 AM  Result Value Ref Range Status   Specimen Description EXPECTORATED SPUTUM  Final   Special Requests NONE Reflexed from [id number]  Final   Gram Stain   Final    ABUNDANT WBC PRESENT, PREDOMINANTLY PMN RARE GRAM VARIABLE ROD    Culture   Final    FEW Consistent with normal respiratory flora. Performed at [facility], [place] [address] [place], [place] [address]    Report Status 03/19/2018 FINAL  Final  Expectorated sputum assessment w rflx to resp cult     Status: None    Collection Time: 03/17/18 10:30 AM  Result Value Ref Range Status   Specimen Description SPUTUM  Final   Special Requests NONE  Final   Sputum evaluation   Final    Sputum specimen not acceptable for testing.  Please recollect.   RESULT CALLED TO, READ BACK BY AND VERIFIED WITH: PERRY RN AT 1143 ON [id number] BY SJW Performed at [facility], [place] [address] [place], [place] [address]    Report Status 03/18/2018 FINAL  Final  Acid Fast Smear (AFB)     Status: None   Collection Time: 03/18/18 10:15 AM  Result Value Ref Range Status   AFB Specimen Processing Concentration  Final   Acid Fast Smear Negative  Final    Comment: (NOTE) Performed At: [facility] [place], [place] [id number] Rush Farmer MD [id number]:[id number]    Source (AFB) SPUTUM  Final    Comment: Performed at [facility], [place] [address], [place], [place] [address]     Labs: BNP (last 3 results) No results for input(s): BNP in the last 8760 hours. Basic Metabolic Panel: Recent Labs  Lab 03/18/18 0551 03/19/18 0336 03/20/18 0324 03/21/18 0324 03/22/18 0439  NA 134* 134* 133* 135 138  K 3.2* 3.7 3.5 3.6 3.8  CL 98 97* 99 102 101  CO2 28 30 29 28 31   GLUCOSE 103* 97 97 83 87  BUN <5* <5* <5* <5* 6  CREATININE 0.68 0.79 0.92 0.77 0.84  CALCIUM 8.4* 8.4* 8.2* 7.9* 8.3*  MG 1.8 1.8 1.9 1.7 1.7   Liver Function Tests: Recent Labs  Lab 03/16/18 0148  AST 13*  ALT 7  ALKPHOS 50  BILITOT 0.5  PROT 7.3  ALBUMIN 1.5*   No results for input(s): LIPASE, AMYLASE in the last 168 hours. No results for input(s): AMMONIA in the last 168 hours. CBC: Recent Labs  Lab 03/18/18 0551 03/18/18 0825 03/19/18 0336 03/20/18 0324 03/21/18 0324 03/22/18 0439  WBC 6.1  --  7.7 6.6 7.1 6.5  HGB 11.9*  --  13.3 12.8* 12.7* 12.8*  HCT 39.7  39.4 44.3 41.7 42.2 41.3  MCV 88.4  --  89.0 87.8 87.4 88.6  PLT 471*  --  469* 455* 447* 389   Cardiac Enzymes: No results for input(s): CKTOTAL,  CKMB, CKMBINDEX, TROPONINI in the last 168 hours. BNP: Invalid input(s): POCBNP CBG: No results for input(s): GLUCAP in the last 168 hours. D-Dimer No results for input(s): DDIMER in the last 72 hours. Hgb A1c No results for input(s): HGBA1C in the last 72 hours. Lipid Profile No results for input(s): CHOL, HDL, LDLCALC, TRIG, CHOLHDL, LDLDIRECT in the last 72 hours. Thyroid function studies No results for input(s): TSH, T4TOTAL, T3FREE, THYROIDAB in the last 72 hours.  Invalid input(s): FREET3 Anemia work up No results for input(s): VITAMINB12, FOLATE, FERRITIN, TIBC, IRON, RETICCTPCT in the last 72 hours. Urinalysis No results found for: COLORURINE, APPEARANCEUR, [place], [place], [place], [place], [place], [place], PROTEINUR, UROBILINOGEN, NITRITE, LEUKOCYTESUR Sepsis Labs Invalid input(s): PROCALCITONIN,  WBC,  LACTICIDVEN Microbiology Recent Results (from the past 240 hour(s))  Culture, blood (routine x 2) Call MD if unable to obtain prior to antibiotics being given     Status: None   Collection Time: 03/14/18  3:44 PM  Result Value Ref Range Status   Specimen Description BLOOD RIGHT ANTECUBITAL  Final   Special Requests   Final    BOTTLES DRAWN AEROBIC AND ANAEROBIC Blood Culture adequate volume   Culture   Final    NO GROWTH 5 DAYS Performed at [facility], [address] [place], [place] [address]    Report Status 03/19/2018 FINAL  Final  Culture, blood (routine x 2) Call MD if unable to obtain prior to antibiotics being given     Status: None   Collection Time: 03/14/18  3:55 PM  Result Value Ref Range Status   Specimen Description BLOOD LEFT ANTECUBITAL  Final   Special Requests   Final    BOTTLES DRAWN AEROBIC AND ANAEROBIC Blood Culture adequate volume   Culture   Final    NO GROWTH 5 DAYS Performed at [facility], [place] [address] [place], [place] [address]    Report Status 03/19/2018 FINAL  Final  Acid Fast Smear (AFB)     Status: None    Collection Time: 03/14/18  4:18 PM  Result Value Ref Range Status   AFB Specimen Processing Concentration  Final   Acid Fast Smear Negative  Final    Comment: (NOTE) Performed At: [facility] [place], [place] [id number] Rush Farmer MD [id number]    Source (AFB) PLEURAL  Final    Comment: Performed at [facility], [place] [address], [place], [place] [address]  Fungus Culture With Stain     Status: None (Preliminary result)   Collection Time: 03/14/18  4:18 PM  Result Value Ref Range Status   Fungus Stain Final report  Final    Comment: (NOTE) Performed At: [facility] [place], [place] [id number] Rush Farmer MD [id number]    Fungus (Mycology) Culture PENDING  Incomplete   Fungal Source PLEURAL  Final    Comment: Performed at [facility], [place] [address] [place],  [address]  Fungus Culture Result     Status: None   Collection Time: 03/14/18  4:18 PM  Result Value Ref Range Status   Result 1 Comment  Final    Comment: (NOTE) KOH/Calcofluor preparation:  no fungus observed. Performed At: [facility] [address] [place], [place] [id number] Rush Farmer  MD [id number]   Aerobic/Anaerobic Culture (surgical/deep wound)     Status: None   Collection Time: 03/14/18  5:41 PM  Result Value Ref Range Status   Specimen Description ABSCESS  Final   Special Requests Normal  Final   Gram Stain   Final    ABUNDANT WBC PRESENT, PREDOMINANTLY PMN ABUNDANT GRAM NEGATIVE RODS    Culture   Final    MODERATE PREVOTELLA SPECIES BETA LACTAMASE POSITIVE Performed at [facility], [address], [place], [place] [address]    Report Status 03/19/2018 FINAL  Final  Culture, sputum-assessment     Status: None   Collection Time: 03/14/18 10:44 PM  Result Value Ref Range Status   Specimen Description EXPECTORATED SPUTUM  Final   Special Requests Normal  Final   Sputum evaluation   Final    THIS  SPECIMEN IS ACCEPTABLE FOR SPUTUM CULTURE Performed at [facility], [place] [address] [place], [place] [address]    Report Status 03/16/2018 FINAL  Final  Culture, respiratory     Status: None   Collection Time: 03/14/18 10:44 PM  Result Value Ref Range Status   Specimen Description EXPECTORATED SPUTUM  Final   Special Requests Normal Reflexed from [id number]  Final   Gram Stain   Final    ABUNDANT WBC PRESENT,BOTH PMN AND MONONUCLEAR FEW GRAM POSITIVE COCCI FEW GRAM VARIABLE ROD FEW SQUAMOUS EPITHELIAL CELLS PRESENT    Culture   Final    Consistent with normal respiratory flora. Performed at [facility], [place] [address], [place], [place] [address]    Report Status 03/18/2018 FINAL  Final  Acid Fast Smear (AFB)     Status: None   Collection Time: 03/17/18 12:57 AM  Result Value Ref Range Status   AFB Specimen Processing Concentration  Final   Acid Fast Smear Negative  Final    Comment: (NOTE) Performed At: [facility] [place], [place] [id number] Rush Farmer MD [id number]    Source (AFB) EXPECTORATED SPUTUM  Final    Comment: Performed at [facility], [place] [address], [place], [place] [address]  Expectorated sputum assessment w rflx to resp cult     Status: None   Collection Time: 03/17/18 12:58 AM  Result Value Ref Range Status   Specimen Description EXPECTORATED SPUTUM  Final   Special Requests NONE  Final   Sputum evaluation   Final    THIS SPECIMEN IS ACCEPTABLE FOR SPUTUM CULTURE Performed at [facility], [place] [address], [place], [place] [address]    Report Status 03/17/2018 FINAL  Final  Culture, respiratory     Status: None   Collection Time: 03/17/18 12:58 AM  Result Value Ref Range Status   Specimen Description EXPECTORATED SPUTUM  Final   Special Requests NONE Reflexed from [id number]  Final   Gram Stain   Final    ABUNDANT WBC PRESENT, PREDOMINANTLY PMN RARE GRAM VARIABLE ROD    Culture   Final    FEW  Consistent with normal respiratory flora. Performed at [facility], [place] [address], [place], [place] [address]    Report Status 03/19/2018 FINAL  Final  Expectorated sputum assessment w rflx to resp cult     Status: None   Collection Time: 03/17/18 10:30 AM  Result Value Ref Range Status   Specimen Description SPUTUM  Final   Special Requests NONE  Final   Sputum evaluation   Final  Sputum specimen not acceptable for testing.  Please recollect.   RESULT CALLED TO, READ BACK BY AND VERIFIED WITH: PERRY RN AT 1143 ON [id number] BY SJW Performed at [facility], [place] [address], [place], [place] [address]    Report Status 03/18/2018 FINAL  Final  Acid Fast Smear (AFB)     Status: None   Collection Time: 03/18/18 10:15 AM  Result Value Ref Range Status   AFB Specimen Processing Concentration  Final   Acid Fast Smear Negative  Final    Comment: (NOTE) Performed At: [facility] [place], [place] [id number] Rush Farmer MD [id number]    Source (AFB) SPUTUM  Final    Comment: Performed at [facility], [place] [address], [place], [place] [address]     Time coordinating discharge:  I have spent 35 minutes face to face with the patient and on the ward discussing the patients care, assessment, plan and disposition with other care givers. >50% of the time was devoted counseling the patient about the risks and benefits of treatment/Discharge disposition and coordinating care.   SIGNED:   Damita Lack, MD  [facility] 03/22/2018, 4:08 PM Pager   If 7PM-7AM, please contact night-coverage [email or web address] Password [email or web address]

## 2018-03-22 NOTE — Progress Notes (Signed)
PROGRESS NOTE    Derrick Beltran  ZOX:[id number] DOB: 11-04-1960 DOA: 03/14/2018 PCP: [name], PA-C   Brief Narrative:  57 year old with past medical history of tobacco use, medical noncompliance came to the hospital about 2 weeks ago with complains of progressive weakness fatigue and hemoptysis.  At that time CT of the chest showed abnormal right upper lobe with right-sided pleural effusion.  There was plans for thoracentesis but patient ended up leaving [facility].  He returned to the ER again on 10/21 with similar complaints and agreed to stay for the admission.  IR was consulted and CT-guided right-sided [place] drain was placed on 10/21.  Cardiothoracic surgery, pulmonology and infectious disease were consulted on 10/22.  During the stay patient continues to have intermittent agitation requiring Ativan.  His AFB smear was negative therefore airborne precaution were discontinued.  Zosyn was changed to Unasyn on 03/20/2018.  With plans for total of 14-21 days of Augmentin.  Plans to remove chest tube on 03/22/2018 and repeat chest x-ray the following day.   Assessment & Plan:   Principal Problem:   Empyema ([place]) Active Problems:   Thrombocytosis (HCC)   Hyponatremia   Tobacco use   Malnutrition of moderate degree  Right-sided pleural empyema with hydropneumothorax, stable Right upper lobe bronchiectasis - Cardiothoracic team following-planning to remove chest tube today and get follow-up chest x-ray tomorrow morning.  Should likely be able to discharge and after with outpatient follow-up with CT surgery team. -Chest tube is in place since 03/14/2018 draining purulent/serosanguineous fluid.  TPA performed by cardiothoracic surgery.  Hoping to get some results this way and to avoid VATS procedure as patient is also reluctant to do this.   -Chest x-ray from 10/24- stable position of pleural catheter, slightly decreased right basilar pneumothorax, stable right lung findings.   Repeat chest x-ray 10/25 shows stable right lung diffuse opacity. - Cx= Prevotella (oral pathogen),  -HiV is negative - AFB smear-negative.  Discontinue airborne precaution per infectious disease. -Fungitell- negative -Aspergillus antibody- negative -Quantiferron TB - intermediate -Infectious disease consulted-appreciate input, repeat sputum cultures ordered -Zosyn stopped and switched to Unasyn 10/27, with plans to discharge him on Augmentin for total of 14-21 days - No plans for bronchoscopy at this point, malignancy eval can be performed outpatient on nonemergent basis once this acute illnesses over. - Urine strep is negative -Chest tube management per cardiothoracic team.  Agitation and delirium with history of alcohol use - Continue alcohol withdrawal protocol  Tobacco use - Counseled to quit using tobacco.  Severe protein calorie malnutrition -Encourage oral intake  DVT prophylaxis: SCDs Code Status: Full code Family Communication: None at bedside Disposition Plan: Plans to remove chest tube today and get a repeat chest x-ray tomorrow morning and monitor him overnight.  Will require about 14-21 days of oral antibiotics and follow-up outpatient with CT surgery.  If remains stable can be discharged next 24 hours after the chest tube is out  Patient is at very high risk of leaving [facility].  Consultants:  Interventional radiology Infectious disease Cardiothoracic surgery Pulmonary critical care medicine  Procedures:   CT-guided French catheter placement in the chest 10/21  Antimicrobials:   IV Zosyn   Subjective: Patient feels a little better.  He continues to state that he wants to leave and may end up leaving [place] later today.  I explained to him the importance of staying in the hospital until medically cleared.  Review of Systems Otherwise negative except as  per HPI, including: General = no fevers, chills, dizziness, malaise,  fatigue HEENT/EYES = negative for pain, redness, loss of vision, double vision, blurred vision, loss of hearing, sore throat, hoarseness, dysphagia Cardiovascular= negative for chest pain, palpitation, murmurs, lower extremity swelling Respiratory/lungs= negative for shortness of breath, cough, hemoptysis, wheezing, mucus production Gastrointestinal= negative for nausea, vomiting,, abdominal pain, melena, hematemesis Genitourinary= negative for Dysuria, Hematuria, Change in Urinary Frequency MSK = Negative for arthralgia, myalgias, Back Pain, Joint swelling  Neurology= Negative for headache, seizures, numbness, tingling  Psychiatry= Negative for anxiety, depression, suicidal and homocidal ideation Allergy/Immunology= Medication/Food allergy as listed  Skin= Negative for Rash, lesions, ulcers, itching  Objective: Vitals:   03/21/18 0356 03/21/18 1450 03/21/18 2205 03/22/18 0830  BP: 130/75 133/77 (!) 142/69 117/66  Pulse: (!) 59 (!) 53 (!) 51 (!) 54  Resp: 20 18 18    Temp: 97.7 F (36.5 C) 98.2 F (36.8 C) 98.4 F (36.9 C) 97.8 F (36.6 C)  TempSrc: Oral Oral Oral Oral  SpO2: 99% 99% 99% 100%  Weight:      Height:        Intake/Output Summary (Last 24 hours) at 03/22/2018 1220 Last data filed at 03/22/2018 1022 Gross per 24 hour  Intake 120 ml  Output 450 ml  Net -330 ml   Filed Weights   03/16/18 1603  Weight: 62.5 kg    Examination:  Constitutional: NAD, calm, comfortable Eyes: PERRL, lids and conjunctivae normal ENMT: Mucous membranes are moist. Posterior pharynx clear of any exudate or lesions.Normal dentition.  Neck: normal, supple, no masses, no thyromegaly Respiratory: Diminished breath sounds at the bases especially on the right side Cardiovascular: Regular rate and rhythm, no murmurs / rubs / gallops. No extremity edema. 2+ pedal pulses. No carotid bruits.  Abdomen: no tenderness, no masses palpated. No hepatosplenomegaly. Bowel sounds positive.    Musculoskeletal: no clubbing / cyanosis. No joint deformity upper and lower extremities. Good ROM, no contractures. Normal muscle tone.  Skin: no rashes, lesions, ulcers. No induration Neurologic: CN 2-12 grossly intact. Sensation intact, DTR normal. Strength 5/5 in all 4.  Psychiatric: Normal judgment and insight. Alert and oriented x 3. Normal mood.  Chest tube/pigtail catheter in place with serous discharge.  Data Reviewed:   CBC: Recent Labs  Lab 03/18/18 0551 03/18/18 0825 03/19/18 0336 03/20/18 0324 03/21/18 0324 03/22/18 0439  WBC 6.1  --  7.7 6.6 7.1 6.5  HGB 11.9*  --  13.3 12.8* 12.7* 12.8*  HCT 39.7 39.4 44.3 41.7 42.2 41.3  MCV 88.4  --  89.0 87.8 87.4 88.6  PLT 471*  --  469* 455* 447* 655   Basic Metabolic Panel: Recent Labs  Lab 03/18/18 0551 03/19/18 0336 03/20/18 0324 03/21/18 0324 03/22/18 0439  NA 134* 134* 133* 135 138  K 3.2* 3.7 3.5 3.6 3.8  CL 98 97* 99 102 101  CO2 28 30 29 28 31   GLUCOSE 103* 97 97 83 87  BUN <5* <5* <5* <5* 6  CREATININE 0.68 0.79 0.92 0.77 0.84  CALCIUM 8.4* 8.4* 8.2* 7.9* 8.3*  MG 1.8 1.8 1.9 1.7 1.7   GFR: Estimated Creatinine Clearance: 85.8 mL/min (by C-G formula based on SCr of 0.84 mg/dL). Liver Function Tests: Recent Labs  Lab 03/16/18 0148  AST 13*  ALT 7  ALKPHOS 50  BILITOT 0.5  PROT 7.3  ALBUMIN 1.5*   No results for input(s): LIPASE, AMYLASE in the last 168 hours. No results for input(s): AMMONIA in the last 168  hours. Coagulation Profile: No results for input(s): INR, PROTIME in the last 168 hours. Cardiac Enzymes: No results for input(s): CKTOTAL, CKMB, CKMBINDEX, TROPONINI in the last 168 hours. BNP (last 3 results) No results for input(s): PROBNP in the last 8760 hours. HbA1C: No results for input(s): HGBA1C in the last 72 hours. CBG: No results for input(s): GLUCAP in the last 168 hours. Lipid Profile: No results for input(s): CHOL, HDL, LDLCALC, TRIG, CHOLHDL, LDLDIRECT in the last 72  hours. Thyroid Function Tests: No results for input(s): TSH, T4TOTAL, FREET4, T3FREE, THYROIDAB in the last 72 hours. Anemia Panel: No results for input(s): VITAMINB12, FOLATE, FERRITIN, TIBC, IRON, RETICCTPCT in the last 72 hours. Sepsis Labs: No results for input(s): PROCALCITON, LATICACIDVEN in the last 168 hours.  Recent Results (from the past 240 hour(s))  Culture, blood (routine x 2) Call MD if unable to obtain prior to antibiotics being given     Status: None   Collection Time: 03/14/18  3:44 PM  Result Value Ref Range Status   Specimen Description BLOOD RIGHT ANTECUBITAL  Final   Special Requests   Final    BOTTLES DRAWN AEROBIC AND ANAEROBIC Blood Culture adequate volume   Culture   Final    NO GROWTH 5 DAYS Performed at [facility], [address] [place], [place] [address]    Report Status 03/19/2018 FINAL  Final  Culture, blood (routine x 2) Call MD if unable to obtain prior to antibiotics being given     Status: None   Collection Time: 03/14/18  3:55 PM  Result Value Ref Range Status   Specimen Description BLOOD LEFT ANTECUBITAL  Final   Special Requests   Final    BOTTLES DRAWN AEROBIC AND ANAEROBIC Blood Culture adequate volume   Culture   Final    NO GROWTH 5 DAYS Performed at [facility], [place] [address], [place], [place] [address]    Report Status 03/19/2018 FINAL  Final  Acid Fast Smear (AFB)     Status: None   Collection Time: 03/14/18  4:18 PM  Result Value Ref Range Status   AFB Specimen Processing Concentration  Final   Acid Fast Smear Negative  Final    Comment: (NOTE) Performed At: [facility] [place], [place] [id number] Rush Farmer MD YK:[id number]    Source (AFB) PLEURAL  Final    Comment: Performed at [facility], [place] [address], [place], [place] [address]  Fungus Culture With Stain     Status: None (Preliminary result)   Collection Time: 03/14/18  4:18 PM  Result Value Ref Range Status   Fungus  Stain Final report  Final    Comment: (NOTE) Performed At: [facility] [place], [place] [id number] Rush Farmer MD MA:[id number]    Fungus (Mycology) Culture PENDING  Incomplete   Fungal Source PLEURAL  Final    Comment: Performed at [facility], [place] [address], [place], [place] [address]  Fungus Culture Result     Status: None   Collection Time: 03/14/18  4:18 PM  Result Value Ref Range Status   Result 1 Comment  Final    Comment: (NOTE) KOH/Calcofluor preparation:  no fungus observed. Performed At: [facility] [place], [place] [id number] Rush Farmer MD TL:[id number]   Aerobic/Anaerobic Culture (surgical/deep wound)     Status: None   Collection Time: 03/14/18  5:41 PM  Result Value Ref Range Status   Specimen Description  ABSCESS  Final   Special Requests Normal  Final   Gram Stain   Final    ABUNDANT WBC PRESENT, PREDOMINANTLY PMN ABUNDANT GRAM NEGATIVE RODS    Culture   Final    MODERATE PREVOTELLA SPECIES BETA LACTAMASE POSITIVE Performed at [facility], [address] [place], [place] [address]    Report Status 03/19/2018 FINAL  Final  Culture, sputum-assessment     Status: None   Collection Time: 03/14/18 10:44 PM  Result Value Ref Range Status   Specimen Description EXPECTORATED SPUTUM  Final   Special Requests Normal  Final   Sputum evaluation   Final    THIS SPECIMEN IS ACCEPTABLE FOR SPUTUM CULTURE Performed at [facility], [place] [address], [place], [place] [address]    Report Status 03/16/2018 FINAL  Final  Culture, respiratory     Status: None   Collection Time: 03/14/18 10:44 PM  Result Value Ref Range Status   Specimen Description EXPECTORATED SPUTUM  Final   Special Requests Normal Reflexed from [id number]  Final   Gram Stain   Final    ABUNDANT WBC PRESENT,BOTH PMN AND MONONUCLEAR FEW GRAM POSITIVE COCCI FEW GRAM VARIABLE ROD FEW SQUAMOUS EPITHELIAL CELLS PRESENT    Culture    Final    Consistent with normal respiratory flora. Performed at [facility], [place] [address] [place], [place] [address]    Report Status 03/18/2018 FINAL  Final  Acid Fast Smear (AFB)     Status: None   Collection Time: 03/17/18 12:57 AM  Result Value Ref Range Status   AFB Specimen Processing Concentration  Final   Acid Fast Smear Negative  Final    Comment: (NOTE) Performed At: [facility] [place], [place] [id number] Rush Farmer MD [id number]    Source (AFB) EXPECTORATED SPUTUM  Final    Comment: Performed at [facility], [place] [address], [place], [place] [address]  Expectorated sputum assessment w rflx to resp cult     Status: None   Collection Time: 03/17/18 12:58 AM  Result Value Ref Range Status   Specimen Description EXPECTORATED SPUTUM  Final   Special Requests NONE  Final   Sputum evaluation   Final    THIS SPECIMEN IS ACCEPTABLE FOR SPUTUM CULTURE Performed at [facility], [place] [address] [place], [place] [address]    Report Status 03/17/2018 FINAL  Final  Culture, respiratory     Status: None   Collection Time: 03/17/18 12:58 AM  Result Value Ref Range Status   Specimen Description EXPECTORATED SPUTUM  Final   Special Requests NONE Reflexed from [id number]  Final   Gram Stain   Final    ABUNDANT WBC PRESENT, PREDOMINANTLY PMN RARE GRAM VARIABLE ROD    Culture   Final    FEW Consistent with normal respiratory flora. Performed at [facility], [place] [address] [place], [place] [address]    Report Status 03/19/2018 FINAL  Final  Expectorated sputum assessment w rflx to resp cult     Status: None   Collection Time: 03/17/18 10:30 AM  Result Value Ref Range Status   Specimen Description SPUTUM  Final   Special Requests NONE  Final   Sputum evaluation   Final    Sputum specimen not acceptable for testing.  Please recollect.   RESULT CALLED TO, READ BACK BY AND VERIFIED WITH: PERRY RN AT 1143 ON [id number] BY  SJW Performed at [facility], [place] [address] [place], [place] [address]    Report Status 03/18/2018 FINAL  Final  Acid Fast Smear (AFB)     Status: None   Collection Time: 03/18/18 10:15 AM  Result Value Ref Range Status   AFB Specimen Processing Concentration  Final   Acid Fast Smear Negative  Final    Comment: (NOTE) Performed At: [facility] [place], [place] [id number] Rush Farmer MD OZ:[id number]    Source (AFB) SPUTUM  Final    Comment: Performed at [facility], [place] [address], [place], [place] [address]         Radiology Studies: Dg Chest Port 1 View  Result Date: 03/22/2018 CLINICAL DATA:  Follow-up empyema with chest tube treatment. EXAM: PORTABLE CHEST 1 VIEW COMPARISON:  Portable chest x-ray of October 01/11/2018 FINDINGS: The left lung is mildly hyperinflated. There is stable patchy density at the left base. There is mild shift of the mediastinum toward the right. Only a small amount of aerated right lung is present. There is considerable pleural fluid and/or pleural thickening present consistent with known empyema. There is no pneumothorax. The small caliber right chest tube is stable in position at the right lung base. The pulmonary vascularity is not engorged. The cardiac silhouette is largely obscured. IMPRESSION: Stable appearance of the known empyema on the right. No pneumothorax. The small caliber chest tube is in stable position. Electronically Signed   By: David  Martinique M.D.   On: 03/22/2018 09:22   Dg Chest Port 1 View  Result Date: 03/21/2018 CLINICAL DATA:  57 year old male with a history of empyema EXAM: PORTABLE CHEST 1 VIEW COMPARISON:  Multiple prior most recent 03/20/2018 FINDINGS: Cardiomediastinal silhouette unchanged with the right heart border and mediastinal border obscured by overlying lung/pleural disease. Left lung remains well aerated. Unchanged position of pigtail drainage catheter terminating at the right  lung base at the cardiophrenic angle. Similar pattern of mixed airspace and interstitial opacity with intervening residual lucency of the right lung. Pleuroparenchymal thickening is unchanged. Lucency at the right lung base less conspicuous than the comparison. IMPRESSION: Unchanged right pigtail thoracostomy tube. Decreasing conspicuity of the lucency at the right lung base, however, a residual hydropneumothorax is difficult to exclude. Similar appearance of mixed interstitial and airspace opacity of the right lung, with residual pleural fluid/thickening. Electronically Signed   By: Corrie Mckusick D.O.   On: 03/21/2018 08:44        Scheduled Meds: . folic acid  1 mg Oral Daily  . hydrocortisone   Rectal BID  . lidocaine  1 patch Transdermal Q24H  . multivitamin with minerals  1 tablet Oral Daily  . nicotine  21 mg Transdermal Daily  . nystatin  5 mL Oral QID  . thiamine  100 mg Oral Daily   Or  . thiamine  100 mg Intravenous Daily   Continuous Infusions: . ampicillin-sulbactam (UNASYN) IV 3 g (03/22/18 0807)     LOS: 8 days   Time spent= 15 mins    Marnita Poirier Arsenio Loader, MD [facility] Pager [phone]   If 7PM-7AM, please contact night-coverage [email or web address] Password [email or web address] 03/22/2018, 12:20 PM

## 2018-03-23 NOTE — Telephone Encounter (Signed)
Patient left hospital AMA again despite significant illness. Will not complete FMLA forms. He is also dismissed from this practice due to non-compliance and inability to form a therapeutic relationship.

## 2018-03-24 ENCOUNTER — Encounter: Payer: Self-pay | Admitting: Physician Assistant

## 2018-03-24 NOTE — Telephone Encounter (Signed)
Called pts wife and advised that Mr Falletta was being discharged from the practice due to medical non compliance. Advised that paperwork would not be completed and could be picked up at the front desk.

## 2018-04-11 ENCOUNTER — Other Ambulatory Visit: Payer: Self-pay | Admitting: Thoracic Surgery (Cardiothoracic Vascular Surgery)

## 2018-04-11 DIAGNOSIS — J869 Pyothorax without fistula: Secondary | ICD-10-CM

## 2018-04-12 ENCOUNTER — Ambulatory Visit (INDEPENDENT_AMBULATORY_CARE_PROVIDER_SITE_OTHER): Payer: PRIVATE HEALTH INSURANCE | Admitting: Thoracic Surgery (Cardiothoracic Vascular Surgery)

## 2018-04-12 ENCOUNTER — Ambulatory Visit
Admission: RE | Admit: 2018-04-12 | Discharge: 2018-04-12 | Disposition: A | Discharge status: Home / Self Care | Payer: PRIVATE HEALTH INSURANCE | Source: Ambulatory Visit | Attending: Thoracic Surgery (Cardiothoracic Vascular Surgery) | Admitting: Thoracic Surgery (Cardiothoracic Vascular Surgery)

## 2018-04-12 ENCOUNTER — Other Ambulatory Visit: Payer: Self-pay

## 2018-04-12 ENCOUNTER — Encounter: Payer: Self-pay | Admitting: Thoracic Surgery (Cardiothoracic Vascular Surgery)

## 2018-04-12 VITALS — BP 126/78 | HR 82 | Temp 98.3°F | Resp 16 | Ht 68.5 in | Wt 137.6 lb

## 2018-04-12 DIAGNOSIS — J869 Pyothorax without fistula: Secondary | ICD-10-CM

## 2018-04-12 DIAGNOSIS — Z09 Encounter for follow-up examination after completed treatment for conditions other than malignant neoplasm: Secondary | ICD-10-CM

## 2018-04-12 DIAGNOSIS — Z736 Limitation of activities due to disability: Secondary | ICD-10-CM

## 2018-04-12 NOTE — Progress Notes (Signed)
[address]       [place] [address]             [phone]     HPI: Derrick Beltran returns for a follow-up visit  Derrick Beltran is a 57 year old gentleman with a history of tobacco abuse, ethanol abuse, reflux, and medical noncompliance.  I saw Derrick Beltran in the emergency room on 03/03/2018.  He presented with lethargy and hemoptysis.  He also had significant weight loss.  He had a chest x-ray which showed a complex pleural effusion.  I recommended a pigtail catheter placement but he left AGAINST MEDICAL ADVICE.  He presented back about 10 days later with worsening symptoms including back pain, chest soreness and productive cough.  CT showed severe bronchiectasis in the right upper lobe with a complex right pleural effusion.  He had a percutaneous catheter placed by radiology.  He was given thrombolytics through the pleural catheter.  Cultures grew out Prevotella.  He was seen in consultation by infectious disease.  He had amazingly good results and the catheter was removed on 03/22/2018.  He signed out [place] again after the catheter was removed.  He was given a prescription for Augmentin, but says he did not know that and has not picked up the prescription.  He says been feeling well.  He says he still has some back pain on the right.  He denies any shortness of breath. Past Medical History:  Diagnosis Date  . Empyema ([place]) 03/03/2018; 03/14/2018  . GERD (gastroesophageal reflux disease)     Current Outpatient Medications  Medication Sig Dispense Refill  . hydrocortisone cream 1 % Place 1 application rectally as needed for itching.     . naproxen sodium (ALEVE) 220 MG tablet Take 960 mg by mouth daily as needed (pain).      No current facility-administered medications for this visit.     Physical Exam BP 126/78 (BP Location: Left Arm, Patient Position: Sitting, Cuff Size: Small) Comment (Cuff Size): MAUALLY  Pulse 82   Temp 98.3 F (36.8 C)    Resp 16   Ht 5' 8.5" (1.74 m)   Wt 137 lb 9.6 oz (62.4 kg)   SpO2 96% Comment: ON RA  BMI 20.96 kg/m  57 year old man in no acute distress Alert and oriented x3 Thin Diminished breath sounds on right Cardiac regular rate and rhythm  Diagnostic Tests: CHEST - 2 VIEW  COMPARISON:  03/22/2018.  FINDINGS: Heart size stable. Improved right-sided pleural thickening/effusion. Small right base small air collection no longer identified. Changes of bronchiectasis again noted. Left lung is stable. Prominent upper shadows noted. No pneumothorax. No acute bony abnormality.  IMPRESSION: 1. Improved right-sided pleural thickening/effusion. Small right base air collection along identified.  2.  Diffuse changes of right lung bronchiectasis again noted.   Electronically Signed   By: Marcello Moores  Register   On: 04/12/2018 14:25 I personally reviewed the chest x-ray images and concur with the findings noted above  Impression: Derrick Beltran is a 57 year old gentleman with a history of ethanol and tobacco abuse and medical noncompliance.  He has had process going on in the right chest dating back to July.  He presented in early October with worsening symptoms of malaise, poor appetite, weight loss, cough.  He was found to have a essentially a destroyed right upper lobe.  There was a complex effusion.  He signed out AMA.  He presented back 10 days later with worsening symptoms.  At that time a pigtail catheter was placed and he was treated with thrombolytics.  He had a surprisingly good result with that.  Unfortunately he again signed out [place] and did not pick up his prescription for antibiotics.  I advised him that he needs to get his antibiotic prescription filled the day and start taking those.  He needs to follow-up with infectious disease.  My office will help him arrange that appointment.  He needs to return in 2 months with a repeat CT of the chest to follow-up on the  extensive changes in his right chest as well as the left lung nodule noted on his previous CT scan.  He certainly had an infectious component, but we have not ruled out the possibility of malignancy particularly given that he has had 100 pound weight loss over the past year.  Plan: Augmentin as prescribed at discharge Infectious disease Return in 2 months with CT of chest  Melrose Nakayama, MD [facility] [phone]

## 2018-04-13 LAB — FUNGAL ORGANISM REFLEX

## 2018-04-13 LAB — FUNGUS CULTURE WITH STAIN

## 2018-04-13 LAB — FUNGUS CULTURE RESULT

## 2018-04-27 LAB — ACID FAST CULTURE WITH REFLEXED SENSITIVITIES (MYCOBACTERIA): Acid Fast Culture: NEGATIVE

## 2018-04-27 LAB — ACID FAST CULTURE WITH REFLEXED SENSITIVITIES

## 2018-04-29 ENCOUNTER — Other Ambulatory Visit: Payer: Self-pay | Admitting: *Deleted

## 2018-04-29 DIAGNOSIS — R911 Solitary pulmonary nodule: Secondary | ICD-10-CM

## 2018-04-29 LAB — ACID FAST CULTURE WITH REFLEXED SENSITIVITIES: ACID FAST CULTURE - AFSCU3: NEGATIVE

## 2018-04-30 LAB — ACID FAST CULTURE WITH REFLEXED SENSITIVITIES (MYCOBACTERIA): Acid Fast Culture: NEGATIVE

## 2018-05-02 DIAGNOSIS — J850 Gangrene and necrosis of lung: Secondary | ICD-10-CM | POA: Insufficient documentation

## 2018-05-03 ENCOUNTER — Ambulatory Visit: Payer: No Typology Code available for payment source | Admitting: Internal Medicine

## 2018-06-06 ENCOUNTER — Other Ambulatory Visit: Payer: Self-pay | Admitting: Thoracic Surgery (Cardiothoracic Vascular Surgery)

## 2018-06-07 ENCOUNTER — Inpatient Hospital Stay: Admission: RE | Admit: 2018-06-07 | Payer: No Typology Code available for payment source | Source: Ambulatory Visit

## 2018-06-14 ENCOUNTER — Encounter: Payer: No Typology Code available for payment source | Admitting: Thoracic Surgery (Cardiothoracic Vascular Surgery)

## 2018-07-04 ENCOUNTER — Telehealth: Payer: Self-pay

## 2018-07-04 NOTE — Telephone Encounter (Signed)
-----   Message from Melrose Nakayama, MD sent at 07/03/2018  5:17 AM EST ----- I am not treating him with antibiotics without a visit  [facility] ----- Message ----- From: Donnella Sham, RN Sent: 07/01/2018   4:06 PM EST To: Melrose Nakayama, MD  Patient's spouse called stating patient needed a "refill of his antibiotics".  She said his "spitting up mucous" is worse and the antibiotic helped.  I tried to explain that that is not how antibiotics work and he needed to be re-evaluated, I do not believe she understood what I was trying to tell her.  We scheduled a follow-up appointment with you next Tuesday 07/05/2018 (because he no-showed the last one) and advised them that they needed to call to get his CT scheduled with [place], gave them the number but they have yet to call.  I think they are not concerned about the follow-up and just want the medicine.  I did advise that if they were very concerned he should not wait for a follow-up appointment and go to the ED.  Please advise.  Thanks,  Caryl Pina

## 2018-07-05 ENCOUNTER — Ambulatory Visit
Admission: RE | Admit: 2018-07-05 | Discharge: 2018-07-05 | Disposition: A | Discharge status: Home / Self Care | Payer: No Typology Code available for payment source | Source: Ambulatory Visit | Attending: Thoracic Surgery (Cardiothoracic Vascular Surgery) | Admitting: Thoracic Surgery (Cardiothoracic Vascular Surgery)

## 2018-07-05 ENCOUNTER — Encounter: Payer: Self-pay | Admitting: Thoracic Surgery (Cardiothoracic Vascular Surgery)

## 2018-07-05 ENCOUNTER — Other Ambulatory Visit: Payer: Self-pay

## 2018-07-05 ENCOUNTER — Ambulatory Visit (INDEPENDENT_AMBULATORY_CARE_PROVIDER_SITE_OTHER): Payer: No Typology Code available for payment source | Admitting: Thoracic Surgery (Cardiothoracic Vascular Surgery)

## 2018-07-05 VITALS — BP 98/60 | HR 45 | Resp 18 | Ht 68.5 in | Wt 130.2 lb

## 2018-07-05 DIAGNOSIS — R059 Cough, unspecified: Secondary | ICD-10-CM

## 2018-07-05 DIAGNOSIS — R911 Solitary pulmonary nodule: Secondary | ICD-10-CM

## 2018-07-05 DIAGNOSIS — J470 Bronchiectasis with acute lower respiratory infection: Secondary | ICD-10-CM

## 2018-07-05 DIAGNOSIS — R05 Cough: Secondary | ICD-10-CM

## 2018-07-05 MED ORDER — AMOXICILLIN-POT CLAVULANATE 500-125 MG PO TABS: 1.0000 | ORAL_TABLET | Freq: Three times a day (TID) | ORAL | 0 refills | Status: DC

## 2018-07-05 NOTE — Progress Notes (Signed)
[address]       [place] [address]             [phone]     HPI: Derrick Beltran returns for a scheduled follow-up visit.  Derrick Beltran is a 58 year old man with a history of tobacco abuse, ethanol abuse, gastroesophageal reflux, medical noncompliance, bronchiectasis, and recurrent pneumonia.  He was in the emergency room in October 2019 with lethargy and hemoptysis.  He also complained of significant weight loss.  CT showed a complex pleural effusion.  He left AMA.  Came back about 10 days later with worsening symptoms.  CT showed severe bronchiectasis in the right upper lobe with a complex right pleural effusion.  A percutaneous catheter was placed by radiology.  Thrombolytics resulted in near complete resolution of the effusion.  Cultures grew out Prevotella.  He was seen in consultation by infectious disease and treated initially with Unasyn and then went home on Augmentin.  He again signed out AMA after his catheter was removed.  I saw him back in the office on 04/12/2018.  Recommended he return in 2 months with a CT to reevaluate a left lung nodule that was noted on his original CT scan.  I also have my office arrange an appointment with infectious disease, but apparently he did not keep that appointment.  He was feeling well until about a week ago when he started having recurrent cough which was productive.  He has not had any hemoptysis.  He has had fevers, chills, and general malaise.  Past Medical History:  Diagnosis Date  . Empyema ([place]) 03/03/2018; 03/14/2018  . GERD (gastroesophageal reflux disease)      Current Outpatient Medications  Medication Sig Dispense Refill  . hydrocortisone cream 1 % Place 1 application rectally as needed for itching.     . naproxen sodium (ALEVE) 220 MG tablet Take 960 mg by mouth daily as needed (pain).     Marland Kitchen amoxicillin-clavulanate (AUGMENTIN) 500-125 MG tablet Take 1 tablet (500 mg total) by mouth 3 (three) times daily.  21 tablet 0   No current facility-administered medications for this visit.     Physical Exam BP 98/60 (BP Location: Left Arm, Patient Position: Sitting, Cuff Size: Normal)   Pulse (!) 45   Resp 18   Ht 5' 8.5" (1.74 m)   Wt 130 lb 3.2 oz (59.1 kg)   BMI 19.68 kg/m  58 year old man appears much older than stated age Alert and oriented x3, no focal deficits Anxious Lungs diminished breath sounds on right with rhonchi, clear on left Cardiac regular rate and rhythm normal S1 and S2 No cervical or supraclavicular adenopathy  Diagnostic Tests: CT CHEST WITHOUT CONTRAST  TECHNIQUE: Multidetector CT imaging of the chest was performed following the standard protocol without IV contrast.  COMPARISON:  CT chest dated 03/03/2018  FINDINGS: Cardiovascular: The heart is normal in size. No pericardial effusion. Rightward cardiomediastinal shift.  No evidence of thoracic aortic aneurysm. Atherosclerotic calcifications of the aortic arch.  Three vessel coronary atherosclerosis.  Mediastinum/Nodes: Stable small mediastinal lymph nodes, including a dominant 12 mm short axis low right paratracheal node (series 2/image 62), previously 10 mm.  Visualized thyroid is unremarkable.  Lungs/Pleura: Extensive bronchiectasis with volume loss throughout the right hemithorax. Associated right mid/lower lung scarring. New 6.4 x 4.8 cm thick-walled cavitary lesion in the posteromedial right lower lobe (series 5/image 110), likely reflecting focal bronchiectasis with layering fluid.  Moderate right pleural effusion/empyema with nondependent gas,  overall improved.  Prior lingular nodular opacity is improved, with a residual 6 x 4 mm nodule (series 5/image 108), likely post infectious/inflammatory scarring.  However, there is new patchy/nodular opacity with surrounding ground-glass in the posterior left upper lobe (series 5/image 80). Given rapid interval change, as well as the  appearance/distribution, this likely reflects sequela of infection/aspiration.  No pneumothorax.  Upper Abdomen: Visualized upper abdomen is otherwise grossly unremarkable, noting mild vascular calcifications.  Musculoskeletal: Degenerative changes of the visualized thoracolumbar spine.  IMPRESSION: New patchy/nodular opacities in the posterior left upper lobe, favoring infection/aspiration.  Prior lingular nodular opacity has improved, with residual postinfectious/inflammatory scarring.  Extensive chronic bronchiectasis with scarring and volume loss in the right hemithorax, as above. Moderate right empyema with fluid/gas, as above, improved.  Aortic Atherosclerosis (ICD10-I70.0).   Electronically Signed   By: Julian Hy M.D.   On: 07/05/2018 15:10 I personally reviewed the CT images and concur with the findings noted above.  Impression: Derrick Beltran is a 58 year old gentleman with a history of tobacco and ethanol abuse, COPD, recurrent pneumonia, empyema, now with bronchiectasis and aortic atherosclerosis noted on CT.  He has been difficult to manage due to his noncompliance and signing out [place].  He has been released by the family medical service.  His symptoms today are consistent with a new infection.  I suspect he has aspirated again into his left upper lobe.  I am going to treat him with Augmentin 500 mg/125 for a week.   Aspiration is likely the underlying cause for a good deal of his pulmonary issues.  He needs to stop smoking and stop drinking.  I explained that in no uncertain terms.  He needs a speech therapy consult to see if he is aspirating.  I will try to arrange that.   Aortic atherosclerosis-noted on CT.  I am not going to try to start him on any new medications at this point.  I informed him that he needs to find a primary care physician.  I am not going to be managing his day-to-day issues on an ongoing  basis.  Plan: Augmentin 500 mg / 125 mg 1 p.o. 3 times daily for 7 days Follow-up with infectious disease Speech therapy consult to assess for aspiration Return in 1 month with PA and lateral chest x-ray   Melrose Nakayama, MD [facility] [phone]

## 2018-07-06 ENCOUNTER — Encounter: Payer: Self-pay | Admitting: Thoracic Surgery (Cardiothoracic Vascular Surgery)

## 2018-07-11 ENCOUNTER — Ambulatory Visit: Payer: No Typology Code available for payment source | Admitting: Physician Assistant

## 2018-07-11 ENCOUNTER — Telehealth: Payer: Self-pay

## 2018-07-11 NOTE — Telephone Encounter (Signed)
Patient's wife, Derrick Beltran contacted the office and stated that [name] medication that was prescribed was not working, he's still tired and laying around.  She was requesting a medication to help him eat.  He was prescribed an antibiotic for aspiration at his last visit with Dr. Roxan Hockey.  I advised patient's wife that the patient needed to be established with a PCP as Dr. Roxan Hockey was not going to continue to treat his medical problems.  She acknowledged receipt.

## 2018-07-18 ENCOUNTER — Telehealth: Payer: Self-pay

## 2018-07-18 NOTE — Telephone Encounter (Signed)
Patient's wife, Kalman Shan contacted the office requesting pain medication for patient's pain and aches.  I asked if a PCP has been established to which she replied, no.  I advised and gave patient's wife a number of a family medicine practice with [place] in [facility] for her to call to get him an appointment.  I also asked if she returned a phone call to [facility] services for an appointment set up for his Barium Swallow that was ordered at Dr. Leonarda Salon last appointment.  Patient's wife stated she did not get that appointment or message left.  I advised her and gave her the number to contact and set up that appointment.  She acknowledged receipt.

## 2018-08-01 ENCOUNTER — Other Ambulatory Visit: Payer: Self-pay | Admitting: Thoracic Surgery (Cardiothoracic Vascular Surgery)

## 2018-08-01 DIAGNOSIS — J869 Pyothorax without fistula: Secondary | ICD-10-CM

## 2018-08-02 ENCOUNTER — Ambulatory Visit (INDEPENDENT_AMBULATORY_CARE_PROVIDER_SITE_OTHER): Payer: No Typology Code available for payment source | Admitting: Thoracic Surgery (Cardiothoracic Vascular Surgery)

## 2018-08-02 ENCOUNTER — Encounter: Payer: Self-pay | Admitting: Thoracic Surgery (Cardiothoracic Vascular Surgery)

## 2018-08-02 ENCOUNTER — Other Ambulatory Visit: Payer: Self-pay

## 2018-08-02 ENCOUNTER — Ambulatory Visit
Admission: RE | Admit: 2018-08-02 | Discharge: 2018-08-02 | Disposition: A | Discharge status: Home / Self Care | Payer: No Typology Code available for payment source | Source: Ambulatory Visit | Attending: Thoracic Surgery (Cardiothoracic Vascular Surgery) | Admitting: Thoracic Surgery (Cardiothoracic Vascular Surgery)

## 2018-08-02 VITALS — BP 110/80 | HR 71 | Resp 16 | Ht 68.5 in | Wt 138.6 lb

## 2018-08-02 DIAGNOSIS — R911 Solitary pulmonary nodule: Secondary | ICD-10-CM | POA: Diagnosis not present

## 2018-08-02 DIAGNOSIS — J869 Pyothorax without fistula: Secondary | ICD-10-CM

## 2018-08-02 DIAGNOSIS — J470 Bronchiectasis with acute lower respiratory infection: Secondary | ICD-10-CM | POA: Diagnosis not present

## 2018-08-02 NOTE — Progress Notes (Signed)
[address]       [place] [address]             [phone]     HPI: Mr. Bushart returns for follow-up regarding left upper lobe nodule  Derrick Beltran is a 58 year old gentleman with a past medical history of tobacco and ethanol abuse, reflux, medical noncompliance, recurrent pneumonia, and bronchiectasis.  He was in the emergency room in October 2019 with lethargy and hemoptysis.  CT showed a complex pleural effusion.  He left AGAINST MEDICAL ADVICE before he can be treated.  He came back about 10 days later with worsening symptoms.  As CT was essentially unchanged.  He had a percutaneous catheter placed by radiology and was treated with thrombolytics.  He had near complete resolution of the effusion.  Cultures grew out Prevotella and he was treated with Unasyn and then Augmentin.  He again signed out AMA after his catheter was removed.  I saw him back in November 2019.  He did not keep his appointment with infectious disease.  He came back and had a chest x-ray which showed the process was improving.  I saw him in February when he presented with recurrent productive cough.  He denied fevers or chills.  His CT showed a new nodular opacity in the left upper lobe favoring infection/ aspiration.  I treated him with Augmentin.  I also recommended he have a speech therapy evaluation and a barium swallow to evaluate for possible aspiration.  He did not comply with those orders although he did take the antibiotics.  He says that his appetite is improved but he still feels weak and unable to work.  He denies fevers, chills, night sweats.  His cough has improved.  He says he has quit drinking but is not ever going to quit smoking.  Past Medical History:  Diagnosis Date  . Empyema ([place]) 03/03/2018; 03/14/2018  . GERD (gastroesophageal reflux disease)     Current Outpatient Medications  Medication Sig Dispense Refill  . hydrocortisone cream 1 % Place 1 application rectally as  needed for itching.     . naproxen sodium (ALEVE) 220 MG tablet Take 960 mg by mouth daily as needed (pain).      No current facility-administered medications for this visit.     Physical Exam BP 110/80 (BP Location: Right Arm, Patient Position: Sitting, Cuff Size: Normal)   Pulse 71   Resp 16   Ht 5' 8.5" (1.74 m)   Wt 138 lb 9.6 oz (62.9 kg)   SpO2 100% Comment: RA  BMI 20.62 kg/m  58 year old man in no acute distress Alert and oriented with no focal motor deficits Chronically ill-appearing with thenar wasting No cervical supraclavicular adenopathy Lungs markedly diminished on right Cardiac regular rate and rhythm  Diagnostic Tests: CHEST - 2 VIEW  COMPARISON:  Chest x-ray dated 04/12/2018 and chest CT dated 07/05/2018  FINDINGS: There is a focal area of patchy infiltrate in the left upper lobe as demonstrated on the recent CT scan of 07/05/2018 but new since 04/12/2018.  There is further volume loss in the right hemithorax since 04/12/2018. New consolidation at the right lung base medially since the prior chest x-ray.  Chronic loculated empyema at the right lung base, slightly progressed since the prior CT scan. Extensive cystic changes throughout the right lung, most prominent in the right upper and mid zones.  Heart size and vascularity are normal. There is shift of the mediastinal structures to  the right due to the volume loss.  IMPRESSION: Slight progression of empyema and volume loss in the right hemithorax. Severe chronic bronchiectasis. Cavitary lesion at the right lung base medially most likely represents infected bronchiectasis/abscess, as demonstrated on the prior CT scan.   Electronically Signed   By: Lorriane Shire M.D.   On: 08/02/2018 10:30 I personally reviewed the chest x-ray images.  Impression: Tremain Rucinski is a 58 year old gentleman with a long history of medical noncompliance, tobacco and ethanol abuse, probable chronic  aspiration, recurrent pneumonia, and bronchiectasis.  I have been following him since October 2019 when he presented with likely pneumonia and possible empyema.  He signed out AMA at that time but came back 10 days later.  A pigtail drain was placed and he was treated with thrombolytics and had near complete resolution of his effusion.  He grew out Prevotella and was treated with that with antibiotics.  He did not follow-up with his infectious disease consult.  On a repeat CT in February he had a new left upper lobe nodular opacity.  That likely represents aspiration.  It appears relatively unchanged on his chest x-ray today.  He needs another CT in about 2 months to follow that nodule up.  Probable aspiration-he was noncompliant with the barium swallow and speech therapy referral.  Generalized weakness-likely due to his chronic aspiration/ infections but other possibilities exist.  He needs to establish with a primary care physician to have that further evaluated.   Plan: He needs to establish with a primary care physician to deal with general medical problems.  I will see him back in 2 months with a CT of the chest to follow-up the left upper lobe nodular opacity.  Melrose Nakayama, MD [facility] [phone]

## 2018-08-16 ENCOUNTER — Ambulatory Visit: Payer: No Typology Code available for payment source | Admitting: Infectious Disease

## 2018-08-22 ENCOUNTER — Other Ambulatory Visit: Payer: Self-pay | Admitting: Thoracic Surgery (Cardiothoracic Vascular Surgery)

## 2018-08-22 DIAGNOSIS — R911 Solitary pulmonary nodule: Secondary | ICD-10-CM

## 2018-09-08 ENCOUNTER — Telehealth: Payer: Self-pay

## 2018-09-08 NOTE — Telephone Encounter (Signed)
ID call to report that Derrick Beltran canceled appointment/via auto system  ID has not rescheduled appt due to not being able to reach him to reschedule

## 2018-09-23 DIAGNOSIS — Z736 Limitation of activities due to disability: Secondary | ICD-10-CM

## 2018-10-10 ENCOUNTER — Other Ambulatory Visit: Payer: No Typology Code available for payment source

## 2018-10-11 ENCOUNTER — Encounter: Payer: No Typology Code available for payment source | Admitting: Thoracic Surgery (Cardiothoracic Vascular Surgery)

## 2020-02-26 ENCOUNTER — Other Ambulatory Visit (HOSPITAL_COMMUNITY): Payer: Self-pay | Admitting: Family Medicine

## 2020-02-26 DIAGNOSIS — R051 Acute cough: Secondary | ICD-10-CM

## 2020-02-27 ENCOUNTER — Other Ambulatory Visit: Payer: Self-pay

## 2020-02-27 ENCOUNTER — Ambulatory Visit (HOSPITAL_COMMUNITY)
Admission: RE | Admit: 2020-02-27 | Discharge: 2020-02-27 | Disposition: A | Discharge status: Home / Self Care | Payer: Self-pay | Source: Ambulatory Visit | Attending: Family Medicine | Admitting: Family Medicine

## 2020-02-27 DIAGNOSIS — R051 Acute cough: Secondary | ICD-10-CM | POA: Insufficient documentation

## 2020-02-27 IMAGING — DX DG CHEST 2V
2 series · 2 of 2 positions shown · non-contrast
Comparison: Chest x-ray dated [DATE]. CT chest dated
[DATE].

CLINICAL DATA: Productive cough and shortness of breath.

EXAM:
CHEST - 2 VIEW

[chest pa]
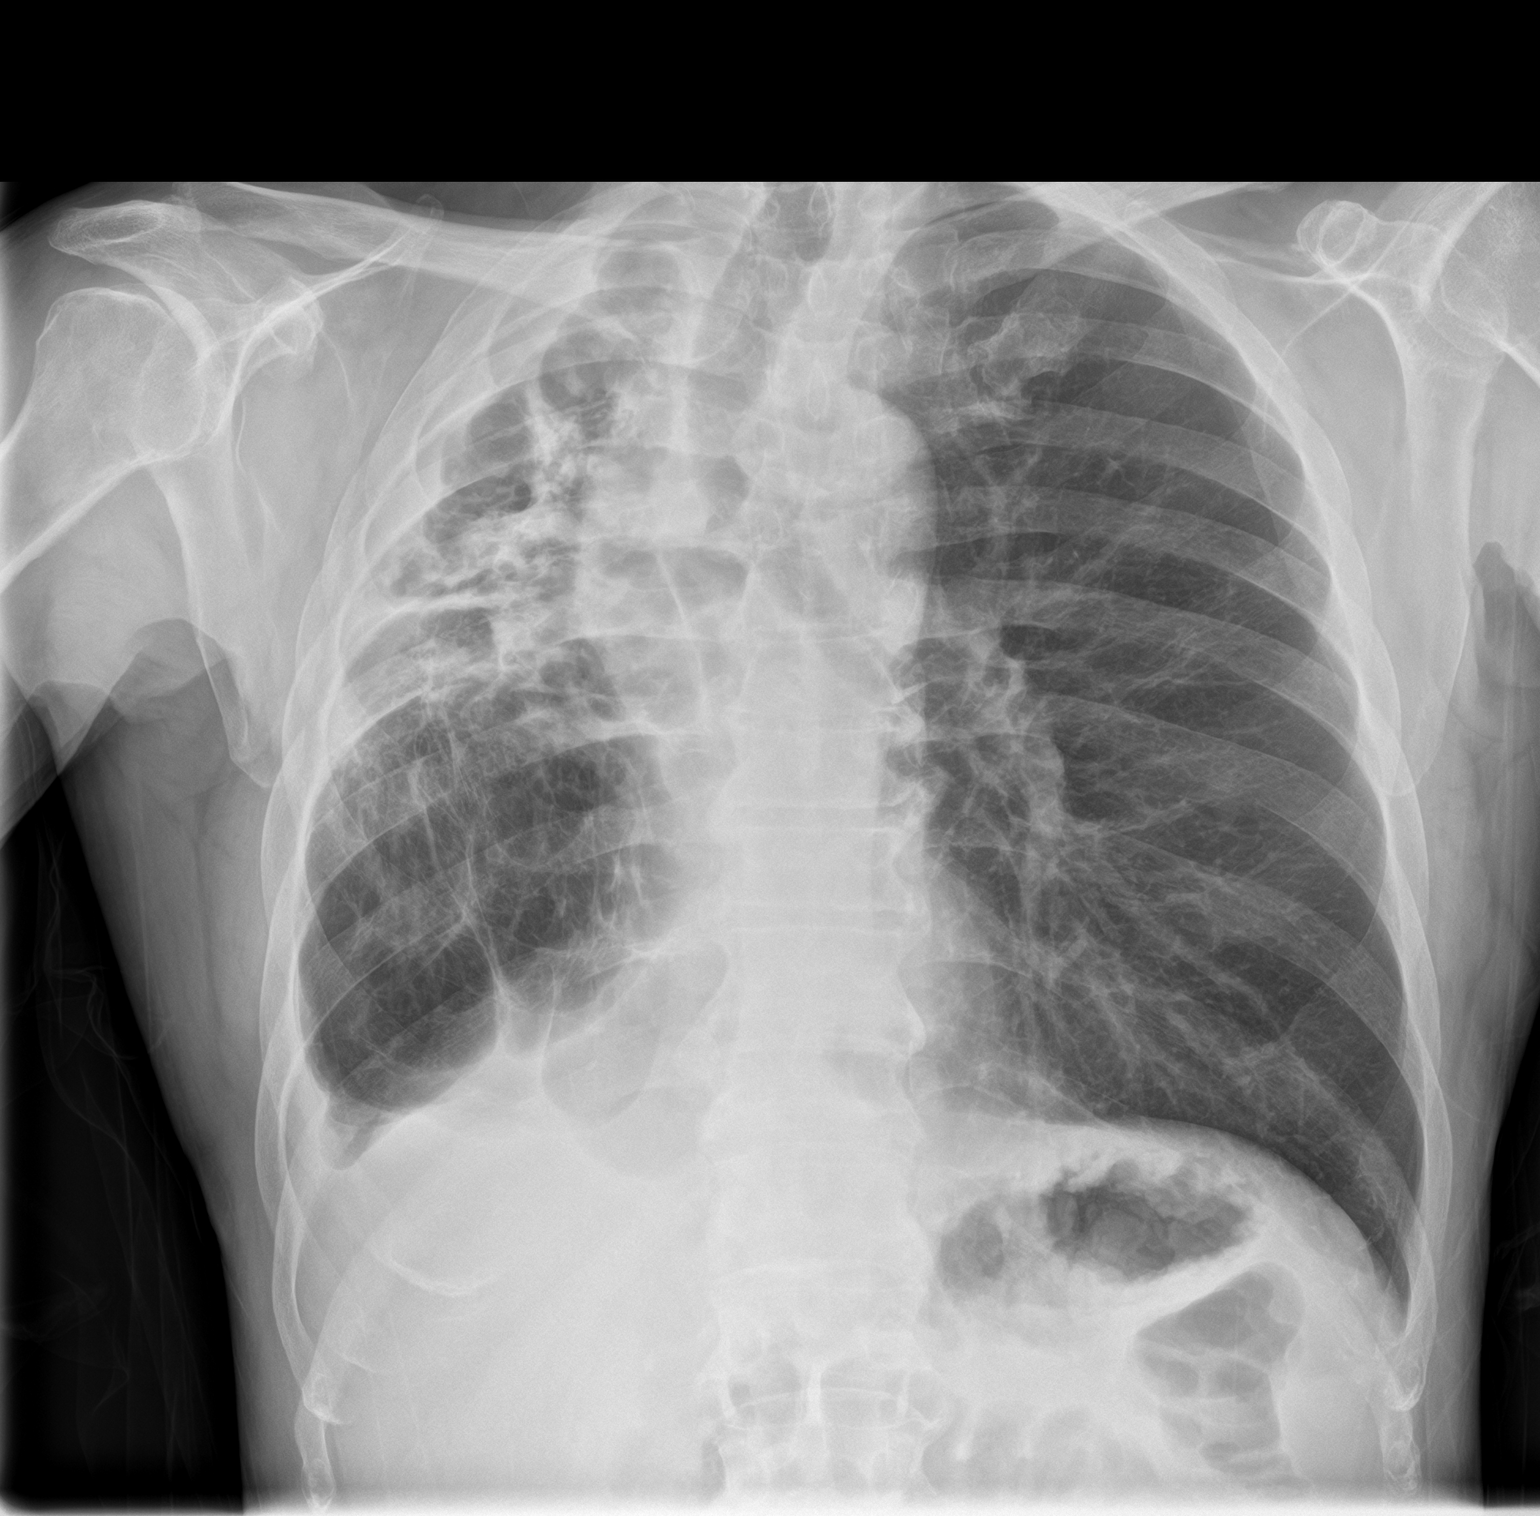

[chest lat]
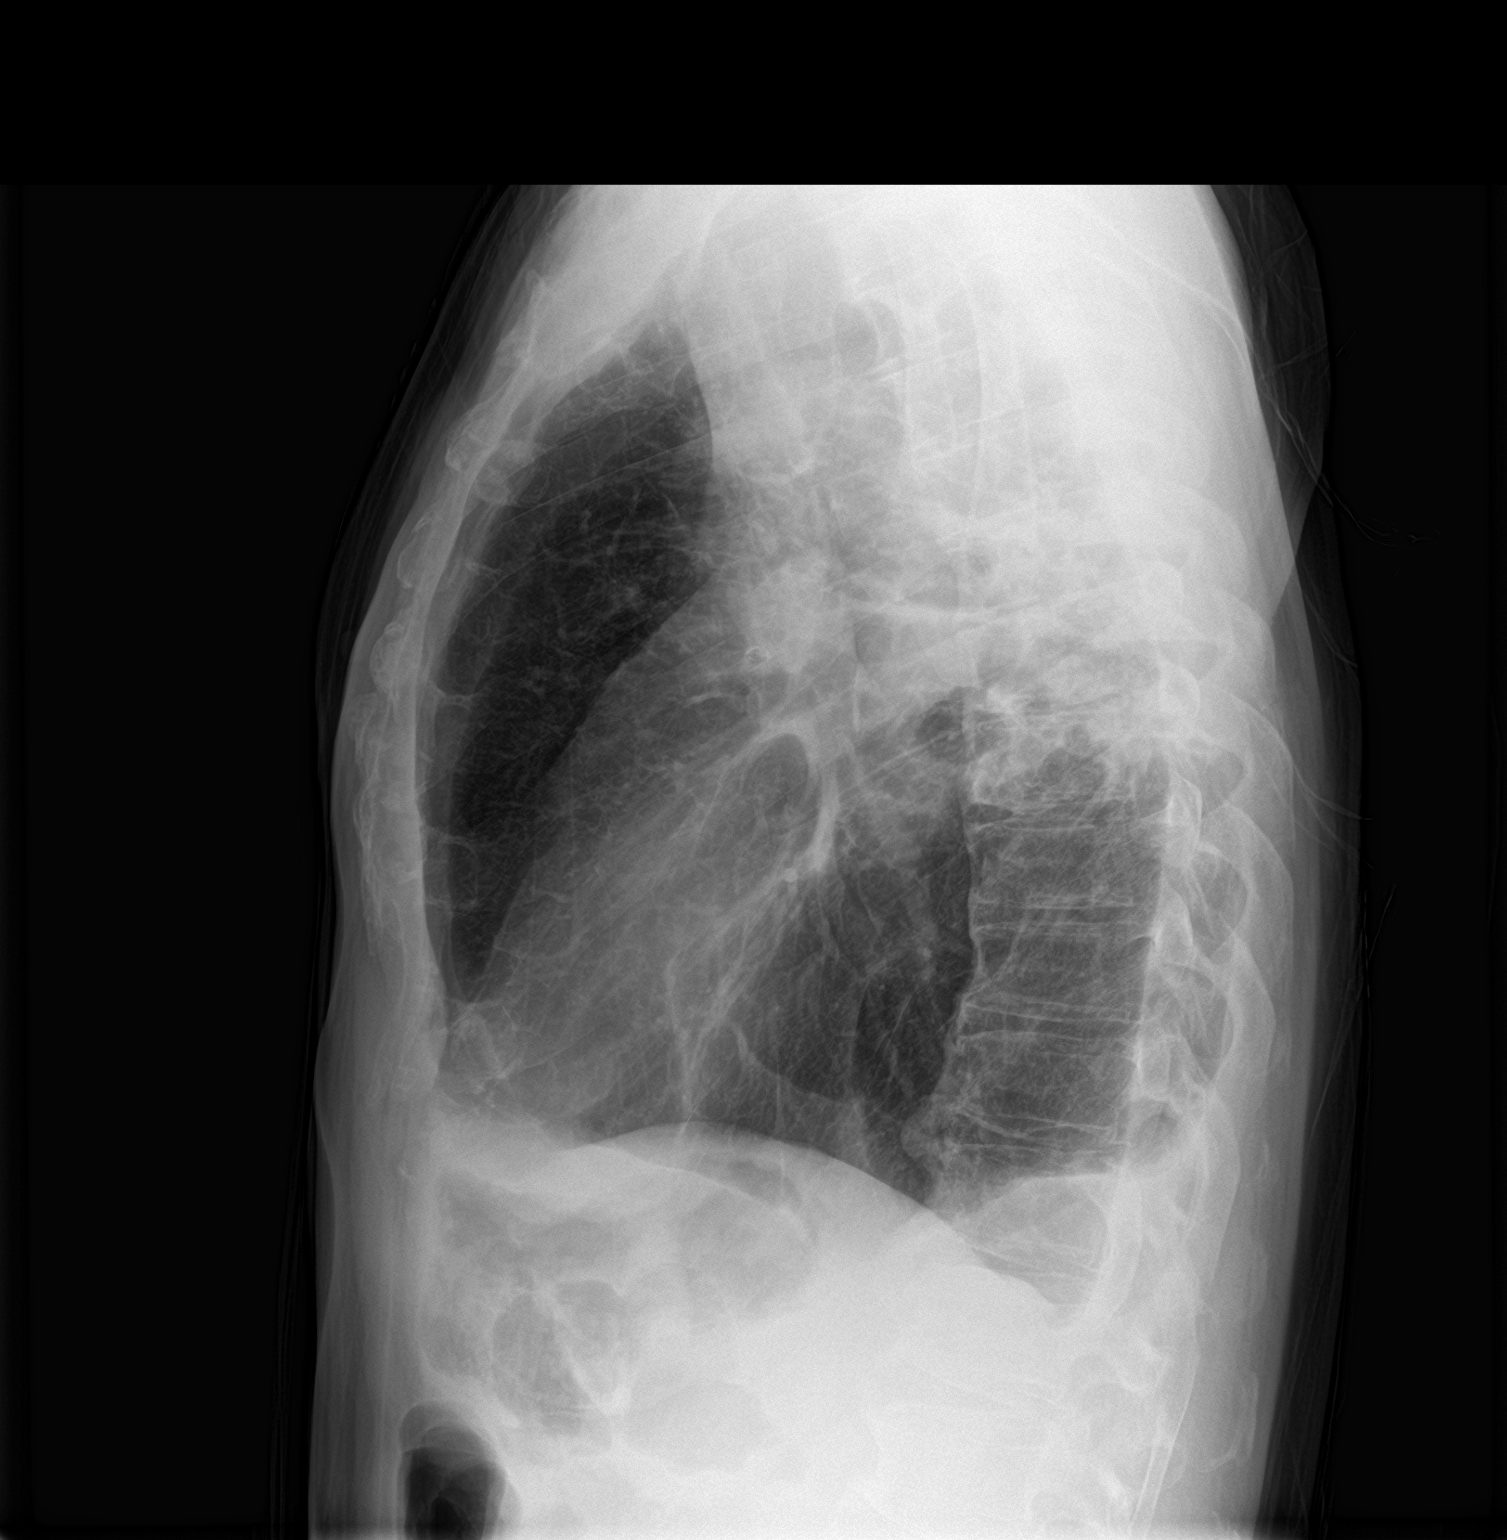

[2 of 2 positions shown; findings below may reference images not displayed]

FINDINGS: Normal heart size. Normal pulmonary vascularity. Extensive cystic
changes and volume loss in the right lung again noted with increased
superimposed patchy density in the right upper lobe. Decreased now
small chronic loculated right pleural effusion. The left lung is
clear. No pneumothorax. No acute osseous abnormality.
IMPRESSION: 1. Chronic changes in the right lung with new patchy density in the
right upper lobe, concerning for superimposed pneumonia.
2. Decreased chronic loculated right pleural effusion.

## 2020-03-11 ENCOUNTER — Emergency Department (HOSPITAL_COMMUNITY): Payer: No Typology Code available for payment source

## 2020-03-11 ENCOUNTER — Encounter (HOSPITAL_COMMUNITY): Payer: Self-pay | Admitting: *Deleted

## 2020-03-11 ENCOUNTER — Other Ambulatory Visit: Payer: Self-pay

## 2020-03-11 ENCOUNTER — Emergency Department (HOSPITAL_COMMUNITY)
Admission: EM | Admit: 2020-03-11 | Discharge: 2020-03-11 | Disposition: A | Discharge status: Home / Self Care | Payer: No Typology Code available for payment source | Attending: Emergency Medicine | Admitting: Emergency Medicine

## 2020-03-11 DIAGNOSIS — R197 Diarrhea, unspecified: Secondary | ICD-10-CM | POA: Diagnosis not present

## 2020-03-11 DIAGNOSIS — I700 Atherosclerosis of aorta: Secondary | ICD-10-CM | POA: Insufficient documentation

## 2020-03-11 DIAGNOSIS — J439 Emphysema, unspecified: Secondary | ICD-10-CM | POA: Diagnosis not present

## 2020-03-11 DIAGNOSIS — R45851 Suicidal ideations: Secondary | ICD-10-CM | POA: Insufficient documentation

## 2020-03-11 DIAGNOSIS — E871 Hypo-osmolality and hyponatremia: Secondary | ICD-10-CM

## 2020-03-11 DIAGNOSIS — N400 Enlarged prostate without lower urinary tract symptoms: Secondary | ICD-10-CM | POA: Diagnosis not present

## 2020-03-11 DIAGNOSIS — Z20822 Contact with and (suspected) exposure to covid-19: Secondary | ICD-10-CM | POA: Diagnosis not present

## 2020-03-11 DIAGNOSIS — I2510 Atherosclerotic heart disease of native coronary artery without angina pectoris: Secondary | ICD-10-CM | POA: Diagnosis not present

## 2020-03-11 DIAGNOSIS — F17210 Nicotine dependence, cigarettes, uncomplicated: Secondary | ICD-10-CM | POA: Insufficient documentation

## 2020-03-11 DIAGNOSIS — R918 Other nonspecific abnormal finding of lung field: Secondary | ICD-10-CM | POA: Diagnosis not present

## 2020-03-11 DIAGNOSIS — R059 Cough, unspecified: Secondary | ICD-10-CM | POA: Insufficient documentation

## 2020-03-11 DIAGNOSIS — R531 Weakness: Secondary | ICD-10-CM | POA: Insufficient documentation

## 2020-03-11 DIAGNOSIS — F329 Major depressive disorder, single episode, unspecified: Secondary | ICD-10-CM | POA: Insufficient documentation

## 2020-03-11 LAB — COMPREHENSIVE METABOLIC PANEL
ALT: 16 U/L (ref 0–44)
AST: 19 U/L (ref 15–41)
Albumin: 2.9 g/dL — ABNORMAL LOW (ref 3.5–5.0)
Alkaline Phosphatase: 62 U/L (ref 38–126)
Anion gap: 10 (ref 5–15)
BUN: 13 mg/dL (ref 6–20)
CO2: 25 mmol/L (ref 22–32)
Calcium: 9 mg/dL (ref 8.9–10.3)
Chloride: 88 mmol/L — ABNORMAL LOW (ref 98–111)
Creatinine, Ser: 1.2 mg/dL (ref 0.61–1.24)
GFR, Estimated: >60 mL/min (ref >60)
Glucose, Bld: 113 mg/dL — ABNORMAL HIGH (ref 70–99)
Potassium: 4.3 mmol/L (ref 3.5–5.1)
Sodium: 123 mmol/L — ABNORMAL LOW (ref 135–145)
Total Bilirubin: 0.5 mg/dL (ref 0.3–1.2)
Total Protein: 8.4 g/dL — ABNORMAL HIGH (ref 6.5–8.1)

## 2020-03-11 LAB — CBC WITH DIFFERENTIAL/PLATELET
Abs Immature Granulocytes: 0.06 10*3/uL (ref 0.00–0.07)
Basophils Absolute: 0.1 10*3/uL (ref 0.0–0.1)
Basophils Relative: 1 %
Eosinophils Absolute: 0.2 10*3/uL (ref 0.0–0.5)
Eosinophils Relative: 3 %
HCT: 42.8 % (ref 39.0–52.0)
Hemoglobin: 14.5 g/dL (ref 13.0–17.0)
Immature Granulocytes: 1 %
Lymphocytes Relative: 9 %
Lymphs Abs: 0.8 10*3/uL (ref 0.7–4.0)
MCH: 30.7 pg (ref 26.0–34.0)
MCHC: 33.9 g/dL (ref 30.0–36.0)
MCV: 90.5 fL (ref 80.0–100.0)
Monocytes Absolute: 0.9 10*3/uL (ref 0.1–1.0)
Monocytes Relative: 11 %
Neutro Abs: 6.3 10*3/uL (ref 1.7–7.7)
Neutrophils Relative %: 75 %
Platelets: 314 10*3/uL (ref 150–400)
RBC: 4.73 MIL/uL (ref 4.22–5.81)
RDW: 12 % (ref 11.5–15.5)
WBC: 8.4 10*3/uL (ref 4.0–10.5)
nRBC: 0 % (ref 0.0–0.2)

## 2020-03-11 LAB — URINALYSIS, ROUTINE W REFLEX MICROSCOPIC
Bilirubin Urine: NEGATIVE
Glucose, UA: NEGATIVE mg/dL
Ketones, ur: 5 mg/dL — AB
Leukocytes,Ua: NEGATIVE
Nitrite: NEGATIVE
Protein, ur: NEGATIVE mg/dL
Specific Gravity, Urine: 1.038 — ABNORMAL HIGH (ref 1.005–1.030)
pH: 5 (ref 5.0–8.0)

## 2020-03-11 LAB — RAPID URINE DRUG SCREEN, HOSP PERFORMED
Amphetamines: NOT DETECTED
Barbiturates: NOT DETECTED
Benzodiazepines: NOT DETECTED
Cocaine: NOT DETECTED
Opiates: NOT DETECTED
Tetrahydrocannabinol: NOT DETECTED

## 2020-03-11 LAB — RESP PANEL BY RT PCR (RSV, FLU A&B, COVID)
Influenza A by PCR: NEGATIVE
Influenza B by PCR: NEGATIVE
Respiratory Syncytial Virus by PCR: NEGATIVE
SARS Coronavirus 2 by RT PCR: NEGATIVE

## 2020-03-11 LAB — ETHANOL: Alcohol, Ethyl (B): <10 mg/dL (ref <10)

## 2020-03-11 LAB — ACETAMINOPHEN LEVEL: Acetaminophen (Tylenol), Serum: <10 ug/mL — ABNORMAL LOW (ref 10–30)

## 2020-03-11 LAB — SALICYLATE LEVEL: Salicylate Lvl: <7 mg/dL — ABNORMAL LOW (ref 7.0–30.0)

## 2020-03-11 LAB — LIPASE, BLOOD: Lipase: 41 U/L (ref 11–51)

## 2020-03-11 MED ORDER — IOHEXOL 300 MG/ML  SOLN
100.0000 mL | Freq: Once | INTRAMUSCULAR | Status: AC | PRN
  Administered 2020-03-11: 100 mL via INTRAVENOUS

## 2020-03-11 MED ORDER — SODIUM CHLORIDE 0.9 % IV BOLUS: 1000.0000 mL | Freq: Once | INTRAVENOUS | Status: DC

## 2020-03-11 NOTE — ED Triage Notes (Addendum)
Pt c/o weakness, decreased appetite, productive cough-brown in color, chills, stool incontinency x 3 weeks. Denies SOB, fever. Pt reports he has been tested for Covid 1 week ago at [facility] and was negative. Pt was told by his PCP to come to the ED for further evaluation.   Pt reports during triage that he has been having thoughts of suicide over the past couple of weeks. He states that he knows he wouldn't do anything, but has been feeling that way. Pt states, "I feel like I have the devil on one side telling me to do it and God on the other telling me not to". Pt denies any plans or intent to act upon the thoughts.

## 2020-03-11 NOTE — Discharge Instructions (Addendum)
Take imodium as directed over the counter for diarrhea Do not drink beer or alcohol until your diarrhea resolves and your sodium is corrected. Get help right away if: You have chest pain. You feel extremely weak or you faint. You have bloody or black stools or stools that look like tar. You have severe pain, cramping, or bloating in your abdomen. You have trouble breathing or you are breathing very quickly. Your heart is beating very quickly. Your skin feels cold and clammy. You feel confused. You have signs of dehydration, such as: Dark urine, very little urine, or no urine. Cracked lips. Dry mouth. Sunken eyes. Sleepiness. Weakness. Get help right away if: You are talking about suicide or wishing to die. You start making plans for how to commit suicide. You feel that you have no reason to live. You start making plans for putting your affairs in order, saying goodbye, or giving your possessions away. You feel guilt, shame, or unbearable pain, and it seems like there is no way out. You are frequently using drugs or alcohol. You are engaging in risky behaviors that could lead to death.

## 2020-03-11 NOTE — ED Notes (Signed)
Pt aware we need urine sample, urinal at bedside.  

## 2020-03-11 NOTE — ED Provider Notes (Addendum)
[facility] EMERGENCY DEPARTMENT Provider Note   CSN: [id number] Arrival date & time: 03/11/20  1217     History Chief Complaint  Patient presents with  . Weakness  . Suicidal    Derrick Beltran is a 59 y.o. male with a past medical history of GERD who presents emergency department because he was "sent in by my doctor."  States that he was diagnosed with "a touch of pneumonia" recently.  He was prescribed an antibiotic but he is not sure what he took.  He has completed the course.  He states that since then he has had persistent coughing, weakness, tenesmus with production of minimal watery stools.  He states that at times he loses control of his bowel continence.  He is a daily smoker and he also drinks daily "2-5 beers to relax me after work."  He has not been running fever.  The patient also complains that he is been depressed.  He states "I think the sickness might have some to do with it."  He states that he is also raising his 75 year old grandson and his relationship with his wife of 40 years is somewhat strained.  He is feeling depressed and thinks about suicide but has no active plan.  HPI     Past Medical History:  Diagnosis Date  . Empyema ([place]) 03/03/2018; 03/14/2018  . GERD (gastroesophageal reflux disease)     Patient Active Problem List   Diagnosis Date Noted  . 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 Malnutrition of moderate degree 03/17/2018  . Pulmonary nodule 03/04/2018  . Aortic atherosclerosis ([place]) 03/04/2018  . Tobacco use 03/04/2018  . Pleural empyema ([place]) 03/03/2018    Past Surgical History:  Procedure Laterality Date  . NO PAST SURGERIES         Family History  Problem Relation Age of Onset  . Alzheimer's disease Mother   . Alzheimer's disease Father     Social History   Tobacco Use  . Smoking status: Current Every Day Smoker    Packs/day: 1.00    Years: 45.00    Pack years: 45.00    Types: Cigarettes  . Smokeless tobacco: Never Used   Vaping Use  . Vaping Use: Never used  Substance Use Topics  . Alcohol use: Yes    Alcohol/week: 30.0 standard drinks    Types: 30 Cans of beer per week    Comment: 03/03/2018 "3-4 / day and more on weekends "  . Drug use: Never    Home Medications Prior to Admission medications   Medication Sig Start Date End Date Taking? Authorizing Provider  hydrocortisone cream 1 % Place 1 application rectally as needed for itching.  02/21/18   [provider]  naproxen sodium (ALEVE) 220 MG tablet Take 960 mg by mouth daily as needed (pain).     [provider]    Allergies    Patient has no known allergies.  Review of Systems   Review of Systems Ten systems reviewed and are negative for acute change, except as noted in the HPI.   Physical Exam Updated Vital Signs BP 135/89 (BP Location: Right Arm)   Pulse 79   Temp 97.6 F (36.4 C) (Oral)   Resp 18   Ht 5\' 9"  (1.753 m)   Wt 64.6 kg   SpO2 97%   BMI 21.03 kg/m   Physical Exam Vitals and nursing note reviewed.  Constitutional:      General: He is not in acute distress.  Appearance: He is well-developed. He is not diaphoretic.  HENT:     Head: Normocephalic and atraumatic.  Eyes:     General: No scleral icterus.    Conjunctiva/sclera: Conjunctivae normal.  Cardiovascular:     Rate and Rhythm: Normal rate and regular rhythm.     Heart sounds: Normal heart sounds.  Pulmonary:     Effort: Pulmonary effort is normal. No respiratory distress.     Breath sounds: Normal breath sounds.  Chest:     Chest wall: No tenderness.  Abdominal:     Palpations: Abdomen is soft.     Tenderness: There is no abdominal tenderness.  Musculoskeletal:     Cervical back: Normal range of motion and neck supple.  Skin:    General: Skin is warm and dry.  Neurological:     Mental Status: He is alert.  Psychiatric:        Attention and Perception: Attention normal.        Mood and Affect: Mood is depressed. Affect is flat.      ED Results / Procedures / Treatments   Labs (all labs ordered are listed, but only abnormal results are displayed) Labs Reviewed - No data to display  EKG None  Radiology No results found.  Procedures Procedures (including critical care time)  Medications Ordered in ED Medications - No data to display  ED Course  I have reviewed the triage vital signs and the nursing notes.  Pertinent labs & imaging results that were available during my care of the patient were reviewed by me and considered in my medical decision making (see chart for details).  Clinical Course as of Mar 11 1745  Mon Mar 11, 2020  1527 Sodium(!): 123 [AH]    Clinical Course User Index [AH] Margarita Mail, PA-C   MDM Rules/Calculators/A&P                          .Patient here with complaint of diarrhea, cough, depression and suicidal ideation.The differential diagnosis of diarrhea includes but is not limited to Viral- norovirus/rotavirus; Bacterial-Campylobacter,Shigella, Salmonella, Escherichia coli, E. coli 0157:H7, Yersinia enterocolitica, Vibrio cholerae, Clostridium difficile. Parasitic- Giardia lamblia, Cryptosporidium,Entamoeba histolytica,Cyclospora, Microsporidium. Toxin- Staphylococcus aureus, Bacillus cereus. Noninfectious causes include GI Bleed, Appendicitis, Mesenteric Ischemia, Diverticulitis, Adrenal Crisis, Thyroid Storm, Toxicologic exposures, Antibiotic or drug-associated, inflammatory bowel disease.  Patient has had no episodes of diarrhea here in the emergency department. I ordered reviewed and interpreted labs which included a CBC which is without abnormality, CMP shows sodium of 123 however patient is asymptomatic.  Patient does not have any symptoms of UTI although he does have a large amount of hemoglobin this was sent for culture Covid panel negative, I UDS within normal limits.  Salicylate and Tylenol level within normal limits.  Alcohol level negative and lipase within normal  limits I ordered and reviewed images including portable 1 view chest x-ray, CT abdomen pelvis and CT chest with contrast.  There are no acute findings on today's imaging.  The recent area of pneumonia appears to be improving within the CT chest.  KG shows sinus rhythm at a rate of 59 without abnormality. Patient became agitated states that he is a cigarette smoker and needs to go outside to smoke and does not wish to stay any further.  Had a long discussion with the patient about all of his lab findings.  Discussed that his hyponatremia may be to loss from diarrhea, poor intake, or his 4-5  beers nightly.  Of advised the patient that he should discontinue using alcohol and follow-up with his primary care physician in 1 week for recheck of his labs. Patient states that he does not have an active plan of suicide.  He states "I believe in [place], I am going to heaven, and I would not hurt myself."  He also states that he would not hurt anyone else.  Patient declines outpatient resources for psychiatric help and does not wish to stay any further.  I believe he is extremely low risk for completed suicide and feel that he is safe for discharge at this time. Final Clinical Impression(s) / ED Diagnoses Final diagnoses:  None    Rx / DC Orders ED Discharge Orders    None       Margarita Mail, PA-C 03/11/20 1753    Elnora Morrison, MD 03/14/20 4103    Elnora Morrison, MD 03/14/20 0407    Margarita Mail, PA-C 04/08/20 0131    Elnora Morrison, MD 04/08/20 2332

## 2020-04-04 ENCOUNTER — Encounter: Payer: Self-pay | Admitting: Internal Medicine

## 2020-05-22 ENCOUNTER — Other Ambulatory Visit: Payer: Self-pay

## 2020-05-22 ENCOUNTER — Telehealth: Payer: Self-pay | Admitting: *Deleted

## 2020-05-22 ENCOUNTER — Encounter: Payer: Self-pay | Admitting: *Deleted

## 2020-05-22 ENCOUNTER — Ambulatory Visit (INDEPENDENT_AMBULATORY_CARE_PROVIDER_SITE_OTHER): Payer: PRIVATE HEALTH INSURANCE | Admitting: Internal Medicine

## 2020-05-22 ENCOUNTER — Encounter: Payer: Self-pay | Admitting: Internal Medicine

## 2020-05-22 VITALS — BP 86/64 | HR 75 | Temp 96.6°F | Ht 69.0 in | Wt 135.6 lb

## 2020-05-22 DIAGNOSIS — R053 Chronic cough: Secondary | ICD-10-CM | POA: Diagnosis not present

## 2020-05-22 DIAGNOSIS — Z1211 Encounter for screening for malignant neoplasm of colon: Secondary | ICD-10-CM | POA: Diagnosis not present

## 2020-05-22 DIAGNOSIS — K219 Gastro-esophageal reflux disease without esophagitis: Secondary | ICD-10-CM

## 2020-05-22 DIAGNOSIS — R634 Abnormal weight loss: Secondary | ICD-10-CM | POA: Diagnosis not present

## 2020-05-22 NOTE — Telephone Encounter (Signed)
Called pt and is aware of pre-op/covid test appt details. 

## 2020-05-22 NOTE — Telephone Encounter (Signed)
Called [facility] and spoke with [employer]. No PA is required for EGD

## 2020-05-22 NOTE — Progress Notes (Signed)
Primary Care Physician:  Marylynn Pearson, FNP Primary Gastroenterologist:  Dr. Marletta Lor  Chief Complaint  Patient presents with   Diarrhea    Mostly occurs after eating, daily    HPI:   Derrick Beltran is a 59 y.o. male who presents to the clinic today by referral from his PCP Marylynn Pearson for evaluation.  Patient has multiple GI complaints for me today.  He states he has chronic coughing as well as mucus production.  There was can some concern for aspiration.  He had a CT scan of his chest abdomen pelvis which showed some pulmonary findings but appeared relatively unremarkable from a GI standpoint.  Denies any dysphagia or odynophagia.  Does have chronic acid reflux for which he takes as needed omeprazole.  No chronic NSAID use.  Does note being a pack-a-day smoker for 45 years.  No abdominal pain.  No previous colon cancer screening modality of any kind including colonoscopy.  Denies family history of colon cancer.  Does note unintentional weight loss.  Past Medical History:  Diagnosis Date   Empyema (HCC) 03/03/2018; 03/14/2018   GERD (gastroesophageal reflux disease)     Past Surgical History:  Procedure Laterality Date   NO PAST SURGERIES      Current Outpatient Medications  Medication Sig Dispense Refill   Hydrocortisone, Perianal, 1 % CREA Apply 1 application topically as needed.     naproxen sodium (ALEVE) 220 MG tablet Take 960 mg by mouth daily as needed (pain).     sodium chloride 1 g tablet Take 1 tablet by mouth at bedtime.     traZODone (DESYREL) 50 MG tablet Take 100 mg by mouth at bedtime.     amoxicillin-clavulanate (AUGMENTIN) 500-125 MG tablet Take 1 tablet by mouth 2 (two) times daily. (Patient not taking: Reported on 05/22/2020)     UNABLE TO FIND Take 1 Units by mouth as needed. Med Name: pt endorses taking an acid reflux pill but is not sure which one, its an OTC (Patient not taking: Reported on 05/22/2020)     No current facility-administered  medications for this visit.    Allergies as of 05/22/2020   (No Known Allergies)    Family History  Problem Relation Age of Onset   Alzheimer's disease Mother    Alzheimer's disease Father     Social History   Socioeconomic History   Marital status: Married    Spouse name: Not on file   Number of children: Not on file   Years of education: Not on file   Highest education level: Not on file  Occupational History   Not on file  Tobacco Use   Smoking status: Current Every Day Smoker    Packs/day: 1.00    Years: 45.00    Pack years: 45.00    Types: Cigarettes   Smokeless tobacco: Never Used  Vaping Use   Vaping Use: Never used  Substance and Sexual Activity   Alcohol use: Yes    Comment: 03/03/2018 "3-4 / day and more on weekends "' 05/22/20 avg 1-2 daily   Drug use: Never   Sexual activity: Not Currently  Other Topics Concern   Not on file  Social History Narrative   Not on file   Social Determinants of Health   Financial Resource Strain: Not on file  Food Insecurity: Not on file  Transportation Needs: Not on file  Physical Activity: Not on file  Stress: Not on file  Social Connections: Not on file  Intimate Partner  Violence: Not on file    Subjective: Review of Systems  Constitutional: Negative for chills and fever.  HENT: Negative for congestion and hearing loss.   Eyes: Negative for blurred vision and double vision.  Respiratory: Positive for cough. Negative for shortness of breath.        Chronic cough  Cardiovascular: Negative for chest pain and palpitations.  Gastrointestinal: Negative for abdominal pain, blood in stool, constipation, diarrhea, heartburn, melena and vomiting.  Genitourinary: Negative for dysuria and urgency.  Musculoskeletal: Negative for joint pain and myalgias.  Skin: Negative for itching and rash.  Neurological: Negative for dizziness and headaches.  Psychiatric/Behavioral: Negative for depression. The patient is  not nervous/anxious.        Objective: BP (!) 86/64    Pulse 75    Temp (!) 96.6 F (35.9 C) (Temporal)    Ht 5\' 9"  (1.753 m)    Wt 135 lb 9.6 oz (61.5 kg)    BMI 20.02 kg/m  Physical Exam Constitutional:      Appearance: Normal appearance.  HENT:     Head: Normocephalic and atraumatic.  Eyes:     Extraocular Movements: Extraocular movements intact.     Conjunctiva/sclera: Conjunctivae normal.  Cardiovascular:     Rate and Rhythm: Normal rate and regular rhythm.  Pulmonary:     Effort: Pulmonary effort is normal.     Breath sounds: Normal breath sounds.  Abdominal:     General: Bowel sounds are normal.     Palpations: Abdomen is soft.  Musculoskeletal:        General: Normal range of motion.     Cervical back: Normal range of motion and neck supple.  Skin:    General: Skin is warm.  Neurological:     General: No focal deficit present.     Mental Status: He is alert and oriented to person, place, and time.  Psychiatric:        Mood and Affect: Mood normal.        Behavior: Behavior normal.      Assessment: *Chronic cough *Weight loss *Acid reflux *Colon cancer screening  Plan: Discussed chronic cough in depth with patient today including most common etiologies such as upper airway cough syndrome, chronic bronchitis, and chronic reflux.  Will schedule for EGD to evaluate for peptic ulcer disease, esophagitis, gastritis, H. Pylori, duodenitis, or other. Will also evaluate for esophageal stricture, Schatzki's ring, esophageal web or other.   The risks including infection, bleed, or perforation as well as benefits, limitations, alternatives and imponderables have been reviewed with the patient. Potential for esophageal dilation, biopsy, etc. have also been reviewed.  Questions have been answered. All parties agreeable.  Recommended patient undergo screening colonoscopy at the same time as he has never had any colon cancer screening modality today.  He would like to  hold off on this for now.  We will continue to recommend that he have this performed.  We will consider PPI therapy pending endoscopic evaluation.  Thank you for the kind referral.  05/22/2020 9:23 AM   Disclaimer: This note was dictated with voice recognition software. Similar sounding words can inadvertently be transcribed and may not be corrected upon review.

## 2020-05-22 NOTE — Patient Instructions (Signed)
We will schedule you for upper endoscopy to further evaluate your chronic cough and mucus.  Depending on results we may start you on a medication for acid reflux.  Further recommendations to follow.  At [facility] we value your feedback. You may receive a survey about your visit today. Please share your experience as we strive to create trusting relationships with our patients to provide genuine, compassionate, quality care.  We appreciate your understanding and patience as we review any laboratory studies, imaging, and other diagnostic tests that are ordered as we care for you. Our office policy is 5 business days for review of these results, and any emergent or urgent results are addressed in a timely manner for your best interest. If you do not hear from our office in 1 week, please contact us.   We also encourage the use of MyChart, which contains your medical information for your review as well. If you are not enrolled in this feature, an access code is on this after visit summary for your convenience. Thank you for allowing [place] to be involved in your care.  It was great to see you today!  I hope you have a great rest of your winter!!    Hennie Duos. Marletta Lor, D.O. Gastroenterology and Hepatology [facility]

## 2020-05-23 NOTE — Patient Instructions (Signed)
20    Your procedure is scheduled on: 05/28/2020  Report to [facility] at   9:30  AM.  Call this number if you have problems the morning of surgery: [phone]   Remember:   Follow instructions on letter from office regarding when to stop eating and drinking        No Smoking the day of procedure      Take these medicines the morning of surgery with A SIP OF WATER: none   Do not wear jewelry, make-up or nail polish.  Do not wear lotions, powders, or perfumes. You may wear deodorant.                Do not bring valuables to the hospital.  Contacts, dentures or bridgework may not be worn into surgery.  Leave suitcase in the car. After surgery it may be brought to your room.  For patients admitted to the hospital, checkout time is 11:00 AM the day of discharge.   Patients discharged the day of surgery will not be allowed to drive home. Upper Endoscopy, Adult Upper endoscopy is a procedure to look inside the upper GI (gastrointestinal) tract. The upper GI tract is made up of:  The part of the body that moves food from your mouth to your stomach (esophagus).  The stomach.  The first part of your small intestine (duodenum). This procedure is also called esophagogastroduodenoscopy (EGD) or gastroscopy. In this procedure, your health care provider passes a thin, flexible tube (endoscope) through your mouth and down your esophagus into your stomach. A small camera is attached to the end of the tube. Images from the camera appear on a monitor in the exam room. During this procedure, your health care provider may also remove a small piece of tissue to be sent to a lab and examined under a microscope (biopsy). Your health care provider may do an upper endoscopy to diagnose cancers of the upper GI tract. You may also have this procedure to find the cause of other conditions, such as:  Stomach pain.  Heartburn.  Pain or problems when swallowing.  Nausea and vomiting.  Stomach  bleeding.  Stomach ulcers. Tell a health care provider about:  Any allergies you have.  All medicines you are taking, including vitamins, herbs, eye drops, creams, and over-the-counter medicines.  Any problems you or family members have had with anesthetic medicines.  Any blood disorders you have.  Any surgeries you have had.  Any medical conditions you have.  Whether you are pregnant or may be pregnant. What are the risks? Generally, this is a safe procedure. However, problems may occur, including:  Infection.  Bleeding.  Allergic reactions to medicines.  A tear or hole (perforation) in the esophagus, stomach, or duodenum. What happens before the procedure? Staying hydrated Follow instructions from your health care provider about hydration, which may include:  Up to 2 hours before the procedure - you may continue to drink clear liquids, such as water, clear fruit juice, black coffee, and plain tea.  Eating and drinking restrictions Follow instructions from your health care provider about eating and drinking, which may include:  8 hours before the procedure - stop eating heavy meals or foods, such as meat, fried foods, or fatty foods.  6 hours before the procedure - stop eating light meals or foods, such as toast or cereal.  6 hours before the procedure - stop drinking milk or drinks that contain milk.  2 hours before the procedure - stop  drinking clear liquids. Medicines Ask your health care provider about:  Changing or stopping your regular medicines. This is especially important if you are taking diabetes medicines or blood thinners.  Taking medicines such as aspirin and ibuprofen. These medicines can thin your blood. Do not take these medicines unless your health care provider tells you to take them.  Taking over-the-counter medicines, vitamins, herbs, and supplements. General instructions  Plan to have someone take you home from the hospital or clinic.  If  you will be going home right after the procedure, plan to have someone with you for 24 hours.  Ask your health care provider what steps will be taken to help prevent infection. What happens during the procedure?  1. An IV will be inserted into one of your veins. 2. You may be given one or more of the following: ? A medicine to help you relax (sedative). ? A medicine to numb the throat (local anesthetic). 3. You will lie on your left side on an exam table. 4. Your health care provider will pass the endoscope through your mouth and down your esophagus. 5. Your health care provider will use the scope to check the inside of your esophagus, stomach, and duodenum. Biopsies may be taken. 6. The endoscope will be removed. The procedure may vary among health care providers and hospitals. What happens after the procedure?  Your blood pressure, heart rate, breathing rate, and blood oxygen level will be monitored until you leave the hospital or clinic.  Do not drive for 24 hours if you were given a sedative during your procedure.  When your throat is no longer numb, you may be given some fluids to drink.  It is up to you to get the results of your procedure. Ask your health care provider, or the department that is doing the procedure, when your results will be ready. Summary  Upper endoscopy is a procedure to look inside the upper GI tract.  During the procedure, an IV will be inserted into one of your veins. You may be given a medicine to help you relax.  A medicine will be used to numb your throat.  The endoscope will be passed through your mouth and down your esophagus. This information is not intended to replace advice given to you by your health care provider. Make sure you discuss any questions you have with your health care provider. Document Revised: 11/03/2017 Document Reviewed: 10/11/2017 Elsevier Patient Education  [place] After  Please read the instructions outlined below and refer to this sheet in the next few weeks. These discharge instructions provide you with general information on caring for yourself after you leave the hospital. Your doctor may also give you  specific instructions. While your treatment has been planned according to the most current medical practices available, unavoidable complications occasionally occur. If you have any problems or questions after discharge, please call your doctor. HOME CARE INSTRUCTIONS Activity  You may resume your regular activity but move at a slower pace for the next 24 hours.   Take frequent rest periods for the next 24 hours.   Walking will help expel (get rid of) the air and reduce the bloated feeling in your abdomen.   No driving for 24 hours (because of the anesthesia (medicine) used during the test).   You may shower.   Do not sign any important legal documents or operate any machinery for 24 hours (because of the anesthesia used during the test).  Nutrition  Drink plenty of fluids.   You may resume your normal diet.   Begin with a light meal and progress to your normal diet.   Avoid alcoholic beverages for 24 hours or as instructed by your caregiver.  Medications You may resume your normal medications unless your caregiver tells you otherwise. What you can expect today  You may experience abdominal discomfort such as a feeling of fullness or "gas" pains.   You may experience a sore throat for 2 to 3 days. This is normal. Gargling with salt water may help this.  Follow-up Your doctor will discuss the results of your test with you. SEEK IMMEDIATE MEDICAL CARE IF:  You have excessive nausea (feeling sick to your stomach) and/or vomiting.   You have severe abdominal pain and distention (swelling).   You have trouble swallowing.   You have a  temperature over 100 F (37.8 C).   You have rectal bleeding or vomiting of blood.  Document Released: 12/24/2003 Document Revised: 04/30/2011 Document Reviewed: 07/06/2007

## 2020-05-27 ENCOUNTER — Encounter (HOSPITAL_COMMUNITY): Payer: Self-pay

## 2020-05-27 ENCOUNTER — Other Ambulatory Visit: Payer: Self-pay

## 2020-05-27 ENCOUNTER — Other Ambulatory Visit (HOSPITAL_COMMUNITY): Admission: RE | Admit: 2020-05-27 | Payer: PRIVATE HEALTH INSURANCE | Source: Ambulatory Visit

## 2020-05-27 ENCOUNTER — Encounter (HOSPITAL_COMMUNITY)
Admission: RE | Admit: 2020-05-27 | Discharge: 2020-05-27 | Disposition: A | Discharge status: Home / Self Care | Payer: PRIVATE HEALTH INSURANCE | Source: Ambulatory Visit | Attending: Internal Medicine | Admitting: Internal Medicine

## 2020-05-27 ENCOUNTER — Telehealth: Payer: Self-pay

## 2020-05-27 NOTE — Telephone Encounter (Signed)
Derrick Beltran at [facility] endo called office and LMOVM. Pt was scheduled for pre-op appt today for EGD tomorrow. He's canceling procedure d/t the cost his insurance is requiring and he's unable to afford to have procedure done at this time.  Tried to call pt, no answer, unable to leave voicemail d/t mailbox is full.  FYI to Dr. Marletta Lor.

## 2020-05-27 NOTE — Telephone Encounter (Signed)
Spoke to pt's wife, confirmed he wanted to cancel procedure. Advised her to let our office know when he's ready to proceed with procedure.

## 2020-05-28 ENCOUNTER — Ambulatory Visit (HOSPITAL_COMMUNITY): Admission: RE | Admit: 2020-05-28 | Payer: PRIVATE HEALTH INSURANCE | Source: Home / Self Care

## 2020-05-28 ENCOUNTER — Encounter (HOSPITAL_COMMUNITY): Admission: RE | Payer: Self-pay | Source: Home / Self Care

## 2020-05-28 SURGERY — ESOPHAGOGASTRODUODENOSCOPY (EGD) WITH PROPOFOL
Anesthesia: Monitor Anesthesia Care

## 2020-05-28 NOTE — Telephone Encounter (Signed)
Noted, thanks!

## 2020-07-24 ENCOUNTER — Other Ambulatory Visit (HOSPITAL_COMMUNITY): Payer: Self-pay | Admitting: Internal Medicine

## 2020-07-24 ENCOUNTER — Ambulatory Visit (HOSPITAL_COMMUNITY)
Admission: RE | Admit: 2020-07-24 | Discharge: 2020-07-24 | Disposition: A | Discharge status: Home / Self Care | Payer: Medicaid Other | Source: Ambulatory Visit | Attending: Internal Medicine | Admitting: Internal Medicine

## 2020-07-24 ENCOUNTER — Other Ambulatory Visit: Payer: Self-pay

## 2020-07-24 DIAGNOSIS — R051 Acute cough: Secondary | ICD-10-CM

## 2020-07-24 DIAGNOSIS — R0602 Shortness of breath: Secondary | ICD-10-CM

## 2020-08-01 ENCOUNTER — Inpatient Hospital Stay (HOSPITAL_COMMUNITY)
Admission: EM | Admit: 2020-08-01 | Discharge: 2020-08-12 | DRG: 871 | Disposition: A | Discharge status: Home / Self Care | Payer: Medicaid Other | Attending: Internal Medicine | Admitting: Internal Medicine

## 2020-08-01 ENCOUNTER — Emergency Department (HOSPITAL_COMMUNITY): Payer: Medicaid Other

## 2020-08-01 ENCOUNTER — Other Ambulatory Visit: Payer: Self-pay

## 2020-08-01 DIAGNOSIS — N179 Acute kidney failure, unspecified: Secondary | ICD-10-CM

## 2020-08-01 DIAGNOSIS — J188 Other pneumonia, unspecified organism: Secondary | ICD-10-CM | POA: Diagnosis present

## 2020-08-01 DIAGNOSIS — R634 Abnormal weight loss: Secondary | ICD-10-CM

## 2020-08-01 DIAGNOSIS — E876 Hypokalemia: Secondary | ICD-10-CM | POA: Diagnosis present

## 2020-08-01 DIAGNOSIS — F17210 Nicotine dependence, cigarettes, uncomplicated: Secondary | ICD-10-CM | POA: Diagnosis present

## 2020-08-01 DIAGNOSIS — F1020 Alcohol dependence, uncomplicated: Secondary | ICD-10-CM | POA: Diagnosis present

## 2020-08-01 DIAGNOSIS — R627 Adult failure to thrive: Secondary | ICD-10-CM | POA: Diagnosis present

## 2020-08-01 DIAGNOSIS — K219 Gastro-esophageal reflux disease without esophagitis: Secondary | ICD-10-CM | POA: Diagnosis present

## 2020-08-01 DIAGNOSIS — R451 Restlessness and agitation: Secondary | ICD-10-CM | POA: Diagnosis present

## 2020-08-01 DIAGNOSIS — J850 Gangrene and necrosis of lung: Secondary | ICD-10-CM | POA: Diagnosis present

## 2020-08-01 DIAGNOSIS — N170 Acute kidney failure with tubular necrosis: Secondary | ICD-10-CM | POA: Diagnosis present

## 2020-08-01 DIAGNOSIS — J189 Pneumonia, unspecified organism: Secondary | ICD-10-CM

## 2020-08-01 DIAGNOSIS — Z720 Tobacco use: Secondary | ICD-10-CM | POA: Diagnosis present

## 2020-08-01 DIAGNOSIS — G9341 Metabolic encephalopathy: Secondary | ICD-10-CM

## 2020-08-01 DIAGNOSIS — R4189 Other symptoms and signs involving cognitive functions and awareness: Secondary | ICD-10-CM | POA: Diagnosis present

## 2020-08-01 DIAGNOSIS — Z9114 Patient's other noncompliance with medication regimen: Secondary | ICD-10-CM

## 2020-08-01 DIAGNOSIS — Z6820 Body mass index (BMI) 20.0-20.9, adult: Secondary | ICD-10-CM

## 2020-08-01 DIAGNOSIS — R64 Cachexia: Secondary | ICD-10-CM | POA: Diagnosis present

## 2020-08-01 DIAGNOSIS — E860 Dehydration: Secondary | ICD-10-CM | POA: Diagnosis present

## 2020-08-01 DIAGNOSIS — F05 Delirium due to known physiological condition: Secondary | ICD-10-CM | POA: Diagnosis not present

## 2020-08-01 DIAGNOSIS — E871 Hypo-osmolality and hyponatremia: Secondary | ICD-10-CM

## 2020-08-01 DIAGNOSIS — J941 Fibrothorax: Secondary | ICD-10-CM | POA: Diagnosis present

## 2020-08-01 DIAGNOSIS — I700 Atherosclerosis of aorta: Secondary | ICD-10-CM | POA: Diagnosis present

## 2020-08-01 DIAGNOSIS — R6520 Severe sepsis without septic shock: Secondary | ICD-10-CM | POA: Diagnosis present

## 2020-08-01 DIAGNOSIS — Z79899 Other long term (current) drug therapy: Secondary | ICD-10-CM

## 2020-08-01 DIAGNOSIS — A419 Sepsis, unspecified organism: Principal | ICD-10-CM

## 2020-08-01 DIAGNOSIS — Z8701 Personal history of pneumonia (recurrent): Secondary | ICD-10-CM

## 2020-08-01 DIAGNOSIS — D72829 Elevated white blood cell count, unspecified: Secondary | ICD-10-CM

## 2020-08-01 DIAGNOSIS — Z20822 Contact with and (suspected) exposure to covid-19: Secondary | ICD-10-CM | POA: Diagnosis present

## 2020-08-01 DIAGNOSIS — Z79891 Long term (current) use of opiate analgesic: Secondary | ICD-10-CM

## 2020-08-01 DIAGNOSIS — Z781 Physical restraint status: Secondary | ICD-10-CM

## 2020-08-01 MED ORDER — LACTATED RINGERS IV SOLN: INTRAVENOUS | Status: AC

## 2020-08-01 MED ORDER — SODIUM CHLORIDE 0.9 % IV SOLN
500.0000 mg | INTRAVENOUS | Status: DC
  Administered 2020-08-02: 500 mg via INTRAVENOUS
  Filled 2020-08-01: qty 500

## 2020-08-01 MED ORDER — SODIUM CHLORIDE 0.9 % IV SOLN
2.0000 g | INTRAVENOUS | Status: DC
  Administered 2020-08-02: 2 g via INTRAVENOUS
  Filled 2020-08-01: qty 20

## 2020-08-01 MED ORDER — LACTATED RINGERS IV BOLUS (SEPSIS)
1000.0000 mL | Freq: Once | INTRAVENOUS | Status: AC
  Administered 2020-08-02: 1000 mL via INTRAVENOUS

## 2020-08-01 NOTE — ED Notes (Signed)
Placed pt on Oxygen at 2lpm via [place].

## 2020-08-01 NOTE — ED Triage Notes (Signed)
Recently diagnosed with lung infection and prescribed abx but pt has not been improving and refused to take his meds. Denies any fever and shortness of breath. Per wife, pt has not been acting like himself.

## 2020-08-02 ENCOUNTER — Encounter (HOSPITAL_COMMUNITY): Payer: Self-pay | Admitting: Family Medicine

## 2020-08-02 ENCOUNTER — Emergency Department (HOSPITAL_COMMUNITY): Payer: Medicaid Other

## 2020-08-02 ENCOUNTER — Inpatient Hospital Stay (HOSPITAL_COMMUNITY): Payer: Medicaid Other

## 2020-08-02 DIAGNOSIS — N179 Acute kidney failure, unspecified: Secondary | ICD-10-CM

## 2020-08-02 DIAGNOSIS — A419 Sepsis, unspecified organism: Principal | ICD-10-CM

## 2020-08-02 DIAGNOSIS — R627 Adult failure to thrive: Secondary | ICD-10-CM | POA: Diagnosis present

## 2020-08-02 DIAGNOSIS — R634 Abnormal weight loss: Secondary | ICD-10-CM

## 2020-08-02 DIAGNOSIS — J189 Pneumonia, unspecified organism: Secondary | ICD-10-CM | POA: Diagnosis not present

## 2020-08-02 DIAGNOSIS — R6520 Severe sepsis without septic shock: Secondary | ICD-10-CM

## 2020-08-02 DIAGNOSIS — J188 Other pneumonia, unspecified organism: Secondary | ICD-10-CM | POA: Diagnosis present

## 2020-08-02 DIAGNOSIS — Z79899 Other long term (current) drug therapy: Secondary | ICD-10-CM | POA: Diagnosis not present

## 2020-08-02 DIAGNOSIS — Z6820 Body mass index (BMI) 20.0-20.9, adult: Secondary | ICD-10-CM | POA: Diagnosis not present

## 2020-08-02 DIAGNOSIS — R4189 Other symptoms and signs involving cognitive functions and awareness: Secondary | ICD-10-CM | POA: Diagnosis present

## 2020-08-02 DIAGNOSIS — E871 Hypo-osmolality and hyponatremia: Secondary | ICD-10-CM | POA: Diagnosis present

## 2020-08-02 DIAGNOSIS — E876 Hypokalemia: Secondary | ICD-10-CM | POA: Diagnosis present

## 2020-08-02 DIAGNOSIS — F1020 Alcohol dependence, uncomplicated: Secondary | ICD-10-CM | POA: Diagnosis present

## 2020-08-02 DIAGNOSIS — F17210 Nicotine dependence, cigarettes, uncomplicated: Secondary | ICD-10-CM | POA: Diagnosis present

## 2020-08-02 DIAGNOSIS — J850 Gangrene and necrosis of lung: Secondary | ICD-10-CM | POA: Diagnosis present

## 2020-08-02 DIAGNOSIS — N170 Acute kidney failure with tubular necrosis: Secondary | ICD-10-CM | POA: Diagnosis present

## 2020-08-02 DIAGNOSIS — Z20822 Contact with and (suspected) exposure to covid-19: Secondary | ICD-10-CM | POA: Diagnosis present

## 2020-08-02 DIAGNOSIS — R451 Restlessness and agitation: Secondary | ICD-10-CM | POA: Diagnosis present

## 2020-08-02 DIAGNOSIS — E860 Dehydration: Secondary | ICD-10-CM | POA: Diagnosis present

## 2020-08-02 DIAGNOSIS — R64 Cachexia: Secondary | ICD-10-CM | POA: Diagnosis present

## 2020-08-02 DIAGNOSIS — Z79891 Long term (current) use of opiate analgesic: Secondary | ICD-10-CM | POA: Diagnosis not present

## 2020-08-02 DIAGNOSIS — K219 Gastro-esophageal reflux disease without esophagitis: Secondary | ICD-10-CM | POA: Diagnosis present

## 2020-08-02 DIAGNOSIS — G9341 Metabolic encephalopathy: Secondary | ICD-10-CM | POA: Diagnosis present

## 2020-08-02 DIAGNOSIS — D72829 Elevated white blood cell count, unspecified: Secondary | ICD-10-CM

## 2020-08-02 DIAGNOSIS — J941 Fibrothorax: Secondary | ICD-10-CM | POA: Diagnosis present

## 2020-08-02 DIAGNOSIS — I700 Atherosclerosis of aorta: Secondary | ICD-10-CM | POA: Diagnosis present

## 2020-08-02 DIAGNOSIS — F05 Delirium due to known physiological condition: Secondary | ICD-10-CM | POA: Diagnosis not present

## 2020-08-02 LAB — BASIC METABOLIC PANEL
Anion gap: 13 (ref 5–15)
BUN: 72 mg/dL — ABNORMAL HIGH (ref 6–20)
CO2: 23 mmol/L (ref 22–32)
Calcium: 8.3 mg/dL — ABNORMAL LOW (ref 8.9–10.3)
Chloride: 96 mmol/L — ABNORMAL LOW (ref 98–111)
Creatinine, Ser: 1.6 mg/dL — ABNORMAL HIGH (ref 0.61–1.24)
GFR, Estimated: 49 mL/min — ABNORMAL LOW (ref >60)
Glucose, Bld: 124 mg/dL — ABNORMAL HIGH (ref 70–99)
Potassium: 4.1 mmol/L (ref 3.5–5.1)
Sodium: 132 mmol/L — ABNORMAL LOW (ref 135–145)

## 2020-08-02 LAB — RESP PANEL BY RT-PCR (FLU A&B, COVID) ARPGX2
Influenza A by PCR: NEGATIVE
Influenza B by PCR: NEGATIVE
SARS Coronavirus 2 by RT PCR: NEGATIVE

## 2020-08-02 LAB — COMPREHENSIVE METABOLIC PANEL
ALT: 15 U/L (ref 0–44)
AST: 26 U/L (ref 15–41)
Albumin: 2 g/dL — ABNORMAL LOW (ref 3.5–5.0)
Alkaline Phosphatase: 62 U/L (ref 38–126)
Anion gap: 17 — ABNORMAL HIGH (ref 5–15)
BUN: 84 mg/dL — ABNORMAL HIGH (ref 6–20)
CO2: 23 mmol/L (ref 22–32)
Calcium: 9.6 mg/dL (ref 8.9–10.3)
Chloride: 90 mmol/L — ABNORMAL LOW (ref 98–111)
Creatinine, Ser: 2.08 mg/dL — ABNORMAL HIGH (ref 0.61–1.24)
GFR, Estimated: 36 mL/min — ABNORMAL LOW (ref >60)
Glucose, Bld: 173 mg/dL — ABNORMAL HIGH (ref 70–99)
Potassium: 5 mmol/L (ref 3.5–5.1)
Sodium: 130 mmol/L — ABNORMAL LOW (ref 135–145)
Total Bilirubin: 0.7 mg/dL (ref 0.3–1.2)
Total Protein: 8 g/dL (ref 6.5–8.1)

## 2020-08-02 LAB — CBC WITH DIFFERENTIAL/PLATELET
Abs Immature Granulocytes: 0 10*3/uL (ref 0.00–0.07)
Basophils Absolute: 0 10*3/uL (ref 0.0–0.1)
Basophils Relative: 0 %
Eosinophils Absolute: 0 10*3/uL (ref 0.0–0.5)
Eosinophils Relative: 0 %
HCT: 43.1 % (ref 39.0–52.0)
Hemoglobin: 14.5 g/dL (ref 13.0–17.0)
Lymphocytes Relative: 3 %
Lymphs Abs: 0.8 10*3/uL (ref 0.7–4.0)
MCH: 29.6 pg (ref 26.0–34.0)
MCHC: 33.6 g/dL (ref 30.0–36.0)
MCV: 88 fL (ref 80.0–100.0)
Monocytes Absolute: 2.1 10*3/uL — ABNORMAL HIGH (ref 0.1–1.0)
Monocytes Relative: 8 %
Neutro Abs: 23.2 10*3/uL — ABNORMAL HIGH (ref 1.7–7.7)
Neutrophils Relative %: 89 %
Platelets: 590 10*3/uL — ABNORMAL HIGH (ref 150–400)
RBC: 4.9 MIL/uL (ref 4.22–5.81)
RDW: 13.9 % (ref 11.5–15.5)
WBC: 26.1 10*3/uL — ABNORMAL HIGH (ref 4.0–10.5)
nRBC: 0 /100 WBC
nRBC: 0.1 % (ref 0.0–0.2)

## 2020-08-02 LAB — CBC
HCT: 34.2 % — ABNORMAL LOW (ref 39.0–52.0)
Hemoglobin: 11.1 g/dL — ABNORMAL LOW (ref 13.0–17.0)
MCH: 28.7 pg (ref 26.0–34.0)
MCHC: 32.5 g/dL (ref 30.0–36.0)
MCV: 88.4 fL (ref 80.0–100.0)
Platelets: 359 10*3/uL (ref 150–400)
RBC: 3.87 MIL/uL — ABNORMAL LOW (ref 4.22–5.81)
RDW: 13.5 % (ref 11.5–15.5)
WBC: 18.8 10*3/uL — ABNORMAL HIGH (ref 4.0–10.5)
nRBC: 0 % (ref 0.0–0.2)

## 2020-08-02 LAB — LACTIC ACID, PLASMA
Lactic Acid, Venous: 6.5 mmol/L (ref 0.5–1.9)
Lactic Acid, Venous: 4.5 mmol/L (ref 0.5–1.9)
Lactic Acid, Venous: 2.1 mmol/L (ref 0.5–1.9)
Lactic Acid, Venous: 2 mmol/L (ref 0.5–1.9)
Lactic Acid, Venous: 1.6 mmol/L (ref 0.5–1.9)

## 2020-08-02 LAB — URINALYSIS, ROUTINE W REFLEX MICROSCOPIC
Bilirubin Urine: NEGATIVE
Glucose, UA: NEGATIVE mg/dL
Hgb urine dipstick: NEGATIVE
Ketones, ur: 5 mg/dL — AB
Leukocytes,Ua: NEGATIVE
Nitrite: NEGATIVE
Protein, ur: NEGATIVE mg/dL
Specific Gravity, Urine: 1.014 (ref 1.005–1.030)
pH: 5 (ref 5.0–8.0)

## 2020-08-02 LAB — HIV ANTIBODY (ROUTINE TESTING W REFLEX): HIV Screen 4th Generation wRfx: NONREACTIVE

## 2020-08-02 LAB — MRSA PCR SCREENING: MRSA by PCR: NEGATIVE

## 2020-08-02 LAB — PROTIME-INR
INR: 1.3 — ABNORMAL HIGH (ref 0.8–1.2)
Prothrombin Time: 15.9 seconds — ABNORMAL HIGH (ref 11.4–15.2)

## 2020-08-02 LAB — PROCALCITONIN: Procalcitonin: 2.45 ng/mL

## 2020-08-02 LAB — APTT: aPTT: 27 seconds (ref 24–36)

## 2020-08-02 MED ORDER — VANCOMYCIN HCL 1250 MG/250ML IV SOLN
1250.0000 mg | Freq: Once | INTRAVENOUS | Status: AC
  Administered 2020-08-02: 1250 mg via INTRAVENOUS
  Filled 2020-08-02: qty 250

## 2020-08-02 MED ORDER — LACTATED RINGERS IV SOLN: INTRAVENOUS | Status: DC

## 2020-08-02 MED ORDER — ACETAMINOPHEN 650 MG RE SUPP
650.0000 mg | Freq: Four times a day (QID) | RECTAL | Status: DC | PRN
  Filled 2020-08-02: qty 1

## 2020-08-02 MED ORDER — THIAMINE HCL 100 MG/ML IJ SOLN
100.0000 mg | Freq: Every day | INTRAMUSCULAR | Status: DC
  Administered 2020-08-02 – 2020-08-03 (×2): 100 mg via INTRAVENOUS
  Filled 2020-08-02 (×2): qty 2

## 2020-08-02 MED ORDER — ENOXAPARIN SODIUM 40 MG/0.4ML ~~LOC~~ SOLN
40.0000 mg | Freq: Every day | SUBCUTANEOUS | Status: DC
  Administered 2020-08-02 – 2020-08-11 (×10): 40 mg via SUBCUTANEOUS
  Filled 2020-08-02 (×11): qty 0.4

## 2020-08-02 MED ORDER — ONDANSETRON HCL 4 MG PO TABS: 4.0000 mg | ORAL_TABLET | Freq: Four times a day (QID) | ORAL | Status: DC | PRN

## 2020-08-02 MED ORDER — SODIUM CHLORIDE 0.9 % IV BOLUS
500.0000 mL | INTRAVENOUS | Status: DC | PRN
  Administered 2020-08-02: 500 mL via INTRAVENOUS

## 2020-08-02 MED ORDER — HALOPERIDOL LACTATE 5 MG/ML IJ SOLN
5.0000 mg | Freq: Once | INTRAMUSCULAR | Status: AC
  Administered 2020-08-02: 5 mg via INTRAVENOUS

## 2020-08-02 MED ORDER — CHLORHEXIDINE GLUCONATE CLOTH 2 % EX PADS
6.0000 | MEDICATED_PAD | Freq: Every day | CUTANEOUS | Status: DC
  Administered 2020-08-02 – 2020-08-12 (×10): 6 via TOPICAL

## 2020-08-02 MED ORDER — ZIPRASIDONE MESYLATE 20 MG IM SOLR
10.0000 mg | Freq: Once | INTRAMUSCULAR | Status: AC
  Administered 2020-08-02: 10 mg via INTRAMUSCULAR
  Filled 2020-08-02: qty 20

## 2020-08-02 MED ORDER — ALBUTEROL SULFATE (2.5 MG/3ML) 0.083% IN NEBU: 2.5000 mg | INHALATION_SOLUTION | RESPIRATORY_TRACT | Status: DC | PRN

## 2020-08-02 MED ORDER — NICOTINE 14 MG/24HR TD PT24: 14.0000 mg | MEDICATED_PATCH | Freq: Every day | TRANSDERMAL | Status: DC

## 2020-08-02 MED ORDER — HALOPERIDOL LACTATE 5 MG/ML IJ SOLN
2.0000 mg | Freq: Once | INTRAMUSCULAR | Status: AC
  Administered 2020-08-02: 2 mg via INTRAVENOUS
  Filled 2020-08-02: qty 1

## 2020-08-02 MED ORDER — SODIUM CHLORIDE 0.9 % IV SOLN: 500.0000 mg | INTRAVENOUS | Status: DC

## 2020-08-02 MED ORDER — ACETAMINOPHEN 325 MG PO TABS
650.0000 mg | ORAL_TABLET | Freq: Four times a day (QID) | ORAL | Status: DC | PRN
  Administered 2020-08-04: 650 mg via ORAL
  Filled 2020-08-02: qty 2

## 2020-08-02 MED ORDER — SODIUM CHLORIDE 0.9 % IV SOLN: 2.0000 g | INTRAVENOUS | Status: DC

## 2020-08-02 MED ORDER — PIPERACILLIN-TAZOBACTAM 3.375 G IVPB
3.3750 g | Freq: Three times a day (TID) | INTRAVENOUS | Status: DC
  Administered 2020-08-02 – 2020-08-09 (×21): 3.375 g via INTRAVENOUS
  Filled 2020-08-02 (×21): qty 50

## 2020-08-02 MED ORDER — SODIUM CHLORIDE 0.9 % IV SOLN
1.0000 mg | Freq: Every day | INTRAVENOUS | Status: DC
  Administered 2020-08-02: 1 mg via INTRAVENOUS
  Filled 2020-08-02 (×3): qty 0.2

## 2020-08-02 MED ORDER — LACTATED RINGERS IV BOLUS
1000.0000 mL | Freq: Once | INTRAVENOUS | Status: AC
  Administered 2020-08-02: 1000 mL via INTRAVENOUS

## 2020-08-02 MED ORDER — VANCOMYCIN HCL 750 MG/150ML IV SOLN
750.0000 mg | INTRAVENOUS | Status: DC
  Administered 2020-08-03: 750 mg via INTRAVENOUS
  Filled 2020-08-02: qty 150

## 2020-08-02 MED ORDER — STERILE WATER FOR INJECTION IJ SOLN
INTRAMUSCULAR | Status: AC
  Administered 2020-08-02: 1 mL
  Filled 2020-08-02: qty 10

## 2020-08-02 MED ORDER — ONDANSETRON HCL 4 MG/2ML IJ SOLN: 4.0000 mg | Freq: Four times a day (QID) | INTRAMUSCULAR | Status: DC | PRN

## 2020-08-02 MED ORDER — HALOPERIDOL LACTATE 5 MG/ML IJ SOLN
INTRAMUSCULAR | Status: AC
  Filled 2020-08-02: qty 1

## 2020-08-02 NOTE — Progress Notes (Signed)
Following for Code Sepsis  

## 2020-08-02 NOTE — Progress Notes (Signed)
  Subjective: Patient admitted this morning, see detailed H&P by Dr Tonie Griffith 60 year old male admitted with pneumonia and altered mental status.  He has history of empyema in the past, he recovered from that and was seen by his PCP who diagnosed him with pneumonia and started on antibiotics.  He was prescribed Bactrim however patient had intermittent compliance with this medication.  He was brought to the hospital for evaluation for labored breathing, poor p.o. intake. Chest x-ray showed multifocal pneumonia with cavitary lesions.  CT of the chest obtained showed chronic cavitary lesions with worsening and signs of necrotizing pneumonia on CT scan.  Patient was combative and he was given Haldol and Geodon in the ED. PCCM also was consulted  Vitals:   08/02/20 1345 08/02/20 1441  BP: 107/77 113/69  Pulse: (!) 102 (!) 102  Resp: (!) 25 20  Temp:  97.8 F (36.6 C)  SpO2: 100% 99%      A/P Severe sepsis-secondary to multifocal cavitary pneumonia, concern for anaerobic infection.  He will need at least month of anaerobic coverage as per PCCM and outpatient imaging to be followed by pulmonology.  Continue vancomycin and Zosyn.   Metabolic encephalopathy-patient received Haldol and Geodon in the ED.  Currently somnolent.  Leukocytosis-WBC 25,000, likely from sepsis as above.  Follow CBC in a.m.  Unintentional weight loss-as per wife patient has unintentional weight loss for 1 month.  Likely from underlying pneumonia, will also need to rule out malignancy    [place] [facility] Pager- [phone]

## 2020-08-02 NOTE — ED Notes (Signed)
Spoke with admitting MD RE pt's bp trending down.  Map reamains above 65.  MD stated to give bolus if sbp decreases below 90.

## 2020-08-02 NOTE — Consult Note (Signed)
NAME:  Derrick Beltran, MRN:  [id number], DOB:  March 14, 1961, LOS: 0 ADMISSION DATE:  08/01/2020, CONSULTATION DATE:  08/02/20 REFERRING MD:  Darrick Meigs, CHIEF COMPLAINT:  AMS   Brief History   60 year old man with hx of incompletely treated prevotella pneumonia in 2019, FTT, alcohol abuse, noncompliance, recurrent AMA presenting with multifocal cavitary pneumonia.  History of present illness   60 year old man with PMH below presenting on insistence of his wife for failure to thrive, worsening SOB, cough, and weakness.  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  Had been following intermittently with Dr. Roxan Hockey with [facility].  History per his wife as patient is pretty altered in ER, unable to answer questions.  Has been having worsening breathing/coughing up "nasty stuff" for 5 months. Has been essentially bedbound for past 2 weeks only getting up to go to bathroom. Has been abstinent from cigarettes and alcohol for 6 weeks because he has been feeling so lousy. She does not think he chokes on food or has been having any fevers.  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 CT head neg CT A/P: question enteritis, question bladder wall thickening UA neg  Micro Data:  Sputum>> COVID/flu neg  Antimicrobials:  Vanc 3/10>> Zosyn 3/10>>   Interim history/subjective:  Consulted  Objective   Blood pressure 106/69, pulse (!) 103, temperature 98.4 F (36.9 C), temperature  source Oral, resp. rate 19, height 5\' 9"  (1.753 m), weight 61.5 kg, SpO2 100 %.       No intake or output data in the 24 hours ending 08/02/20 0933 Filed Weights   08/01/20 2323  Weight: 61.5 kg    Examination: Constitutional: ill appearing man resting in bed  Eyes: EOMI, tracks, pupils appear equal Ears, nose, mouth, and throat: MM dry, very poor dentition Cardiovascular: tachycardic, ext warm Respiratory: rhonci bilaterally, transmitted upper airway sounds c/w retained pharyngeal secretions Gastrointestinal: Soft, +BS Skin: No rashes, normal turgor Neurologic: moves all 4 ext but not to command Psychiatric: RASS -1  Mild hyponatremia Prerenal AKI Lactic acidemia improving  Resolved Hospital Problem list   n/a  Assessment & Plan:  Severe sepsis secondary to multifocal cavitary pneumonia- this seems c/w smoldering anaerobic infection, likely the prevotella - Check sputum culture if able - Check MRSA PCR - He needs at least a month of anaerobic coverage and OP imaging f/u either with pulm or TCTS - No indication for bronch at present  Hx recurrent AMA, cachexia, FTT- would Rx him augmentin if he tries to leave again, currently he does not have capacity to make this decision nor the strength to leave again.  I told wife he will likely die in next month or two if he does not get this infection adequately treated and improve his nutrition.  Acute metabolic encephalopathy- hopefully just related to sepsis, dehydration, and malnutrition.  No EtOH for 6 weeks per wife. - IV hydration - Thiamine, folate  Best practice:  Diet: NPO Pain/Anxiety/Delirium protocol (if indicated): n/a VAP protocol (  if indicated): n/a DVT prophylaxis: lovenox GI prophylaxis: n/a Glucose control: n/a Mobility: BR Code Status: full Family Communication: updated wife by phone 3/11 Disposition: per TRH, will follow with you one more day    Labs   CBC: Recent Labs  Lab 08/01/20 2341  08/02/20 0630  WBC 26.1* 18.8*  NEUTROABS 23.2*  --   HGB 14.5 11.1*  HCT 43.1 34.2*  MCV 88.0 88.4  PLT 590* 751    Basic Metabolic Panel: Recent Labs  Lab 08/01/20 2340 08/02/20 0630  NA 130* 132*  K 5.0 4.1  CL 90* 96*  CO2 23 23  GLUCOSE 173* 124*  BUN 84* 72*  CREATININE 2.08* 1.60*  CALCIUM 9.6 8.3*   GFR: Estimated Creatinine Clearance: 43.2 mL/min (A) (by C-G formula based on SCr of 1.6 mg/dL (H)). Recent Labs  Lab 08/01/20 2341 08/02/20 0300 08/02/20 0630  PROCALCITON  --   --  2.45  WBC 26.1*  --  18.8*  LATICACIDVEN 6.5* 4.5* 2.1*    Liver Function Tests: Recent Labs  Lab 08/01/20 2340  AST 26  ALT 15  ALKPHOS 62  BILITOT 0.7  PROT 8.0  ALBUMIN 2.0*   No results for input(s): LIPASE, AMYLASE in the last 168 hours. No results for input(s): AMMONIA in the last 168 hours.  ABG No results found for: PHART, PCO2ART, PO2ART, HCO3, TCO2, ACIDBASEDEF, O2SAT   Coagulation Profile: Recent Labs  Lab 08/01/20 2348  INR 1.3*    Cardiac Enzymes: No results for input(s): CKTOTAL, CKMB, CKMBINDEX, TROPONINI in the last 168 hours.  HbA1C: Hgb A1c MFr Bld  Date/Time Value Ref Range Status  03/04/2018 10:13 AM 4.9 4.8 - 5.6 % Final    Comment:    (NOTE) Pre diabetes:          5.7%-6.4% Diabetes:              >6.4% Glycemic control for   <7.0% adults with diabetes     CBG: No results for input(s): GLUCAP in the last 168 hours.  Review of Systems:   Patient is too encephalopathic to answer  Past Medical History  He,  has a past medical history of Empyema ([place]) (03/03/2018; 03/14/2018) and GERD (gastroesophageal reflux disease).   Surgical History    Past Surgical History:  Procedure Laterality Date  . NO PAST SURGERIES       Social History   reports that he has been smoking cigarettes. He has a 45.00 pack-year smoking history. He has never used smokeless tobacco. He reports current alcohol use. He reports that he does not use drugs.    Family History   His family history includes Alzheimer's disease in his father and mother.   Allergies No Known Allergies   Home Medications  Prior to Admission medications   Medication Sig Start Date End Date Taking? Authorizing Provider  albuterol (VENTOLIN HFA) 108 (90 Base) MCG/ACT inhaler Inhale 2 puffs into the lungs every 4 (four) hours as needed for wheezing or shortness of breath. 06/19/20  Yes [provider]  buPROPion (WELLBUTRIN SR) 100 MG 12 hr tablet Take 100 mg by mouth 2 (two) times daily. 06/24/20  Yes [provider]  guaiFENesin-codeine 100-10 MG/5ML syrup Take 5 mLs by mouth 2 (two) times daily as needed for cough. 07/24/20  Yes [provider]  Hydrocortisone, Perianal, 1 % CREA Apply 1 application topically daily as needed (sore). 02/21/20  Yes [provider]  sodium chloride 1 g tablet Take 1 tablet  by mouth at bedtime. 04/04/20  Yes [provider]  sulfamethoxazole-trimethoprim (BACTRIM DS) 800-160 MG tablet Take 1 tablet by mouth 2 (two) times daily. 07/25/20  Yes [provider]  traZODone (DESYREL) 50 MG tablet Take 100 mg by mouth at bedtime. 02/21/20  Yes [provider]  Vitamin D, Ergocalciferol, (DRISDOL) 1.25 MG (50000 UNIT) CAPS capsule Take 50,000 Units by mouth every Thursday. 07/25/20  Yes [provider]

## 2020-08-02 NOTE — Progress Notes (Signed)
Pharmacy Antibiotic Note   Derrick Beltran is a 60 y.o. male admitted on 08/01/2020 with necrotizing pneumonia .  Pharmacy has been consulted for Vancomycin dosing. Not responding to outpatient PO bactrim. WBC is elevated. Noted renal dysfunction.   Plan: Vancomycin 1250 mg IV x 1, then 750 mg IV q24h >>>Estimated AUC: 521 Ceftriaxone/Azithromycin per MD Trend WBC, temp, renal function  F/U infectious work-up Drug levels as indicated   Height: 5\' 9"  (175.3 cm) Weight: 61.5 kg (135 lb 9.3 oz) IBW/kg (Calculated) : 70.7  Temp (24hrs), Avg:98.4 F (36.9 C), Min:98.4 F (36.9 C), Max:98.4 F (36.9 C)  Recent Labs  Lab 08/01/20 2340 08/01/20 2341 08/02/20 0300  WBC  --  26.1*  --   CREATININE 2.08*  --   --   LATICACIDVEN  --  6.5* 4.5*    Estimated Creatinine Clearance: 33.3 mL/min (A) (by C-G formula based on SCr of 2.08 mg/dL (H)).    No Known Allergies  Narda Bonds, PharmD, BCPS Clinical Pharmacist Phone: [phone]

## 2020-08-02 NOTE — ED Provider Notes (Signed)
[facility] EMERGENCY DEPARTMENT Provider Note   CSN: [id number] Arrival date & time: 08/01/20  2312     History Chief Complaint  Patient presents with  . Cough  . Anorexia  . Altered Mental Status    Derrick Beltran is a 60 y.o. male.  60 year old male who presents to the emergency department today secondary to altered mental status.  He is accompanied by his wife who gives most of the history as the patient is altered.  Some history is also obtained from the chart.  Patient has a history of an empyema in the past.  Had recovered pretty well from that and was seen by his doctor last week and diagnosed with pneumonia started on antibiotics, Bactrim, however patient has had intermittent compliance with this medication.  I would last couple days his wife had to slipped into his food and drink.  He has become more altered and not acting himself with more difficulty breathing.  And thus she brought him here for evaluation.  She states he does appear pale.   Cough Altered Mental Status    Past Medical History:  Diagnosis Date  . Empyema ([place]) 03/03/2018; 03/14/2018  . GERD (gastroesophageal reflux disease)     Patient Active Problem List   Diagnosis Date Noted  . Multifocal pneumonia 08/02/2020  . Unintentional weight loss 08/02/2020  . Sepsis ([place]) 08/02/2020  . Acute metabolic encephalopathy 75/02/2584  . AKI (acute kidney injury) ([place]) 08/02/2020  . Leukocytosis 08/02/2020  . 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 Malnutrition of moderate degree 03/17/2018  . Hyponatremia 03/04/2018  . Pulmonary nodule 03/04/2018  . Aortic atherosclerosis ([place]) 03/04/2018  . Tobacco use 03/04/2018  . Pleural empyema ([place]) 03/03/2018    Past Surgical History:  Procedure Laterality Date  . NO PAST SURGERIES         Family History  Problem Relation Age of Onset  . Alzheimer's disease Mother   . Alzheimer's disease Father     Social History   Tobacco Use  .  Smoking status: Current Every Day Smoker    Packs/day: 1.00    Years: 45.00    Pack years: 45.00    Types: Cigarettes  . Smokeless tobacco: Never Used  Vaping Use  . Vaping Use: Never used  Substance Use Topics  . Alcohol use: Yes    Comment: 03/03/2018 "3-4 / day and more on weekends "' 05/22/20 avg 1-2 daily  . Drug use: Never    Home Medications Prior to Admission medications   Medication Sig Start Date End Date Taking? Authorizing Provider  amoxicillin-clavulanate (AUGMENTIN) 500-125 MG tablet Take 1 tablet by mouth 2 (two) times daily. Patient not taking: Reported on 05/22/2020 02/29/20   [provider]  Hydrocortisone, Perianal, 1 % CREA Apply 1 application topically as needed. 02/21/20   [provider]  naproxen sodium (ALEVE) 220 MG tablet Take 960 mg by mouth daily as needed (pain).    [provider]  sodium chloride 1 g tablet Take 1 tablet by mouth at bedtime. 04/04/20   [provider]  traZODone (DESYREL) 50 MG tablet Take 100 mg by mouth at bedtime. 02/21/20   [provider]  UNABLE TO FIND Take 1 Units by mouth as needed. Med Name: pt endorses taking an acid reflux pill but is not sure which one, its an OTC Patient not taking: Reported on 05/22/2020    [provider]    Allergies    Patient has no known  allergies.  Review of Systems   Review of Systems  Respiratory: Positive for cough.   All other systems reviewed and are negative.   Physical Exam Updated Vital Signs BP 114/70   Pulse (!) 113   Temp 98.4 F (36.9 C) (Oral)   Resp (!) 22   Ht 5\' 9"  (1.753 m)   Wt 61.5 kg   SpO2 100%   BMI 20.02 kg/m   Physical Exam Vitals and nursing note reviewed.  Constitutional:      General: He is in acute distress.     Appearance: He is well-developed. He is ill-appearing.     Comments: cachectic  HENT:     Head: Normocephalic and atraumatic.     Mouth/Throat:     Mouth: Mucous membranes are dry.      Pharynx: Oropharynx is clear.  Eyes:     Pupils: Pupils are equal, round, and reactive to light.  Cardiovascular:     Rate and Rhythm: Tachycardia present.  Pulmonary:     Effort: Pulmonary effort is normal. No respiratory distress.  Abdominal:     General: Abdomen is flat. There is no distension.     Tenderness: There is no abdominal tenderness.  Musculoskeletal:        General: Normal range of motion.     Cervical back: Normal range of motion.  Skin:    General: Skin is warm and dry.     Coloration: Skin is not pale.  Neurological:     Mental Status: He is alert. He is disoriented.     ED Results / Procedures / Treatments   Labs (all labs ordered are listed, but only abnormal results are displayed) Labs Reviewed  COMPREHENSIVE METABOLIC PANEL - Abnormal; Notable for the following components:      Result Value   Sodium 130 (*)    Chloride 90 (*)    Glucose, Bld 173 (*)    BUN 84 (*)    Creatinine, Ser 2.08 (*)    Albumin 2.0 (*)    GFR, Estimated 36 (*)    Anion gap 17 (*)    All other components within normal limits  LACTIC ACID, PLASMA - Abnormal; Notable for the following components:   Lactic Acid, Venous 6.5 (*)    All other components within normal limits  LACTIC ACID, PLASMA - Abnormal; Notable for the following components:   Lactic Acid, Venous 4.5 (*)    All other components within normal limits  CBC WITH DIFFERENTIAL/PLATELET - Abnormal; Notable for the following components:   WBC 26.1 (*)    Platelets 590 (*)    Neutro Abs 23.2 (*)    Monocytes Absolute 2.1 (*)    All other components within normal limits  URINALYSIS, ROUTINE W REFLEX MICROSCOPIC - Abnormal; Notable for the following components:   APPearance HAZY (*)    Ketones, ur 5 (*)    All other components within normal limits  PROTIME-INR - Abnormal; Notable for the following components:   Prothrombin Time 15.9 (*)    INR 1.3 (*)    All other components within normal limits  RESP PANEL BY  RT-PCR (FLU A&B, COVID) ARPGX2  CULTURE, BLOOD (ROUTINE X 2)  CULTURE, BLOOD (ROUTINE X 2)  URINE CULTURE  APTT  LEGIONELLA PNEUMOPHILA SEROGP 1 UR AG  HIV ANTIBODY (ROUTINE TESTING W REFLEX)  PROCALCITONIN  CBC  CREATININE, SERUM  CBC  BASIC METABOLIC PANEL  LACTIC ACID, PLASMA  LACTIC ACID, PLASMA  LACTIC ACID, PLASMA  CBG MONITORING, ED    EKG EKG Interpretation  Date/Time:  Friday August 02 2020 05:24:03 EST Ventricular Rate:  108 PR Interval:    QRS Duration: 71 QT Interval:  345 QTC Calculation: 463 R Axis:   71 Text Interpretation: Sinus tachycardia Left atrial enlargement When compared with ECG of 08/01/2020, No significant change was found Confirmed by Delora Fuel ([id number]) on 08/02/2020 6:22:39 AM   Radiology CT Chest Wo Contrast  Result Date: 08/02/2020 CLINICAL DATA:  Recently diagnosed with lung infection, prescribed antibiotics without improvement, altered behavior, renal parenchymal disease hematuria EXAM: CT CHEST, ABDOMEN AND PELVIS WITHOUT CONTRAST TECHNIQUE: Multidetector CT imaging of the chest, abdomen and pelvis was performed following the standard protocol without IV contrast. COMPARISON:  CT chest, abdomen and pelvis 03/12/2020, radiograph 08/01/2020 FINDINGS: CT CHEST FINDINGS Cardiovascular: Normal cardiac size. Calcifications upon the chordae tendinae. Coronary artery calcifications are present. Small pericardial effusion, new from prior. Atherosclerotic plaque within the normal caliber aorta. No hyperdense mural thickening or plaque displacement. No periaortic stranding or hemorrhage. Normal 3 vessel branching of the aortic arch with minimal plaque in the proximal great vessels. Central arteries top-normal caliber. Luminal evaluation of the vasculature precluded in the absence of contrast media Mediastinum/Nodes: Redemonstrated mediastinal and hilar adenopathy. Index right paratracheal node measures 9 mm short axis, previously 12 mm. Additional paratracheal  and subcarinal nodal nodes are similar to decreased in size. No acute abnormality of the esophagus. Trachea is patent. Thyroid gland and thoracic inlet are unremarkable. Lungs/Pleura: Extensive bronchiectatic changes with circumferential peribronchial opacity throughout the right hemithorax with more mild airways thickening left. Worsening cavitary changes in the right upper lobe now involving large portions of the right middle and lower lobe as well. Layering air-fluid levels are seen towards the right lung apex and within additional cavitary foci towards lung base. Interspersed within the aerated portions of the right lung and extensively throughout the left lung are additional centrilobular predominant areas of consolidation and ground-glass opacity some of which appear to be demonstrating developing central cavitation as well. This superimposed mixed consolidative, ground-glass and cavitary process is entirely new from comparison portable radiography corresponds to worsening airspace disease on the chest radiograph. No convincing left pneumothorax or pleural effusion. Musculoskeletal: Multilevel degenerative changes are present in the imaged portions of the spine. Multilevel flowing anterior osteophytosis, compatible with features of diffuse idiopathic skeletal hyperostosis (DISH). No acute or concerning osseous lesions. No clear involvement of the chest wall by the presumed infectious process in the right hemithorax. Asymmetric expansion the left chest cavity is similar to prior. CT ABDOMEN PELVIS FINDINGS Hepatobiliary: No visible focal liver lesion within limitations of this unenhanced CT. Normal liver attenuation. Smooth liver surface contour. Normal gallbladder and biliary tree. Pancreas: No pancreatic ductal dilatation or surrounding inflammatory changes. Spleen: Normal in size. No concerning splenic lesions. Adrenals/Urinary Tract: Normal adrenal glands. No significant perinephric stranding inflammation  accounting for motion artifact kidneys are symmetric in size and normally located. No visible or contour deforming renal lesion. No urolithiasis or hydronephrosis. Urinary bladder is grossly unremarkable aside from indentation of bladder base by an enlarged prostate. Stomach/Bowel: Distal esophagus, stomach and duodenum are unremarkable. Normal sweep across the midline abdomen. Air and fluid-filled loops of small bowel throughout the abdomen and pelvis without frank distention at this time, overall nonspecific in appearance but could reflect an enteritis. Colon is largely decompressed with minimal formed stool at the rectal vault. No colonic thickening or dilatation. Normal noninflamed appendix in the right lower quadrant.  No evidence of bowel obstruction. Vascular/Lymphatic: Atherosclerotic calcifications within the abdominal aorta and branch vessels. No aneurysm or ectasia. No enlarged abdominopelvic lymph nodes. Reproductive: Borderline prostatomegaly with indentation of the bladder base. Dense calcification of the seminal vesicles, nonspecific, can be a senescent finding vascular calcifications of penis additional calcification noted towards the glans penis possibly related to the foreskin though motion artifact degrades imaging quality. Other: No abdominopelvic free air or fluid. No bowel containing hernia. Musculoskeletal: Multilevel degenerative changes are present in the imaged portions of the spine. Straightening of normal lumbar lordosis. Additional degenerative changes in the hips and pelvis including partial bony fusion across the left SI joint. No acute osseous abnormality or suspicious osseous lesion. IMPRESSION: 1. Worsening thick-walled cavitary changes throughout the right hemithorax now involving large portions of the right middle lower lobe as well. Layering air-fluid levels are seen towards the right lung apex and within additional cavitary foci towards the lung base. Additional new widespread  areas of bilateral centrilobular predominant consolidation and ground-glass opacity throughout the aerated portions of both lungs, some of which appears to demonstrate early central cavitation as well. Findings are concerning for worsening multifocal necrotizing pulmonary infection. 2. Redemonstrated mediastinal and hilar adenopathy, nonspecific though much of which is similar to decreased in size from prior. 3. Small pericardial effusion. 4. Air and fluid-filled loops of small bowel throughout the abdomen and pelvis without frank distention at this time, overall nonspecific in appearance but could reflect an enteritis. 5. Borderline prostatomegaly with indentation of the bladder base. Correlate for clinical symptoms of outlet obstruction. 6. Aortic Atherosclerosis (ICD10-I70.0). Electronically Signed   By: Lovena Le M.D.   On: 08/02/2020 03:18   DG Chest Portable 1 View  Result Date: 08/01/2020 CLINICAL DATA:  Altered mental status. EXAM: PORTABLE CHEST 1 VIEW COMPARISON:  Radiograph 07/24/2020.  CT 03/11/2020 FINDINGS: Chronic volume loss in the right hemithorax. Chronic bronchiectasis and scarring right upper lobe. Previous air-fluid level within right apical cavity is not as well visualized. There is increasing patchy opacities throughout the right hemithorax. Blunting of the right costophrenic angle may be due to pleural effusion or volume loss. There is worsening airspace disease in the left mid lower lung zone. No pneumothorax. Left costophrenic angle excluded from the field of view, patient had difficulty tolerating the exam IMPRESSION: 1. Worsening airspace disease throughout the right hemithorax and left mid and lower lung zone, suspicious for pneumonia, including atypical infection. 2. Chronic volume loss in the right hemithorax with bronchiectasis and scarring right upper lobe. Previous air-fluid level within the right apical cavity is not as well visualized. Electronically Signed   By: Keith Rake M.D.   On: 08/01/2020 23:59   CT Renal Stone Study  Result Date: 08/02/2020 CLINICAL DATA:  Recently diagnosed with lung infection, prescribed antibiotics without improvement, altered behavior, renal parenchymal disease hematuria EXAM: CT CHEST, ABDOMEN AND PELVIS WITHOUT CONTRAST TECHNIQUE: Multidetector CT imaging of the chest, abdomen and pelvis was performed following the standard protocol without IV contrast. COMPARISON:  CT chest, abdomen and pelvis 03/12/2020, radiograph 08/01/2020 FINDINGS: CT CHEST FINDINGS Cardiovascular: Normal cardiac size. Calcifications upon the chordae tendinae. Coronary artery calcifications are present. Small pericardial effusion, new from prior. Atherosclerotic plaque within the normal caliber aorta. No hyperdense mural thickening or plaque displacement. No periaortic stranding or hemorrhage. Normal 3 vessel branching of the aortic arch with minimal plaque in the proximal great vessels. Central arteries top-normal caliber. Luminal evaluation of the vasculature precluded in the absence of contrast  media Mediastinum/Nodes: Redemonstrated mediastinal and hilar adenopathy. Index right paratracheal node measures 9 mm short axis, previously 12 mm. Additional paratracheal and subcarinal nodal nodes are similar to decreased in size. No acute abnormality of the esophagus. Trachea is patent. Thyroid gland and thoracic inlet are unremarkable. Lungs/Pleura: Extensive bronchiectatic changes with circumferential peribronchial opacity throughout the right hemithorax with more mild airways thickening left. Worsening cavitary changes in the right upper lobe now involving large portions of the right middle and lower lobe as well. Layering air-fluid levels are seen towards the right lung apex and within additional cavitary foci towards lung base. Interspersed within the aerated portions of the right lung and extensively throughout the left lung are additional centrilobular predominant  areas of consolidation and ground-glass opacity some of which appear to be demonstrating developing central cavitation as well. This superimposed mixed consolidative, ground-glass and cavitary process is entirely new from comparison portable radiography corresponds to worsening airspace disease on the chest radiograph. No convincing left pneumothorax or pleural effusion. Musculoskeletal: Multilevel degenerative changes are present in the imaged portions of the spine. Multilevel flowing anterior osteophytosis, compatible with features of diffuse idiopathic skeletal hyperostosis (DISH). No acute or concerning osseous lesions. No clear involvement of the chest wall by the presumed infectious process in the right hemithorax. Asymmetric expansion the left chest cavity is similar to prior. CT ABDOMEN PELVIS FINDINGS Hepatobiliary: No visible focal liver lesion within limitations of this unenhanced CT. Normal liver attenuation. Smooth liver surface contour. Normal gallbladder and biliary tree. Pancreas: No pancreatic ductal dilatation or surrounding inflammatory changes. Spleen: Normal in size. No concerning splenic lesions. Adrenals/Urinary Tract: Normal adrenal glands. No significant perinephric stranding inflammation accounting for motion artifact kidneys are symmetric in size and normally located. No visible or contour deforming renal lesion. No urolithiasis or hydronephrosis. Urinary bladder is grossly unremarkable aside from indentation of bladder base by an enlarged prostate. Stomach/Bowel: Distal esophagus, stomach and duodenum are unremarkable. Normal sweep across the midline abdomen. Air and fluid-filled loops of small bowel throughout the abdomen and pelvis without frank distention at this time, overall nonspecific in appearance but could reflect an enteritis. Colon is largely decompressed with minimal formed stool at the rectal vault. No colonic thickening or dilatation. Normal noninflamed appendix in the right  lower quadrant. No evidence of bowel obstruction. Vascular/Lymphatic: Atherosclerotic calcifications within the abdominal aorta and branch vessels. No aneurysm or ectasia. No enlarged abdominopelvic lymph nodes. Reproductive: Borderline prostatomegaly with indentation of the bladder base. Dense calcification of the seminal vesicles, nonspecific, can be a senescent finding vascular calcifications of penis additional calcification noted towards the glans penis possibly related to the foreskin though motion artifact degrades imaging quality. Other: No abdominopelvic free air or fluid. No bowel containing hernia. Musculoskeletal: Multilevel degenerative changes are present in the imaged portions of the spine. Straightening of normal lumbar lordosis. Additional degenerative changes in the hips and pelvis including partial bony fusion across the left SI joint. No acute osseous abnormality or suspicious osseous lesion. IMPRESSION: 1. Worsening thick-walled cavitary changes throughout the right hemithorax now involving large portions of the right middle lower lobe as well. Layering air-fluid levels are seen towards the right lung apex and within additional cavitary foci towards the lung base. Additional new widespread areas of bilateral centrilobular predominant consolidation and ground-glass opacity throughout the aerated portions of both lungs, some of which appears to demonstrate early central cavitation as well. Findings are concerning for worsening multifocal necrotizing pulmonary infection. 2. Redemonstrated mediastinal and hilar adenopathy, nonspecific though  much of which is similar to decreased in size from prior. 3. Small pericardial effusion. 4. Air and fluid-filled loops of small bowel throughout the abdomen and pelvis without frank distention at this time, overall nonspecific in appearance but could reflect an enteritis. 5. Borderline prostatomegaly with indentation of the bladder base. Correlate for clinical  symptoms of outlet obstruction. 6. Aortic Atherosclerosis (ICD10-I70.0). Electronically Signed   By: Lovena Le M.D.   On: 08/02/2020 03:18    Procedures .Critical Care Performed by: Merrily Pew, MD Authorized by: Merrily Pew, MD   Critical care provider statement:    Critical care time (minutes):  45   Critical care was necessary to treat or prevent imminent or life-threatening deterioration of the following conditions:  Sepsis and shock   Critical care was time spent personally by me on the following activities:  Discussions with consultants, evaluation of patient's response to treatment, examination of patient, ordering and performing treatments and interventions, ordering and review of laboratory studies, ordering and review of radiographic studies, pulse oximetry, re-evaluation of patient's condition, obtaining history from patient or surrogate and review of old charts     Medications Ordered in ED Medications  lactated ringers infusion ( Intravenous New Bag/Given 08/02/20 0022)  cefTRIAXone (ROCEPHIN) 2 g in sodium chloride 0.9 % 100 mL IVPB (0 g Intravenous Stopped 08/02/20 0043)  azithromycin (ZITHROMAX) 500 mg in sodium chloride 0.9 % 250 mL IVPB (0 mg Intravenous Stopped 08/02/20 0137)  lactated ringers infusion ( Intravenous New Bag/Given 08/02/20 0528)  enoxaparin (LOVENOX) injection 40 mg (has no administration in time range)  lactated ringers infusion (has no administration in time range)  cefTRIAXone (ROCEPHIN) 2 g in sodium chloride 0.9 % 100 mL IVPB (has no administration in time range)  azithromycin (ZITHROMAX) 500 mg in sodium chloride 0.9 % 250 mL IVPB (has no administration in time range)  acetaminophen (TYLENOL) tablet 650 mg (has no administration in time range)    Or  acetaminophen (TYLENOL) suppository 650 mg (has no administration in time range)  ondansetron (ZOFRAN) tablet 4 mg (has no administration in time range)    Or  ondansetron (ZOFRAN) injection 4 mg  (has no administration in time range)  albuterol (PROVENTIL) (2.5 MG/3ML) 0.083% nebulizer solution 2.5 mg (has no administration in time range)  nicotine (NICODERM CQ - dosed in mg/24 hours) patch 14 mg (has no administration in time range)  vancomycin (VANCOREADY) IVPB 750 mg/150 mL (has no administration in time range)  lactated ringers bolus 1,000 mL (0 mLs Intravenous Stopped 08/02/20 0121)    And  lactated ringers bolus 1,000 mL (0 mLs Intravenous Stopped 08/02/20 0121)  vancomycin (VANCOREADY) IVPB 1250 mg/250 mL (0 mg Intravenous Stopped 08/02/20 0350)  haloperidol lactate (HALDOL) injection 2 mg (2 mg Intravenous Given 08/02/20 0104)  lactated ringers bolus 1,000 mL (1,000 mLs Intravenous Bolus 08/02/20 0317)  haloperidol lactate (HALDOL) injection 5 mg ( Intravenous Canceled Entry 08/02/20 0427)  ziprasidone (GEODON) injection 10 mg (10 mg Intramuscular Given 08/02/20 0526)  sterile water (preservative free) injection (1 mL  Given 08/02/20 0526)    ED Course  I have reviewed the triage vital signs and the nursing notes.  Pertinent labs & imaging results that were available during my care of the patient were reviewed by me and considered in my medical decision making (see chart for details).    MDM Rules/Calculators/A&P  Patient appears acutely ill.  He is cachectic appearing, disoriented and very dry mucous membranes with tachycardia and hypotension.  Will initiate sepsis protocol.  Antibiotics given for commune acquired pneumonia.  Will discuss with pharmacy if I should add anything on with his history of necrotizing pneumonia.  Otherwise fluids and work-up as normal. Pharmacy recommended add on vancomycin for MRSA coverage.   Multiple reevaluations patient persistently agitated and not following directions so soft restraints were applied.  Chemical sedation also given which seemed to improve symptoms some.  He did ultimately have a significant acute renal  insufficiency so CT stone study done showing prostatomegaly.  Catheter placed and a large amount of urine was retrieved.  Fluids were already given for this as well.  Also was by compounded by his pneumonia and sepsis.  CT of his chest showed worsening cavitary lesions.  After discussion with the hospitalist we will go and consult pulmonology for consideration of bronchoscope to evaluate further.  Otherwise heart rate improved, blood pressures are improved, lactic acid is improving.  Patient appears to be stable for admission to stepdown at this time. Discussed with Hospitalist who will admit.   Final Clinical Impression(s) / ED Diagnoses Final diagnoses:  Dehydration  Sepsis, due to unspecified organism, unspecified whether acute organ dysfunction present [id number])  Recurrent pneumonia    Rx / DC Orders ED Discharge Orders    None       Finola Rosal, [place] Cornea, MD 08/02/20 [phone]

## 2020-08-02 NOTE — H&P (Signed)
History and Physical    Derrick Beltran SJG:[id number] DOB: 24-Nov-1960 DOA: 08/01/2020  PCP: Denyce Robert, FNP   Patient coming from: Home  Chief Complaint: Altered mental status  HPI: Derrick Beltran is a 60 y.o. male with medical history significant for pneumonia who presents with altered mental status.  History is obtained from the ER physician who spoke with the wife when she was here. No family at bedside at time I am called to evaluate the patient. Some history is also obtained from the chart.  Patient has a history of an empyema in the past.  Had recovered pretty well from that and was seen by his doctor last week and diagnosed with pneumonia and started on antibiotics, Bactrim, however patient has had intermittent compliance with this medication. Reportedly the past few days the wife had to slip the antibiotic into any food he would eat. He has become more altered and not acting himself with more difficulty breathing. The wife reported that the past few days he has not been eating or drinking much. With his breathing looking labored last night she brought him to hospital for evaluation.   ED Course: Chest x-ray showed multifocal pneumonia with cavitary lesions.  CT of the chest was obtained which showed patient's chronic cavitary lesions were worsening and had signs of necrotizing pneumonia on the CT scan.  Patient was combative and altered.  He was given multiple doses of Haldol and Geodon in the emergency room.  He required supplemental oxygen due to hypoxia.  Found to have elevated white blood cell count and elevated lactic acid level.  He was given IV fluid bolus per sepsis protocol.  Hospitalist service been asked to admit patient for further management  Review of Systems:  Unable to obtain secondary to acute medical condition with encephalopathy  Past Medical History:  Diagnosis Date  . Empyema ([place]) 03/03/2018; 03/14/2018  . GERD (gastroesophageal reflux disease)     Past Surgical  History:  Procedure Laterality Date  . NO PAST SURGERIES      Social History  reports that he has been smoking cigarettes. He has a 45.00 pack-year smoking history. He has never used smokeless tobacco. He reports current alcohol use. He reports that he does not use drugs.  No Known Allergies  Family History  Problem Relation Age of Onset  . Alzheimer's disease Mother   . Alzheimer's disease Father      Prior to Admission medications   Medication Sig Start Date End Date Taking? Authorizing Provider  amoxicillin-clavulanate (AUGMENTIN) 500-125 MG tablet Take 1 tablet by mouth 2 (two) times daily. Patient not taking: Reported on 05/22/2020 02/29/20   [provider]  Hydrocortisone, Perianal, 1 % CREA Apply 1 application topically as needed. 02/21/20   [provider]  naproxen sodium (ALEVE) 220 MG tablet Take 960 mg by mouth daily as needed (pain).    [provider]  sodium chloride 1 g tablet Take 1 tablet by mouth at bedtime. 04/04/20   [provider]  traZODone (DESYREL) 50 MG tablet Take 100 mg by mouth at bedtime. 02/21/20   [provider]  UNABLE TO FIND Take 1 Units by mouth as needed. Med Name: pt endorses taking an acid reflux pill but is not sure which one, its an OTC Patient not taking: Reported on 05/22/2020    [provider]    Physical Exam: Vitals:   08/02/20 0330 08/02/20 0345 08/02/20 0400 08/02/20 0515  BP: 102/64 102/69 101/66 114/70  Pulse: Marland Kitchen)  110 (!) 112 (!) 107 (!) 113  Resp: 16 (!) 22 (!) 22   Temp:      TempSrc:      SpO2: 100% 100% 100% 100%  Weight:      Height:        Constitutional: NAD, calm, comfortable Vitals:   08/02/20 0330 08/02/20 0345 08/02/20 0400 08/02/20 0515  BP: 102/64 102/69 101/66 114/70  Pulse: (!) 110 (!) 112 (!) 107 (!) 113  Resp: 16 (!) 22 (!) 22   Temp:      TempSrc:      SpO2: 100% 100% 100% 100%  Weight:      Height:       General: WDWN, Alert. Confused and  does not follow commands. Mumbles incoherently  Eyes: PERRL, conjunctivae normal.  Sclera nonicteric HENT:  [place]/AT, external ears normal.  Nares patent without epistasis.  Mucous membranes are dry Neck: Soft, normal passive range of motion, supple, no masses, no thyromegaly.  Trachea midline Respiratory: Diminished breath sounds bilaterally. Diffuse Rhonchi. tachypnea.  No wheezing, no crackles. No accessory muscle use.  Cardiovascular: Sinus tachycardia. no murmurs / rubs / gallops. No extremity edema. 1+ pedal pulses.  Abdomen: Soft, no tenderness, nondistended, no rebound or guarding.  No masses palpated. No HSM palpated. Bowel sounds normoactive Musculoskeletal: FROM. Has clubbing of digits. no cyanosis. No joint deformity upper and lower extremities. Normal muscle tone.  Skin: Warm, dry, intact no rashes, lesions, ulcers. No induration Neurologic: Moves extremities spontaneously and withdraws from painful stimuli. DTR +1 bilaterally. Complete neurologic exam cannot be obtained due to encephalopathy.    Labs on Admission: I have personally reviewed following labs and imaging studies  CBC: Recent Labs  Lab 08/01/20 2341  WBC 26.1*  NEUTROABS 23.2*  HGB 14.5  HCT 43.1  MCV 88.0  PLT 590*    Basic Metabolic Panel: Recent Labs  Lab 08/01/20 2340  NA 130*  K 5.0  CL 90*  CO2 23  GLUCOSE 173*  BUN 84*  CREATININE 2.08*  CALCIUM 9.6    GFR: Estimated Creatinine Clearance: 33.3 mL/min (A) (by C-G formula based on SCr of 2.08 mg/dL (H)).  Liver Function Tests: Recent Labs  Lab 08/01/20 2340  AST 26  ALT 15  ALKPHOS 62  BILITOT 0.7  PROT 8.0  ALBUMIN 2.0*    Urine analysis:    Component Value Date/Time   COLORURINE YELLOW 08/02/2020 0422   APPEARANCEUR HAZY (A) 08/02/2020 0422   LABSPEC 1.014 08/02/2020 0422   PHURINE 5.0 08/02/2020 0422   GLUCOSEU NEGATIVE 08/02/2020 0422   HGBUR NEGATIVE 08/02/2020 0422   BILIRUBINUR NEGATIVE 08/02/2020 0422   KETONESUR 5  (A) 08/02/2020 0422   PROTEINUR NEGATIVE 08/02/2020 0422   NITRITE NEGATIVE 08/02/2020 0422   LEUKOCYTESUR NEGATIVE 08/02/2020 0422    Radiological Exams on Admission: CT Chest Wo Contrast  Result Date: 08/02/2020 CLINICAL DATA:  Recently diagnosed with lung infection, prescribed antibiotics without improvement, altered behavior, renal parenchymal disease hematuria EXAM: CT CHEST, ABDOMEN AND PELVIS WITHOUT CONTRAST TECHNIQUE: Multidetector CT imaging of the chest, abdomen and pelvis was performed following the standard protocol without IV contrast. COMPARISON:  CT chest, abdomen and pelvis 03/12/2020, radiograph 08/01/2020 FINDINGS: CT CHEST FINDINGS Cardiovascular: Normal cardiac size. Calcifications upon the chordae tendinae. Coronary artery calcifications are present. Small pericardial effusion, new from prior. Atherosclerotic plaque within the normal caliber aorta. No hyperdense mural thickening or plaque displacement. No periaortic stranding or hemorrhage. Normal 3 vessel branching of the aortic arch with  minimal plaque in the proximal great vessels. Central arteries top-normal caliber. Luminal evaluation of the vasculature precluded in the absence of contrast media Mediastinum/Nodes: Redemonstrated mediastinal and hilar adenopathy. Index right paratracheal node measures 9 mm short axis, previously 12 mm. Additional paratracheal and subcarinal nodal nodes are similar to decreased in size. No acute abnormality of the esophagus. Trachea is patent. Thyroid gland and thoracic inlet are unremarkable. Lungs/Pleura: Extensive bronchiectatic changes with circumferential peribronchial opacity throughout the right hemithorax with more mild airways thickening left. Worsening cavitary changes in the right upper lobe now involving large portions of the right middle and lower lobe as well. Layering air-fluid levels are seen towards the right lung apex and within additional cavitary foci towards lung base.  Interspersed within the aerated portions of the right lung and extensively throughout the left lung are additional centrilobular predominant areas of consolidation and ground-glass opacity some of which appear to be demonstrating developing central cavitation as well. This superimposed mixed consolidative, ground-glass and cavitary process is entirely new from comparison portable radiography corresponds to worsening airspace disease on the chest radiograph. No convincing left pneumothorax or pleural effusion. Musculoskeletal: Multilevel degenerative changes are present in the imaged portions of the spine. Multilevel flowing anterior osteophytosis, compatible with features of diffuse idiopathic skeletal hyperostosis (DISH). No acute or concerning osseous lesions. No clear involvement of the chest wall by the presumed infectious process in the right hemithorax. Asymmetric expansion the left chest cavity is similar to prior. CT ABDOMEN PELVIS FINDINGS Hepatobiliary: No visible focal liver lesion within limitations of this unenhanced CT. Normal liver attenuation. Smooth liver surface contour. Normal gallbladder and biliary tree. Pancreas: No pancreatic ductal dilatation or surrounding inflammatory changes. Spleen: Normal in size. No concerning splenic lesions. Adrenals/Urinary Tract: Normal adrenal glands. No significant perinephric stranding inflammation accounting for motion artifact kidneys are symmetric in size and normally located. No visible or contour deforming renal lesion. No urolithiasis or hydronephrosis. Urinary bladder is grossly unremarkable aside from indentation of bladder base by an enlarged prostate. Stomach/Bowel: Distal esophagus, stomach and duodenum are unremarkable. Normal sweep across the midline abdomen. Air and fluid-filled loops of small bowel throughout the abdomen and pelvis without frank distention at this time, overall nonspecific in appearance but could reflect an enteritis. Colon is  largely decompressed with minimal formed stool at the rectal vault. No colonic thickening or dilatation. Normal noninflamed appendix in the right lower quadrant. No evidence of bowel obstruction. Vascular/Lymphatic: Atherosclerotic calcifications within the abdominal aorta and branch vessels. No aneurysm or ectasia. No enlarged abdominopelvic lymph nodes. Reproductive: Borderline prostatomegaly with indentation of the bladder base. Dense calcification of the seminal vesicles, nonspecific, can be a senescent finding vascular calcifications of penis additional calcification noted towards the glans penis possibly related to the foreskin though motion artifact degrades imaging quality. Other: No abdominopelvic free air or fluid. No bowel containing hernia. Musculoskeletal: Multilevel degenerative changes are present in the imaged portions of the spine. Straightening of normal lumbar lordosis. Additional degenerative changes in the hips and pelvis including partial bony fusion across the left SI joint. No acute osseous abnormality or suspicious osseous lesion. IMPRESSION: 1. Worsening thick-walled cavitary changes throughout the right hemithorax now involving large portions of the right middle lower lobe as well. Layering air-fluid levels are seen towards the right lung apex and within additional cavitary foci towards the lung base. Additional new widespread areas of bilateral centrilobular predominant consolidation and ground-glass opacity throughout the aerated portions of both lungs, some of which appears to demonstrate  early central cavitation as well. Findings are concerning for worsening multifocal necrotizing pulmonary infection. 2. Redemonstrated mediastinal and hilar adenopathy, nonspecific though much of which is similar to decreased in size from prior. 3. Small pericardial effusion. 4. Air and fluid-filled loops of small bowel throughout the abdomen and pelvis without frank distention at this time, overall  nonspecific in appearance but could reflect an enteritis. 5. Borderline prostatomegaly with indentation of the bladder base. Correlate for clinical symptoms of outlet obstruction. 6. Aortic Atherosclerosis (ICD10-I70.0). Electronically Signed   By: Lovena Le M.D.   On: 08/02/2020 03:18   DG Chest Portable 1 View  Result Date: 08/01/2020 CLINICAL DATA:  Altered mental status. EXAM: PORTABLE CHEST 1 VIEW COMPARISON:  Radiograph 07/24/2020.  CT 03/11/2020 FINDINGS: Chronic volume loss in the right hemithorax. Chronic bronchiectasis and scarring right upper lobe. Previous air-fluid level within right apical cavity is not as well visualized. There is increasing patchy opacities throughout the right hemithorax. Blunting of the right costophrenic angle may be due to pleural effusion or volume loss. There is worsening airspace disease in the left mid lower lung zone. No pneumothorax. Left costophrenic angle excluded from the field of view, patient had difficulty tolerating the exam IMPRESSION: 1. Worsening airspace disease throughout the right hemithorax and left mid and lower lung zone, suspicious for pneumonia, including atypical infection. 2. Chronic volume loss in the right hemithorax with bronchiectasis and scarring right upper lobe. Previous air-fluid level within the right apical cavity is not as well visualized. Electronically Signed   By: Keith Rake M.D.   On: 08/01/2020 23:59   CT Renal Stone Study  Result Date: 08/02/2020 CLINICAL DATA:  Recently diagnosed with lung infection, prescribed antibiotics without improvement, altered behavior, renal parenchymal disease hematuria EXAM: CT CHEST, ABDOMEN AND PELVIS WITHOUT CONTRAST TECHNIQUE: Multidetector CT imaging of the chest, abdomen and pelvis was performed following the standard protocol without IV contrast. COMPARISON:  CT chest, abdomen and pelvis 03/12/2020, radiograph 08/01/2020 FINDINGS: CT CHEST FINDINGS Cardiovascular: Normal cardiac size.  Calcifications upon the chordae tendinae. Coronary artery calcifications are present. Small pericardial effusion, new from prior. Atherosclerotic plaque within the normal caliber aorta. No hyperdense mural thickening or plaque displacement. No periaortic stranding or hemorrhage. Normal 3 vessel branching of the aortic arch with minimal plaque in the proximal great vessels. Central arteries top-normal caliber. Luminal evaluation of the vasculature precluded in the absence of contrast media Mediastinum/Nodes: Redemonstrated mediastinal and hilar adenopathy. Index right paratracheal node measures 9 mm short axis, previously 12 mm. Additional paratracheal and subcarinal nodal nodes are similar to decreased in size. No acute abnormality of the esophagus. Trachea is patent. Thyroid gland and thoracic inlet are unremarkable. Lungs/Pleura: Extensive bronchiectatic changes with circumferential peribronchial opacity throughout the right hemithorax with more mild airways thickening left. Worsening cavitary changes in the right upper lobe now involving large portions of the right middle and lower lobe as well. Layering air-fluid levels are seen towards the right lung apex and within additional cavitary foci towards lung base. Interspersed within the aerated portions of the right lung and extensively throughout the left lung are additional centrilobular predominant areas of consolidation and ground-glass opacity some of which appear to be demonstrating developing central cavitation as well. This superimposed mixed consolidative, ground-glass and cavitary process is entirely new from comparison portable radiography corresponds to worsening airspace disease on the chest radiograph. No convincing left pneumothorax or pleural effusion. Musculoskeletal: Multilevel degenerative changes are present in the imaged portions of the spine. Multilevel flowing  anterior osteophytosis, compatible with features of diffuse idiopathic skeletal  hyperostosis (DISH). No acute or concerning osseous lesions. No clear involvement of the chest wall by the presumed infectious process in the right hemithorax. Asymmetric expansion the left chest cavity is similar to prior. CT ABDOMEN PELVIS FINDINGS Hepatobiliary: No visible focal liver lesion within limitations of this unenhanced CT. Normal liver attenuation. Smooth liver surface contour. Normal gallbladder and biliary tree. Pancreas: No pancreatic ductal dilatation or surrounding inflammatory changes. Spleen: Normal in size. No concerning splenic lesions. Adrenals/Urinary Tract: Normal adrenal glands. No significant perinephric stranding inflammation accounting for motion artifact kidneys are symmetric in size and normally located. No visible or contour deforming renal lesion. No urolithiasis or hydronephrosis. Urinary bladder is grossly unremarkable aside from indentation of bladder base by an enlarged prostate. Stomach/Bowel: Distal esophagus, stomach and duodenum are unremarkable. Normal sweep across the midline abdomen. Air and fluid-filled loops of small bowel throughout the abdomen and pelvis without frank distention at this time, overall nonspecific in appearance but could reflect an enteritis. Colon is largely decompressed with minimal formed stool at the rectal vault. No colonic thickening or dilatation. Normal noninflamed appendix in the right lower quadrant. No evidence of bowel obstruction. Vascular/Lymphatic: Atherosclerotic calcifications within the abdominal aorta and branch vessels. No aneurysm or ectasia. No enlarged abdominopelvic lymph nodes. Reproductive: Borderline prostatomegaly with indentation of the bladder base. Dense calcification of the seminal vesicles, nonspecific, can be a senescent finding vascular calcifications of penis additional calcification noted towards the glans penis possibly related to the foreskin though motion artifact degrades imaging quality. Other: No abdominopelvic  free air or fluid. No bowel containing hernia. Musculoskeletal: Multilevel degenerative changes are present in the imaged portions of the spine. Straightening of normal lumbar lordosis. Additional degenerative changes in the hips and pelvis including partial bony fusion across the left SI joint. No acute osseous abnormality or suspicious osseous lesion. IMPRESSION: 1. Worsening thick-walled cavitary changes throughout the right hemithorax now involving large portions of the right middle lower lobe as well. Layering air-fluid levels are seen towards the right lung apex and within additional cavitary foci towards the lung base. Additional new widespread areas of bilateral centrilobular predominant consolidation and ground-glass opacity throughout the aerated portions of both lungs, some of which appears to demonstrate early central cavitation as well. Findings are concerning for worsening multifocal necrotizing pulmonary infection. 2. Redemonstrated mediastinal and hilar adenopathy, nonspecific though much of which is similar to decreased in size from prior. 3. Small pericardial effusion. 4. Air and fluid-filled loops of small bowel throughout the abdomen and pelvis without frank distention at this time, overall nonspecific in appearance but could reflect an enteritis. 5. Borderline prostatomegaly with indentation of the bladder base. Correlate for clinical symptoms of outlet obstruction. 6. Aortic Atherosclerosis (ICD10-I70.0). Electronically Signed   By: Lovena Le M.D.   On: 08/02/2020 03:18    EKG: Independently reviewed.  EKG shows sinus tachycardia with no acute ST elevation or depression.  QTc 463  Assessment/Plan Principal Problem:   Multifocal pneumonia Mr. Leino is admitted to progressive care unit.  He is placed on Rocephin, azithromycin and vancomycin for empiric antibiotic coverage.  Antibiotic regimen was discussed with pharmacy recommended adding vancomycin to regimen. Patient has multiple  cavitary lesions that are worsening on CT scan.  She has been on antibiotics as an outpatient but condition worsened.  He was on Bactrim at home according to the wife who brought in his antibiotic pills showed them to the ER physician.  Will check Legionella antigen to rule this out with his worsening pneumonia despite outpatient treatment  Active Problems:   Necrotizing pneumonia  CT scan shows worsening cavitary lesions in his lungs with concern for necrotizing pneumonia. Pulmonology will be consulted.  ER physician called so they can evaluate this morning     Sepsis  Treatment meets sepsis criteria with significant leukocytosis, acute kidney injury, multifocal pneumonia, encephalopathy, elevated lactic acid level. Patient was given IV fluid bolus in the emergency room.  He continues on IV fluid hydration. Lactic acid level be rechecked every few hours Check procalcitonin level.     Hyponatremia Mild hyponatremia on labs with sodium of 130.  Patient IV fluids with LR.  Recheck electrolytes renal function this morning    Unintentional weight loss Wife reported the patient has been having unintentional weight loss for the last month with decreased p.o. intake for the last week.  Once condition stabilized will need further work-up make sure there is no underlying malignancy    Acute metabolic encephalopathy Patient placed in soft wrist restraints in the emergency room. Was given haldol and geodon in ER due to trying to get up and being combative with staff.     AKI (acute kidney injury)  IVF hydration with LR.  Recheck electrolytes and renal function on labs this am    Leukocytosis WBC over 25,000. Recheck CBC this am.     Tobacco use Nicotine patch to prevent nicotine withdrawl    DVT prophylaxis: Lovenox for DVT prophylaxis. Code Status:   Full code  Family Communication:  No family is at bedside and patient is unable to communicate secondary to encephalopathy with acute  condition Disposition Plan:   Patient is from:  Home  Anticipated DC to:  Be determined if he can go home versus may need acute rehab  Anticipated DC date:  Anticipate more than 2 midnights in hospital to treat acute condition  Anticipated DC barriers: If patient will require rehab services this could be a barrier to discharge.  We      will have discharge planning start evaluating patient and work on any referrals      that may be needed  Consults called:  Pulmonology, called by ER physician Admission status:  Inpatient  Yevonne Aline Chotiner MD [facility]  How to contact the [facility] Attending or Consulting provider [place] or covering provider during after hours [place], for this patient?   1. Check the care team in [facility] and look for a) attending/consulting [facility] provider listed and b) the [facility] team listed 2. Log into [email or web address] and use [place]'s universal password to access. If you do not have the password, please contact the hospital operator. 3. Locate the [facility] provider you are looking for under [facility] and page to a number that you can be directly reached. 4. If you still have difficulty reaching the provider, please page the [facility] (Director on Call) for the Hospitalists listed on amion for assistance.  08/02/2020, 5:50 AM

## 2020-08-03 LAB — TSH: TSH: 2.514 u[IU]/mL (ref 0.350–4.500)

## 2020-08-03 LAB — URINE CULTURE: Culture: NO GROWTH

## 2020-08-03 LAB — PROTIME-INR
INR: 1.3 — ABNORMAL HIGH (ref 0.8–1.2)
Prothrombin Time: 15.5 seconds — ABNORMAL HIGH (ref 11.4–15.2)

## 2020-08-03 LAB — VITAMIN B12: Vitamin B-12: 727 pg/mL (ref 180–914)

## 2020-08-03 LAB — FOLATE: Folate: 12.4 ng/mL (ref >5.9)

## 2020-08-03 MED ORDER — FOLIC ACID 5 MG/ML IJ SOLN
1.0000 mg | Freq: Every day | INTRAMUSCULAR | Status: DC
  Administered 2020-08-03 – 2020-08-08 (×6): 1 mg via INTRAVENOUS
  Filled 2020-08-03 (×8): qty 0.2

## 2020-08-03 MED ORDER — LORAZEPAM 2 MG/ML IJ SOLN
INTRAMUSCULAR | Status: AC
  Administered 2020-08-03: 1 mg via INTRAVENOUS
  Filled 2020-08-03: qty 1

## 2020-08-03 MED ORDER — THIAMINE HCL 100 MG/ML IJ SOLN: 100.0000 mg | INTRAMUSCULAR | Status: DC

## 2020-08-03 MED ORDER — HALOPERIDOL LACTATE 5 MG/ML IJ SOLN
2.0000 mg | Freq: Four times a day (QID) | INTRAMUSCULAR | Status: DC | PRN
  Administered 2020-08-03 – 2020-08-08 (×7): 2 mg via INTRAVENOUS
  Filled 2020-08-03 (×7): qty 1

## 2020-08-03 MED ORDER — ADULT MULTIVITAMIN W/MINERALS CH
1.0000 | ORAL_TABLET | Freq: Every day | ORAL | Status: DC
  Administered 2020-08-04 – 2020-08-12 (×9): 1 via ORAL
  Filled 2020-08-03 (×9): qty 1

## 2020-08-03 MED ORDER — THIAMINE HCL 100 MG/ML IJ SOLN
500.0000 mg | Freq: Three times a day (TID) | INTRAVENOUS | Status: AC
  Administered 2020-08-03 – 2020-08-06 (×9): 500 mg via INTRAVENOUS
  Filled 2020-08-03 (×11): qty 5

## 2020-08-03 MED ORDER — QUETIAPINE FUMARATE 25 MG PO TABS
25.0000 mg | ORAL_TABLET | Freq: Every day | ORAL | Status: DC
  Administered 2020-08-03: 25 mg via ORAL
  Filled 2020-08-03: qty 1

## 2020-08-03 MED ORDER — ENSURE ENLIVE PO LIQD
237.0000 mL | Freq: Three times a day (TID) | ORAL | Status: DC
  Administered 2020-08-04 – 2020-08-12 (×8): 237 mL via ORAL
  Filled 2020-08-03: qty 237

## 2020-08-03 MED ORDER — TRAZODONE HCL 100 MG PO TABS
100.0000 mg | ORAL_TABLET | Freq: Every day | ORAL | Status: DC
  Administered 2020-08-03 – 2020-08-11 (×8): 100 mg via ORAL
  Filled 2020-08-03 (×10): qty 1

## 2020-08-03 MED ORDER — LORAZEPAM 2 MG/ML IJ SOLN
1.0000 mg | Freq: Once | INTRAMUSCULAR | Status: AC
  Administered 2020-08-03: 1 mg via INTRAVENOUS
  Filled 2020-08-03: qty 1

## 2020-08-03 MED ORDER — LORAZEPAM 2 MG/ML IJ SOLN: 1.0000 mg | Freq: Once | INTRAMUSCULAR | Status: AC

## 2020-08-03 NOTE — Progress Notes (Signed)
PROGRESS NOTE    Derrick Beltran  LZJ:[id number] DOB: [birth year] DOA: 08/01/2020 PCP: Denyce Robert, FNP    Brief Narrative:  60 year old gentleman with history of poor compliance, recurrent pneumonia and empyema who was recently diagnosed with another bout of pneumonia and treated with Bactrim.  Patient has been getting more agitated and not taking medications, not eating well and losing weight so wife brought patient to the hospital.  In the emergency room, he was on room air.  Chest x-ray showed multifocal pneumonia with cavitary lesions.  CT head normal.  Patient was combative.  Was given Haldol and Geodon.  CT scan shows extensive scarring and multifocal areas of pneumonia.   Assessment & Plan:   Principal Problem:   Multifocal pneumonia Active Problems:   Hyponatremia   Tobacco use   Necrotizing pneumonia ([place])   Unintentional weight loss   Sepsis ([place])   Acute metabolic encephalopathy   AKI (acute kidney injury) ([place])   Leukocytosis  Multifocal pneumonia: Acute on chronic pneumonia with structural changes on his lungs.  Previous history of Prevotella infection. Currently on room air. Patient remains on vancomycin and Zosyn, cultures negative so far. Patient is altered and not participating on chest physiotherapy. Will start deep breathing exercises, incentive spirometry and flutter valve therapy as much he is able to do it.  Followed by PCCM.  Recommended at least 1 month of anaerobic coverage.  Will change to oral antibiotic on discharge.  Acute metabolic encephalopathy/confusion and delirium: Previous alcoholic.  No reported alcohol consumption for 1-1/2 months.  Unlikely to go into withdrawals. This is probably multifactorial.  CT head was normal. Will check B1, K24 and folic acid and TSH levels.  Replace if low. All-time fall precautions.  Delirium precautions. 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 We will start patient on Seroquel at  night.  Ativan and Haldol to control behavior, agitation and fall and trauma.   DVT prophylaxis: enoxaparin (LOVENOX) injection 40 mg Start: 08/02/20 1000   Code Status: Full code Family Communication: Wife on the phone Disposition Plan: Status is: Inpatient  Remains inpatient appropriate because:IV treatments appropriate due to intensity of illness or inability to take PO and Inpatient level of care appropriate due to severity of illness   Dispo: The patient is from: Home              Anticipated d/c is to: Home              Patient currently is not medically stable to d/c.   Difficult to place patient No         Consultants:   PCCM  Procedures:   None  Antimicrobials:  Antibiotics Given (last 72 hours)    Date/Time Action Medication Dose Rate   08/02/20 0013 New Bag/Given   cefTRIAXone (ROCEPHIN) 2 g in sodium chloride 0.9 % 100 mL IVPB 2 g 200 mL/hr   08/02/20 0019 New Bag/Given   azithromycin (ZITHROMAX) 500 mg in sodium chloride 0.9 % 250 mL IVPB 500 mg 250 mL/hr   08/02/20 0144 New Bag/Given   vancomycin (VANCOREADY) IVPB 1250 mg/250 mL 1,250 mg 166.7 mL/hr   08/02/20 1050 New Bag/Given   piperacillin-tazobactam (ZOSYN) IVPB 3.375 g 3.375 g 12.5 mL/hr   08/02/20 2300 New Bag/Given   piperacillin-tazobactam (ZOSYN) IVPB 3.375 g 3.375 g 12.5 mL/hr   08/03/20 0501 New Bag/Given   vancomycin (VANCOREADY) IVPB 750 mg/150 mL 750 mg 150 mL/hr   08/03/20 0501 New Bag/Given   piperacillin-tazobactam (ZOSYN)  IVPB 3.375 g 3.375 g 12.5 mL/hr         Subjective: Patient seen and examined.  Overnight events noted.  He was given restraints including Posey vest overnight.  Patient speaking without clear voice, he was asking for water.  He is trying to get up from the bed in between conversation.  He is delirious but can be reoriented.  Objective: Vitals:   08/02/20 1441 08/02/20 2340 08/03/20 0318 08/03/20 0713  BP: 113/69 123/85 111/72 113/66  Pulse: (!) 102 (!)  112 (!) 119 100  Resp: 20  17 20   Temp: 97.8 F (36.6 C) 97.7 F (36.5 C) 98.1 F (36.7 C) 98.8 F (37.1 C)  TempSrc: Axillary Oral Oral Axillary  SpO2: 99% 100% 98% 100%  Weight:      Height:        Intake/Output Summary (Last 24 hours) at 08/03/2020 0933 Last data filed at 08/03/2020 0300 Gross per 24 hour  Intake 3458.24 ml  Output 2200 ml  Net 1258.24 ml   Filed Weights   08/01/20 2323  Weight: 61.5 kg    Examination:  General exam: Appears frail and debilitated. Patient is alert and awake and oriented x1.  He is delirious and impulsive and agitated at times. Respiratory system: Mostly conducted airway sounds. Cardiovascular system: S1 & S2 heard, RRR.  Gastrointestinal system: Abdomen is nondistended, soft and nontender. No organomegaly or masses felt. Normal bowel sounds heard. Extremities: Symmetric 5 x 5 power.    Data Reviewed: I have personally reviewed following labs and imaging studies  CBC: Recent Labs  Lab 08/01/20 2341 08/02/20 0630  WBC 26.1* 18.8*  NEUTROABS 23.2*  --   HGB 14.5 11.1*  HCT 43.1 34.2*  MCV 88.0 88.4  PLT 590* 818   Basic Metabolic Panel: Recent Labs  Lab 08/01/20 2340 08/02/20 0630  NA 130* 132*  K 5.0 4.1  CL 90* 96*  CO2 23 23  GLUCOSE 173* 124*  BUN 84* 72*  CREATININE 2.08* 1.60*  CALCIUM 9.6 8.3*   GFR: Estimated Creatinine Clearance: 43.2 mL/min (A) (by C-G formula based on SCr of 1.6 mg/dL (H)). Liver Function Tests: Recent Labs  Lab 08/01/20 2340  AST 26  ALT 15  ALKPHOS 62  BILITOT 0.7  PROT 8.0  ALBUMIN 2.0*   No results for input(s): LIPASE, AMYLASE in the last 168 hours. No results for input(s): AMMONIA in the last 168 hours. Coagulation Profile: Recent Labs  Lab 08/01/20 2348 08/03/20 0451  INR 1.3* 1.3*   Cardiac Enzymes: No results for input(s): CKTOTAL, CKMB, CKMBINDEX, TROPONINI in the last 168 hours. BNP (last 3 results) No results for input(s): PROBNP in the last 8760  hours. HbA1C: No results for input(s): HGBA1C in the last 72 hours. CBG: No results for input(s): GLUCAP in the last 168 hours. Lipid Profile: No results for input(s): CHOL, HDL, LDLCALC, TRIG, CHOLHDL, LDLDIRECT in the last 72 hours. Thyroid Function Tests: No results for input(s): TSH, T4TOTAL, FREET4, T3FREE, THYROIDAB in the last 72 hours. Anemia Panel: No results for input(s): VITAMINB12, FOLATE, FERRITIN, TIBC, IRON, RETICCTPCT in the last 72 hours. Sepsis Labs: Recent Labs  Lab 08/02/20 0300 08/02/20 0630 08/02/20 1115 08/02/20 1452  PROCALCITON  --  2.45  --   --   LATICACIDVEN 4.5* 2.1* 2.0* 1.6    Recent Results (from the past 240 hour(s))  Resp Panel by RT-PCR (Flu A&B, Covid) Nasopharyngeal Swab     Status: None   Collection Time: 08/01/20  3:50 AM   Specimen: Nasopharyngeal Swab; Nasopharyngeal(NP) swabs in vial transport medium  Result Value Ref Range Status   SARS Coronavirus 2 by RT PCR NEGATIVE NEGATIVE Final    Comment: (NOTE) SARS-CoV-2 target nucleic acids are NOT DETECTED.  The SARS-CoV-2 RNA is generally detectable in upper respiratory specimens during the acute phase of infection. The lowest concentration of SARS-CoV-2 viral copies this assay can detect is 138 copies/mL. A negative result does not preclude SARS-Cov-2 infection and should not be used as the sole basis for treatment or other patient management decisions. A negative result may occur with  improper specimen collection/handling, submission of specimen other than nasopharyngeal swab, presence of viral mutation(s) within the areas targeted by this assay, and inadequate number of viral copies(<138 copies/mL). A negative result must be combined with clinical observations, patient history, and epidemiological information. The expected result is Negative.  Fact Sheet for Patients:  EntrepreneurPulse.com.au  Fact Sheet for Healthcare Providers:   IncredibleEmployment.be  This test is no t yet approved or cleared by the [place] FDA and  has been authorized for detection and/or diagnosis of SARS-CoV-2 by FDA under an Emergency Use Authorization (EUA). This EUA will remain  in effect (meaning this test can be used) for the duration of the COVID-19 declaration under Section 564(b)(1) of the Act, 21 U.S.C.section 360bbb-3(b)(1), unless the authorization is terminated  or revoked sooner.       Influenza A by PCR NEGATIVE NEGATIVE Final   Influenza B by PCR NEGATIVE NEGATIVE Final    Comment: (NOTE) The Xpert Xpress SARS-CoV-2/FLU/RSV plus assay is intended as an aid in the diagnosis of influenza from Nasopharyngeal swab specimens and should not be used as a sole basis for treatment. Nasal washings and aspirates are unacceptable for Xpert Xpress SARS-CoV-2/FLU/RSV testing.  Fact Sheet for Patients: EntrepreneurPulse.com.au  Fact Sheet for Healthcare Providers: IncredibleEmployment.be  This test is not yet approved or cleared by the [place] FDA and has been authorized for detection and/or diagnosis of SARS-CoV-2 by FDA under an Emergency Use Authorization (EUA). This EUA will remain in effect (meaning this test can be used) for the duration of the COVID-19 declaration under Section 564(b)(1) of the Act, 21 U.S.C. section 360bbb-3(b)(1), unless the authorization is terminated or revoked.  Performed at [facility], [place] [address], [place], [place] [address]   Culture, blood (Routine x 2)     Status: None (Preliminary result)   Collection Time: 08/01/20 11:25 PM   Specimen: BLOOD LEFT ARM  Result Value Ref Range Status   Specimen Description BLOOD LEFT ARM  Final   Special Requests   Final    BOTTLES DRAWN AEROBIC AND ANAEROBIC Blood Culture adequate volume   Culture   Final    NO GROWTH < 12 HOURS Performed at [facility], [place] [address] [place], [place] [address]    Report Status PENDING  Incomplete  Culture, blood (Routine x 2)     Status: None (Preliminary result)   Collection Time: 08/01/20 11:42 PM   Specimen: BLOOD RIGHT ARM  Result Value Ref Range Status   Specimen Description BLOOD RIGHT ARM  Final   Special Requests AEROBIC BOTTLE ONLY Blood Culture adequate volume  Final   Culture   Final    NO GROWTH < 12 HOURS Performed at [facility], [place] [address], [place],  [address]    Report Status PENDING  Incomplete  MRSA PCR Screening     Status:  None   Collection Time: 08/02/20 11:07 AM   Specimen: Nasopharyngeal  Result Value Ref Range Status   MRSA by PCR NEGATIVE NEGATIVE Final    Comment:        The GeneXpert MRSA Assay (FDA approved for NASAL specimens only), is one component of a comprehensive MRSA colonization surveillance program. It is not intended to diagnose MRSA infection nor to guide or monitor treatment for MRSA infections. Performed at [facility], [place] [address] [place], [place] [address]          Radiology Studies: CT HEAD WO CONTRAST  Result Date: 08/02/2020 CLINICAL DATA:  Sepsis, altered mental status EXAM: CT HEAD WITHOUT CONTRAST TECHNIQUE: Contiguous axial images were obtained from the base of the skull through the vertex without intravenous contrast. COMPARISON:  None. FINDINGS: Brain: Normal anatomic configuration. Parenchymal volume loss is commensurate with the patient's age. Mild periventricular white matter changes are present likely reflecting the sequela of small vessel ischemia. No abnormal intra or extra-axial mass lesion or fluid collection. No abnormal mass effect or midline shift. No evidence of acute intracranial hemorrhage or infarct. Ventricular size is normal. Cerebellum unremarkable. Vascular: No asymmetric hyperdense vasculature at the skull base. Skull: Intact Sinuses/Orbits: Paranasal sinuses are clear. Orbits are unremarkable. Other:  Mastoid air cells and middle ear cavities are clear. IMPRESSION: No acute intracranial abnormality.  Mild senescent change. Electronically Signed   By: Fidela Salisbury MD   On: 08/02/2020 07:10   CT Chest Wo Contrast  Result Date: 08/02/2020 CLINICAL DATA:  Recently diagnosed with lung infection, prescribed antibiotics without improvement, altered behavior, renal parenchymal disease hematuria EXAM: CT CHEST, ABDOMEN AND PELVIS WITHOUT CONTRAST TECHNIQUE: Multidetector CT imaging of the chest, abdomen and pelvis was performed following the standard protocol without IV contrast. COMPARISON:  CT chest, abdomen and pelvis 03/12/2020, radiograph 08/01/2020 FINDINGS: CT CHEST FINDINGS Cardiovascular: Normal cardiac size. Calcifications upon the chordae tendinae. Coronary artery calcifications are present. Small pericardial effusion, new from prior. Atherosclerotic plaque within the normal caliber aorta. No hyperdense mural thickening or plaque displacement. No periaortic stranding or hemorrhage. Normal 3 vessel branching of the aortic arch with minimal plaque in the proximal great vessels. Central arteries top-normal caliber. Luminal evaluation of the vasculature precluded in the absence of contrast media Mediastinum/Nodes: Redemonstrated mediastinal and hilar adenopathy. Index right paratracheal node measures 9 mm short axis, previously 12 mm. Additional paratracheal and subcarinal nodal nodes are similar to decreased in size. No acute abnormality of the esophagus. Trachea is patent. Thyroid gland and thoracic inlet are unremarkable. Lungs/Pleura: Extensive bronchiectatic changes with circumferential peribronchial opacity throughout the right hemithorax with more mild airways thickening left. Worsening cavitary changes in the right upper lobe now involving large portions of the right middle and lower lobe as well. Layering air-fluid levels are seen towards the right lung apex and within additional cavitary foci  towards lung base. Interspersed within the aerated portions of the right lung and extensively throughout the left lung are additional centrilobular predominant areas of consolidation and ground-glass opacity some of which appear to be demonstrating developing central cavitation as well. This superimposed mixed consolidative, ground-glass and cavitary process is entirely new from comparison portable radiography corresponds to worsening airspace disease on the chest radiograph. No convincing left pneumothorax or pleural effusion. Musculoskeletal: Multilevel degenerative changes are present in the imaged portions of the spine. Multilevel flowing anterior osteophytosis, compatible with features of diffuse idiopathic skeletal hyperostosis (DISH). No acute or concerning osseous lesions. No clear involvement of  the chest wall by the presumed infectious process in the right hemithorax. Asymmetric expansion the left chest cavity is similar to prior. CT ABDOMEN PELVIS FINDINGS Hepatobiliary: No visible focal liver lesion within limitations of this unenhanced CT. Normal liver attenuation. Smooth liver surface contour. Normal gallbladder and biliary tree. Pancreas: No pancreatic ductal dilatation or surrounding inflammatory changes. Spleen: Normal in size. No concerning splenic lesions. Adrenals/Urinary Tract: Normal adrenal glands. No significant perinephric stranding inflammation accounting for motion artifact kidneys are symmetric in size and normally located. No visible or contour deforming renal lesion. No urolithiasis or hydronephrosis. Urinary bladder is grossly unremarkable aside from indentation of bladder base by an enlarged prostate. Stomach/Bowel: Distal esophagus, stomach and duodenum are unremarkable. Normal sweep across the midline abdomen. Air and fluid-filled loops of small bowel throughout the abdomen and pelvis without frank distention at this time, overall nonspecific in appearance but could reflect an  enteritis. Colon is largely decompressed with minimal formed stool at the rectal vault. No colonic thickening or dilatation. Normal noninflamed appendix in the right lower quadrant. No evidence of bowel obstruction. Vascular/Lymphatic: Atherosclerotic calcifications within the abdominal aorta and branch vessels. No aneurysm or ectasia. No enlarged abdominopelvic lymph nodes. Reproductive: Borderline prostatomegaly with indentation of the bladder base. Dense calcification of the seminal vesicles, nonspecific, can be a senescent finding vascular calcifications of penis additional calcification noted towards the glans penis possibly related to the foreskin though motion artifact degrades imaging quality. Other: No abdominopelvic free air or fluid. No bowel containing hernia. Musculoskeletal: Multilevel degenerative changes are present in the imaged portions of the spine. Straightening of normal lumbar lordosis. Additional degenerative changes in the hips and pelvis including partial bony fusion across the left SI joint. No acute osseous abnormality or suspicious osseous lesion. IMPRESSION: 1. Worsening thick-walled cavitary changes throughout the right hemithorax now involving large portions of the right middle lower lobe as well. Layering air-fluid levels are seen towards the right lung apex and within additional cavitary foci towards the lung base. Additional new widespread areas of bilateral centrilobular predominant consolidation and ground-glass opacity throughout the aerated portions of both lungs, some of which appears to demonstrate early central cavitation as well. Findings are concerning for worsening multifocal necrotizing pulmonary infection. 2. Redemonstrated mediastinal and hilar adenopathy, nonspecific though much of which is similar to decreased in size from prior. 3. Small pericardial effusion. 4. Air and fluid-filled loops of small bowel throughout the abdomen and pelvis without frank distention at  this time, overall nonspecific in appearance but could reflect an enteritis. 5. Borderline prostatomegaly with indentation of the bladder base. Correlate for clinical symptoms of outlet obstruction. 6. Aortic Atherosclerosis (ICD10-I70.0). Electronically Signed   By: Lovena Le M.D.   On: 08/02/2020 03:18   DG Chest Portable 1 View  Result Date: 08/01/2020 CLINICAL DATA:  Altered mental status. EXAM: PORTABLE CHEST 1 VIEW COMPARISON:  Radiograph 07/24/2020.  CT 03/11/2020 FINDINGS: Chronic volume loss in the right hemithorax. Chronic bronchiectasis and scarring right upper lobe. Previous air-fluid level within right apical cavity is not as well visualized. There is increasing patchy opacities throughout the right hemithorax. Blunting of the right costophrenic angle may be due to pleural effusion or volume loss. There is worsening airspace disease in the left mid lower lung zone. No pneumothorax. Left costophrenic angle excluded from the field of view, patient had difficulty tolerating the exam IMPRESSION: 1. Worsening airspace disease throughout the right hemithorax and left mid and lower lung zone, suspicious for pneumonia, including atypical infection.  2. Chronic volume loss in the right hemithorax with bronchiectasis and scarring right upper lobe. Previous air-fluid level within the right apical cavity is not as well visualized. Electronically Signed   By: Keith Rake M.D.   On: 08/01/2020 23:59   CT Renal Stone Study  Result Date: 08/02/2020 CLINICAL DATA:  Recently diagnosed with lung infection, prescribed antibiotics without improvement, altered behavior, renal parenchymal disease hematuria EXAM: CT CHEST, ABDOMEN AND PELVIS WITHOUT CONTRAST TECHNIQUE: Multidetector CT imaging of the chest, abdomen and pelvis was performed following the standard protocol without IV contrast. COMPARISON:  CT chest, abdomen and pelvis 03/12/2020, radiograph 08/01/2020 FINDINGS: CT CHEST FINDINGS Cardiovascular:  Normal cardiac size. Calcifications upon the chordae tendinae. Coronary artery calcifications are present. Small pericardial effusion, new from prior. Atherosclerotic plaque within the normal caliber aorta. No hyperdense mural thickening or plaque displacement. No periaortic stranding or hemorrhage. Normal 3 vessel branching of the aortic arch with minimal plaque in the proximal great vessels. Central arteries top-normal caliber. Luminal evaluation of the vasculature precluded in the absence of contrast media Mediastinum/Nodes: Redemonstrated mediastinal and hilar adenopathy. Index right paratracheal node measures 9 mm short axis, previously 12 mm. Additional paratracheal and subcarinal nodal nodes are similar to decreased in size. No acute abnormality of the esophagus. Trachea is patent. Thyroid gland and thoracic inlet are unremarkable. Lungs/Pleura: Extensive bronchiectatic changes with circumferential peribronchial opacity throughout the right hemithorax with more mild airways thickening left. Worsening cavitary changes in the right upper lobe now involving large portions of the right middle and lower lobe as well. Layering air-fluid levels are seen towards the right lung apex and within additional cavitary foci towards lung base. Interspersed within the aerated portions of the right lung and extensively throughout the left lung are additional centrilobular predominant areas of consolidation and ground-glass opacity some of which appear to be demonstrating developing central cavitation as well. This superimposed mixed consolidative, ground-glass and cavitary process is entirely new from comparison portable radiography corresponds to worsening airspace disease on the chest radiograph. No convincing left pneumothorax or pleural effusion. Musculoskeletal: Multilevel degenerative changes are present in the imaged portions of the spine. Multilevel flowing anterior osteophytosis, compatible with features of diffuse  idiopathic skeletal hyperostosis (DISH). No acute or concerning osseous lesions. No clear involvement of the chest wall by the presumed infectious process in the right hemithorax. Asymmetric expansion the left chest cavity is similar to prior. CT ABDOMEN PELVIS FINDINGS Hepatobiliary: No visible focal liver lesion within limitations of this unenhanced CT. Normal liver attenuation. Smooth liver surface contour. Normal gallbladder and biliary tree. Pancreas: No pancreatic ductal dilatation or surrounding inflammatory changes. Spleen: Normal in size. No concerning splenic lesions. Adrenals/Urinary Tract: Normal adrenal glands. No significant perinephric stranding inflammation accounting for motion artifact kidneys are symmetric in size and normally located. No visible or contour deforming renal lesion. No urolithiasis or hydronephrosis. Urinary bladder is grossly unremarkable aside from indentation of bladder base by an enlarged prostate. Stomach/Bowel: Distal esophagus, stomach and duodenum are unremarkable. Normal sweep across the midline abdomen. Air and fluid-filled loops of small bowel throughout the abdomen and pelvis without frank distention at this time, overall nonspecific in appearance but could reflect an enteritis. Colon is largely decompressed with minimal formed stool at the rectal vault. No colonic thickening or dilatation. Normal noninflamed appendix in the right lower quadrant. No evidence of bowel obstruction. Vascular/Lymphatic: Atherosclerotic calcifications within the abdominal aorta and branch vessels. No aneurysm or ectasia. No enlarged abdominopelvic lymph nodes. Reproductive: Borderline prostatomegaly with  indentation of the bladder base. Dense calcification of the seminal vesicles, nonspecific, can be a senescent finding vascular calcifications of penis additional calcification noted towards the glans penis possibly related to the foreskin though motion artifact degrades imaging quality.  Other: No abdominopelvic free air or fluid. No bowel containing hernia. Musculoskeletal: Multilevel degenerative changes are present in the imaged portions of the spine. Straightening of normal lumbar lordosis. Additional degenerative changes in the hips and pelvis including partial bony fusion across the left SI joint. No acute osseous abnormality or suspicious osseous lesion. IMPRESSION: 1. Worsening thick-walled cavitary changes throughout the right hemithorax now involving large portions of the right middle lower lobe as well. Layering air-fluid levels are seen towards the right lung apex and within additional cavitary foci towards the lung base. Additional new widespread areas of bilateral centrilobular predominant consolidation and ground-glass opacity throughout the aerated portions of both lungs, some of which appears to demonstrate early central cavitation as well. Findings are concerning for worsening multifocal necrotizing pulmonary infection. 2. Redemonstrated mediastinal and hilar adenopathy, nonspecific though much of which is similar to decreased in size from prior. 3. Small pericardial effusion. 4. Air and fluid-filled loops of small bowel throughout the abdomen and pelvis without frank distention at this time, overall nonspecific in appearance but could reflect an enteritis. 5. Borderline prostatomegaly with indentation of the bladder base. Correlate for clinical symptoms of outlet obstruction. 6. Aortic Atherosclerosis (ICD10-I70.0). Electronically Signed   By: Lovena Le M.D.   On: 08/02/2020 03:18        Scheduled Meds: . Chlorhexidine Gluconate Cloth  6 each Topical Daily  . enoxaparin (LOVENOX) injection  40 mg Subcutaneous Daily  . folic acid  1 mg Intravenous Daily  . QUEtiapine  25 mg Oral QHS  . thiamine injection  100 mg Intravenous Daily   Continuous Infusions: . lactated ringers 125 mL/hr at 08/02/20 1750  . piperacillin-tazobactam (ZOSYN)  IV 3.375 g (08/03/20 0501)   . sodium chloride 500 mL (08/02/20 1333)  . vancomycin 750 mg (08/03/20 0501)     LOS: 1 day    Time spent: 30 minutes    Barb Merino, MD [facility] Pager [phone]

## 2020-08-03 NOTE — Progress Notes (Signed)
Initial Nutrition Assessment  DOCUMENTATION CODES:   Not applicable  INTERVENTION:   Ensure Enlive po TID, each supplement provides 350 kcal and 20 grams of protein  Magic cup TID with meals, each supplement provides 290 kcal and 9 grams of protein  MVI with minerals daily  NUTRITION DIAGNOSIS:   Inadequate oral intake related to poor appetite as evidenced by per patient/family report.    GOAL:   Patient will meet greater than or equal to 90% of their needs    MONITOR:   PO intake,Supplement acceptance,Labs,Weight trends,I & O's  REASON FOR ASSESSMENT:   Consult Assessment of nutrition requirement/status  ASSESSMENT:   Pt admitted with severe sepsis 2/2 multifocal cavitary pneumonia. 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  Had been following intermittently with Dr. Roxan Hockey with [facility]. PMH also includes FTT and EtOH abuse.   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  Pt's wife reports pt has been essentially bedbound for the last 2 weeks and also reports pt has had poor po intake for the past few days and unintentional weight loss for the last month. This weight loss cannot be verified with available weight readings which make weight appear stable (see below).   No PO intake documented. Will order oral nutrition supplements for pt given reports of poor po intake.   UOP: 2.2L x24 hours   Medications reviewed and include: folic acid, thiamine Labs reviewed: Na 132 (L)   NUTRITION - FOCUSED PHYSICAL EXAM:  Unable to perform at this time as RD is working remotely from another campus. Will attempt at follow-up.   Diet Order:   Diet Order            Diet regular Room service appropriate? Yes; Fluid consistency: Thin  Diet effective now                 EDUCATION NEEDS:    No education needs have been identified at this time  Skin:  Skin Assessment: Reviewed RN Assessment  Last BM:  PTA  Height:   Ht Readings from Last 1 Encounters:  08/01/20 5\' 9"  (1.753 m)    Weight:   Wt Readings from Last 10 Encounters:  08/01/20 61.5 kg  05/22/20 61.5 kg  03/11/20 64.6 kg  08/02/18 62.9 kg  07/05/18 59.1 kg  04/12/18 62.4 kg  03/16/18 62.5 kg  03/03/18 62.5 kg  03/03/18 63 kg   BMI:  Body mass index is 20.02 kg/m.  Estimated Nutritional Needs:   Kcal:  1850-2050  Protein:  90-100 grams  Fluid:  >1.9L    Larkin Ina, MS, RD, LDN RD pager number and weekend/on-call pager number located in [place].

## 2020-08-03 NOTE — Progress Notes (Signed)
NAME:  Derrick Beltran, MRN:  [id number], DOB:  Jun 05, 1960, LOS: 1 ADMISSION DATE:  08/01/2020, CONSULTATION DATE:  08/02/20 REFERRING MD:  Derrick Beltran, CHIEF COMPLAINT:  AMS   Brief History   60 year old man with hx of incompletely treated prevotella pneumonia in 2019, FTT, alcohol abuse, noncompliance, recurrent AMA presenting with multifocal cavitary pneumonia.  History of present illness   60 year old man with PMH below presenting on insistence of his wife for failure to thrive, worsening SOB, cough, and weakness.  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  Had been following intermittently with Dr. Roxan Hockey with [facility].  History per his wife as patient is pretty altered in ER, unable to answer questions.  Has been having worsening breathing/coughing up "nasty stuff" for 5 months. Has been essentially bedbound for past 2 weeks only getting up to go to bathroom. Has been abstinent from cigarettes and alcohol for 6 weeks because he has been feeling so lousy. She does not think he chokes on food or has been having any fevers.  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 CT head neg CT A/P: question enteritis, question bladder wall thickening UA neg  Micro Data:  Sputum>> COVID/flu neg  Antimicrobials:  Vanc 3/10>> Zosyn 3/10>>   Interim history/subjective:  Remains encephalopathic. On RA.  Objective   Blood pressure 129/72, pulse 100, temperature 98.8 F (37.1  C), temperature source Axillary, resp. rate 17, height 5\' 9"  (1.753 m), weight 61.5 kg, SpO2 100 %.        Intake/Output Summary (Last 24 hours) at 08/03/2020 1344 Last data filed at 08/03/2020 0300 Gross per 24 hour  Intake 3417.26 ml  Output 2200 ml  Net 1217.26 ml   Filed Weights   08/01/20 2323  Weight: 61.5 kg    Examination: Constitutional: chronically ill appearing man writhing around in bed  Eyes: EOMI, tracking Ears, nose, mouth, and throat: MM dry, trachea midline Cardiovascular: RRR, ext warm Respiratory: scattered rhonci, RR normal Gastrointestinal: soft, +BS Skin: No rashes, normal turgor Neurologic: moves all 4 ext to command Psychiatric: confused but more awake  Cr improved WBC improved Sputum pending MRSA swab neg  Resolved Hospital Problem list   n/a  Assessment & Plan:  Severe sepsis secondary to multifocal cavitary pneumonia- this seems c/w smoldering anaerobic infection, likely the prevotella - f/u sputum culture - d/c vanc, continue zosyn, switch to augmentin when can tolerate PO and nearing readiness for DC - He needs at least a month of augmentin and OP imaging f/u either with pulm or [facility];  - No indication for bronch unless languishes, radiographic improvement will take at least 2-3 weeks  Hx recurrent AMA, cachexia, FTT- would Rx him augmentin if he tries to leave again, currently he does not have capacity to make this decision nor the strength to leave again.  I told wife on 3/11 he will likely die in next month or two if he does not get this infection adequately treated and improve his nutrition.  Acute metabolic encephalopathy- septic vs. question of Wernicke's given malnutrition and heavy EtOH hx AKI- prerenal/ATN improving - IV hydration - PRN antipsychotics - High dose thiamine - Restart PTA trazodone    Best practice:  Diet: NPO, consider cortrak Pain/Anxiety/Delirium protocol (if indicated): see above VAP protocol (if indicated):  n/a DVT prophylaxis: lovenox GI prophylaxis: n/a Glucose control: n/a Mobility: BR Code Status: full Family Communication: per primary Disposition: per TRH  Will arrange OP pulmonary f/u in 4 weeks, please call if we can be of further assistance  Erskine Emery MD PCCM

## 2020-08-04 LAB — CBC WITH DIFFERENTIAL/PLATELET
Abs Immature Granulocytes: 0.18 10*3/uL — ABNORMAL HIGH (ref 0.00–0.07)
Basophils Absolute: 0 10*3/uL (ref 0.0–0.1)
Basophils Relative: 0 %
Eosinophils Absolute: 0 10*3/uL (ref 0.0–0.5)
Eosinophils Relative: 0 %
HCT: 34.9 % — ABNORMAL LOW (ref 39.0–52.0)
Hemoglobin: 11.3 g/dL — ABNORMAL LOW (ref 13.0–17.0)
Immature Granulocytes: 2 %
Lymphocytes Relative: 12 %
Lymphs Abs: 1.2 10*3/uL (ref 0.7–4.0)
MCH: 28.9 pg (ref 26.0–34.0)
MCHC: 32.4 g/dL (ref 30.0–36.0)
MCV: 89.3 fL (ref 80.0–100.0)
Monocytes Absolute: 1.1 10*3/uL — ABNORMAL HIGH (ref 0.1–1.0)
Monocytes Relative: 11 %
Neutro Abs: 7.5 10*3/uL (ref 1.7–7.7)
Neutrophils Relative %: 75 %
Platelets: 362 10*3/uL (ref 150–400)
RBC: 3.91 MIL/uL — ABNORMAL LOW (ref 4.22–5.81)
RDW: 13.9 % (ref 11.5–15.5)
WBC: 10 10*3/uL (ref 4.0–10.5)
nRBC: 0 % (ref 0.0–0.2)

## 2020-08-04 LAB — BASIC METABOLIC PANEL
Anion gap: 9 (ref 5–15)
BUN: 17 mg/dL (ref 6–20)
CO2: 27 mmol/L (ref 22–32)
Calcium: 8.9 mg/dL (ref 8.9–10.3)
Chloride: 103 mmol/L (ref 98–111)
Creatinine, Ser: 0.85 mg/dL (ref 0.61–1.24)
GFR, Estimated: >60 mL/min (ref >60)
Glucose, Bld: 90 mg/dL (ref 70–99)
Potassium: 3.4 mmol/L — ABNORMAL LOW (ref 3.5–5.1)
Sodium: 139 mmol/L (ref 135–145)

## 2020-08-04 LAB — LEGIONELLA PNEUMOPHILA SEROGP 1 UR AG: L. pneumophila Serogp 1 Ur Ag: NEGATIVE

## 2020-08-04 LAB — MAGNESIUM: Magnesium: 1.6 mg/dL — ABNORMAL LOW (ref 1.7–2.4)

## 2020-08-04 LAB — PHOSPHORUS: Phosphorus: 3 mg/dL (ref 2.5–4.6)

## 2020-08-04 MED ORDER — MAGNESIUM OXIDE 400 (241.3 MG) MG PO TABS
400.0000 mg | ORAL_TABLET | Freq: Two times a day (BID) | ORAL | Status: DC
  Administered 2020-08-04 – 2020-08-12 (×16): 400 mg via ORAL
  Filled 2020-08-04 (×18): qty 1

## 2020-08-04 MED ORDER — QUETIAPINE FUMARATE 25 MG PO TABS
25.0000 mg | ORAL_TABLET | Freq: Two times a day (BID) | ORAL | Status: DC
  Administered 2020-08-04 – 2020-08-05 (×2): 25 mg via ORAL
  Filled 2020-08-04 (×2): qty 1

## 2020-08-04 MED ORDER — MAGNESIUM SULFATE 2 GM/50ML IV SOLN
2.0000 g | Freq: Once | INTRAVENOUS | Status: AC
  Administered 2020-08-04: 2 g via INTRAVENOUS
  Filled 2020-08-04: qty 50

## 2020-08-04 MED ORDER — POTASSIUM CHLORIDE CRYS ER 20 MEQ PO TBCR
40.0000 meq | EXTENDED_RELEASE_TABLET | Freq: Two times a day (BID) | ORAL | Status: AC
  Administered 2020-08-04 – 2020-08-05 (×3): 40 meq via ORAL
  Filled 2020-08-04 (×4): qty 2

## 2020-08-04 MED ORDER — SODIUM CHLORIDE 0.9 % IV BOLUS
1000.0000 mL | Freq: Once | INTRAVENOUS | Status: AC
  Administered 2020-08-04: 1000 mL via INTRAVENOUS

## 2020-08-04 NOTE — Progress Notes (Signed)
Sputum culture obtained, NTS, productive cough.

## 2020-08-04 NOTE — Progress Notes (Signed)
PROGRESS NOTE    Derrick Beltran  MGQ:[id number] DOB: [birth year] DOA: 08/01/2020 PCP: Denyce Robert, FNP    Brief Narrative:  60 year old gentleman with history of poor compliance, recurrent pneumonia and empyema who was recently diagnosed with another bout of pneumonia and treated with Bactrim.  Patient has been getting more agitated and not taking medications, not eating well and losing weight so wife brought patient to the hospital.  In the emergency room, he was on room air.  Chest x-ray showed multifocal pneumonia with cavitary lesions.  CT head normal.  Patient was combative.  Was given Haldol and Geodon.  CT scan shows extensive scarring and multifocal  of pneumonia.   Assessment & Plan:   Principal Problem:   Multifocal pneumonia Active Problems:   Hyponatremia   Tobacco use   Necrotizing pneumonia ([place])   Unintentional weight loss   Sepsis ([place])   Acute metabolic encephalopathy   AKI (acute kidney injury) ([place])   Leukocytosis  Multifocal pneumonia: Acute on chronic pneumonia with structural changes on his lungs.  Previous history of Prevotella infection. Currently on room air. Patient was on vancomycin and Zosyn.  Unlikely MRSA.  Currently on Zosyn. Patient is altered and not participating on chest physiotherapy. Will start deep breathing exercises, incentive spirometry and flutter valve therapy as much he is able to do it.  Followed by PCCM.  Recommended at least 1 month of anaerobic coverage.  Will change to oral antibiotic on discharge.  Will change to Augmentin on discharge when patient is more stable.  Acute metabolic encephalopathy/confusion and delirium: Previous alcoholic.  No reported alcohol consumption for 1-1/2 months.  This is probably multifactorial.  CT head was normal. O67 and folic acid normal.  TSH level normal.  B1 level pending.  With previous history of heavy alcoholism, will treat with high-dose of thiamine 500 mg 3 times a day for 5 days can maintenance  thiamine. All-time fall precautions.  Delirium precautions. 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 Responded well to Seroquel at night, will change to twice a day dosing. Use as needed soft restraints to prevent from fall and pulling on lines and prevent injuries.   DVT prophylaxis: enoxaparin (LOVENOX) injection 40 mg Start: 08/02/20 1000   Code Status: Full code Family Communication: Wife on the phone 3/12 Disposition Plan: Status is: Inpatient  Remains inpatient appropriate because:IV treatments appropriate due to intensity of illness or inability to take PO and Inpatient level of care appropriate due to severity of illness   Dispo: The patient is from: Home              Anticipated d/c is to: Home              Patient currently is not medically stable to d/c.   Difficult to place patient No         Consultants:   PCCM  Procedures:   None  Antimicrobials:  Antibiotics Given (last 72 hours)    Date/Time Action Medication Dose Rate   08/02/20 0013 New Bag/Given   cefTRIAXone (ROCEPHIN) 2 g in sodium chloride 0.9 % 100 mL IVPB 2 g 200 mL/hr   08/02/20 0019 New Bag/Given   azithromycin (ZITHROMAX) 500 mg in sodium chloride 0.9 % 250 mL IVPB 500 mg 250 mL/hr   08/02/20 0144 New Bag/Given   vancomycin (VANCOREADY) IVPB 1250 mg/250 mL 1,250 mg 166.7 mL/hr   08/02/20 1050 New Bag/Given   piperacillin-tazobactam (ZOSYN) IVPB 3.375 g 3.375 g 12.5 mL/hr  08/02/20 2300 New Bag/Given   piperacillin-tazobactam (ZOSYN) IVPB 3.375 g 3.375 g 12.5 mL/hr   08/03/20 0501 New Bag/Given   vancomycin (VANCOREADY) IVPB 750 mg/150 mL 750 mg 150 mL/hr   08/03/20 0501 New Bag/Given   piperacillin-tazobactam (ZOSYN) IVPB 3.375 g 3.375 g 12.5 mL/hr   08/03/20 1317 New Bag/Given   piperacillin-tazobactam (ZOSYN) IVPB 3.375 g 3.375 g 12.5 mL/hr   08/03/20 2211 New Bag/Given   piperacillin-tazobactam (ZOSYN) IVPB 3.375 g 3.375 g 12.5 mL/hr   08/04/20 0531  New Bag/Given   piperacillin-tazobactam (ZOSYN) IVPB 3.375 g 3.375 g 12.5 mL/hr         Subjective: Patient seen and examined.  No overnight events.  He was occasionally agitated and irritated and wanting to get out of bed.  He is on soft restraints.  On my examination, he is sleepy, mumbles some words otherwise no obvious problem.  He is visibly coughing with any movements.  Objective: Vitals:   08/04/20 0336 08/04/20 0410 08/04/20 0749 08/04/20 1210  BP: (!) 150/96 135/83 120/78 (!) 88/61  Pulse: (!) 107  100   Resp:  17 18 (!) 22  Temp: 97.6 F (36.4 C)  97.8 F (36.6 C)   TempSrc: Oral  Axillary   SpO2: 100% 100% 100% 100%  Weight:      Height:        Intake/Output Summary (Last 24 hours) at 08/04/2020 1333 Last data filed at 08/04/2020 0600 Gross per 24 hour  Intake 1605.86 ml  Output 1500 ml  Net 105.86 ml   Filed Weights   08/01/20 2323  Weight: 61.5 kg    Examination:  General exam: Appears frail and debilitated. Patient is alert and awake and oriented x1.  He is delirious and impulsive and agitated at times. Respiratory system: Mostly conducted airway sounds. Cardiovascular system: S1 & S2 heard, RRR.  Gastrointestinal system: Abdomen is nondistended, soft and nontender. No organomegaly or masses felt. Normal bowel sounds heard. Extremities: Symmetric 5 x 5 power.    Data Reviewed: I have personally reviewed following labs and imaging studies  CBC: Recent Labs  Lab 08/01/20 2341 08/02/20 0630 08/04/20 0453  WBC 26.1* 18.8* 10.0  NEUTROABS 23.2*  --  7.5  HGB 14.5 11.1* 11.3*  HCT 43.1 34.2* 34.9*  MCV 88.0 88.4 89.3  PLT 590* 359 607   Basic Metabolic Panel: Recent Labs  Lab 08/01/20 2340 08/02/20 0630 08/04/20 0453  NA 130* 132* 139  K 5.0 4.1 3.4*  CL 90* 96* 103  CO2 23 23 27   GLUCOSE 173* 124* 90  BUN 84* 72* 17  CREATININE 2.08* 1.60* 0.85  CALCIUM 9.6 8.3* 8.9  MG  --   --  1.6*  PHOS  --   --  3.0   GFR: Estimated  Creatinine Clearance: 81.4 mL/min (by C-G formula based on SCr of 0.85 mg/dL). Liver Function Tests: Recent Labs  Lab 08/01/20 2340  AST 26  ALT 15  ALKPHOS 62  BILITOT 0.7  PROT 8.0  ALBUMIN 2.0*   No results for input(s): LIPASE, AMYLASE in the last 168 hours. No results for input(s): AMMONIA in the last 168 hours. Coagulation Profile: Recent Labs  Lab 08/01/20 2348 08/03/20 0451  INR 1.3* 1.3*   Cardiac Enzymes: No results for input(s): CKTOTAL, CKMB, CKMBINDEX, TROPONINI in the last 168 hours. BNP (last 3 results) No results for input(s): PROBNP in the last 8760 hours. HbA1C: No results for input(s): HGBA1C in the last 72 hours. CBG: No  results for input(s): GLUCAP in the last 168 hours. Lipid Profile: No results for input(s): CHOL, HDL, LDLCALC, TRIG, CHOLHDL, LDLDIRECT in the last 72 hours. Thyroid Function Tests: Recent Labs    08/03/20 0946  TSH 2.514   Anemia Panel: Recent Labs    08/03/20 0946  VITAMINB12 727  FOLATE 12.4   Sepsis Labs: Recent Labs  Lab 08/02/20 0300 08/02/20 0630 08/02/20 1115 08/02/20 1452  PROCALCITON  --  2.45  --   --   LATICACIDVEN 4.5* 2.1* 2.0* 1.6    Recent Results (from the past 240 hour(s))  Resp Panel by RT-PCR (Flu A&B, Covid) Nasopharyngeal Swab     Status: None   Collection Time: 08/01/20  3:50 AM   Specimen: Nasopharyngeal Swab; Nasopharyngeal(NP) swabs in vial transport medium  Result Value Ref Range Status   SARS Coronavirus 2 by RT PCR NEGATIVE NEGATIVE Final    Comment: (NOTE) SARS-CoV-2 target nucleic acids are NOT DETECTED.  The SARS-CoV-2 RNA is generally detectable in upper respiratory specimens during the acute phase of infection. The lowest concentration of SARS-CoV-2 viral copies this assay can detect is 138 copies/mL. A negative result does not preclude SARS-Cov-2 infection and should not be used as the sole basis for treatment or other patient management decisions. A negative result may occur  with  improper specimen collection/handling, submission of specimen other than nasopharyngeal swab, presence of viral mutation(s) within the areas targeted by this assay, and inadequate number of viral copies(<138 copies/mL). A negative result must be combined with clinical observations, patient history, and epidemiological information. The expected result is Negative.  Fact Sheet for Patients:  EntrepreneurPulse.com.au  Fact Sheet for Healthcare Providers:  IncredibleEmployment.be  This test is no t yet approved or cleared by the [place] FDA and  has been authorized for detection and/or diagnosis of SARS-CoV-2 by FDA under an Emergency Use Authorization (EUA). This EUA will remain  in effect (meaning this test can be used) for the duration of the COVID-19 declaration under Section 564(b)(1) of the Act, 21 U.S.C.section 360bbb-3(b)(1), unless the authorization is terminated  or revoked sooner.       Influenza A by PCR NEGATIVE NEGATIVE Final   Influenza B by PCR NEGATIVE NEGATIVE Final    Comment: (NOTE) The Xpert Xpress SARS-CoV-2/FLU/RSV plus assay is intended as an aid in the diagnosis of influenza from Nasopharyngeal swab specimens and should not be used as a sole basis for treatment. Nasal washings and aspirates are unacceptable for Xpert Xpress SARS-CoV-2/FLU/RSV testing.  Fact Sheet for Patients: EntrepreneurPulse.com.au  Fact Sheet for Healthcare Providers: IncredibleEmployment.be  This test is not yet approved or cleared by the [place] FDA and has been authorized for detection and/or diagnosis of SARS-CoV-2 by FDA under an Emergency Use Authorization (EUA). This EUA will remain in effect (meaning this test can be used) for the duration of the COVID-19 declaration under Section 564(b)(1) of the Act, 21 U.S.C. section 360bbb-3(b)(1), unless the authorization is terminated  or revoked.  Performed at [facility], [place] [address], [place], [place] [address]   Culture, blood (Routine x 2)     Status: None (Preliminary result)   Collection Time: 08/01/20 11:25 PM   Specimen: BLOOD LEFT ARM  Result Value Ref Range Status   Specimen Description BLOOD LEFT ARM  Final   Special Requests   Final    BOTTLES DRAWN AEROBIC AND ANAEROBIC Blood Culture adequate volume   Culture   Final    NO GROWTH 1  DAY Performed at [facility], [place] [address], [place], [place] [address]    Report Status PENDING  Incomplete  Culture, blood (Routine x 2)     Status: None (Preliminary result)   Collection Time: 08/01/20 11:42 PM   Specimen: BLOOD RIGHT ARM  Result Value Ref Range Status   Specimen Description BLOOD RIGHT ARM  Final   Special Requests AEROBIC BOTTLE ONLY Blood Culture adequate volume  Final   Culture   Final    NO GROWTH 1 DAY Performed at [facility], [place] [address], [place], [place] [address]    Report Status PENDING  Incomplete  Urine culture     Status: None   Collection Time: 08/02/20  4:23 AM   Specimen: In/Out Cath Urine  Result Value Ref Range Status   Specimen Description IN/OUT CATH URINE  Final   Special Requests NONE  Final   Culture   Final    NO GROWTH Performed at [facility], [place] [address] [place], [place] [address]    Report Status 08/03/2020 FINAL  Final  MRSA PCR Screening     Status: None   Collection Time: 08/02/20 11:07 AM   Specimen: Nasopharyngeal  Result Value Ref Range Status   MRSA by PCR NEGATIVE NEGATIVE Final    Comment:        The GeneXpert MRSA Assay (FDA approved for NASAL specimens only), is one component of a comprehensive MRSA colonization surveillance program. It is not intended to diagnose MRSA infection nor to guide or monitor treatment for MRSA infections. Performed at [facility], [place] [address] [place], [place] [address]          Radiology Studies: No results  found.      Scheduled Meds: . Chlorhexidine Gluconate Cloth  6 each Topical Daily  . enoxaparin (LOVENOX) injection  40 mg Subcutaneous Daily  . feeding supplement  237 mL Oral TID BM  . folic acid  1 mg Intravenous Daily  . magnesium oxide  400 mg Oral BID  . multivitamin with minerals  1 tablet Oral Daily  . potassium chloride  40 mEq Oral BID  . QUEtiapine  25 mg Oral BID  . traZODone  100 mg Oral QHS   Continuous Infusions: . piperacillin-tazobactam (ZOSYN)  IV 3.375 g (08/04/20 0531)  . sodium chloride 500 mL (08/02/20 1333)  . thiamine injection 500 mg (08/04/20 0721)     LOS: 2 days    Time spent: 30 minutes    Barb Merino, MD [facility] Pager [phone]

## 2020-08-04 NOTE — Plan of Care (Signed)

## 2020-08-05 ENCOUNTER — Telehealth: Payer: Self-pay

## 2020-08-05 LAB — GLUCOSE, CAPILLARY
Glucose-Capillary: 90 mg/dL (ref 70–99)
Glucose-Capillary: 122 mg/dL — ABNORMAL HIGH (ref 70–99)
Glucose-Capillary: 95 mg/dL (ref 70–99)
Glucose-Capillary: 73 mg/dL (ref 70–99)

## 2020-08-05 MED ORDER — LORAZEPAM 2 MG/ML IJ SOLN
1.0000 mg | Freq: Once | INTRAMUSCULAR | Status: AC
  Administered 2020-08-06: 1 mg via INTRAVENOUS
  Filled 2020-08-05: qty 1

## 2020-08-05 MED ORDER — QUETIAPINE FUMARATE 25 MG PO TABS
25.0000 mg | ORAL_TABLET | Freq: Every day | ORAL | Status: DC
  Administered 2020-08-06: 25 mg via ORAL
  Filled 2020-08-05: qty 1

## 2020-08-05 MED ORDER — LORAZEPAM 2 MG/ML IJ SOLN: 1.0000 mg | Freq: Once | INTRAMUSCULAR | Status: DC

## 2020-08-05 MED ORDER — QUETIAPINE FUMARATE 50 MG PO TABS
50.0000 mg | ORAL_TABLET | Freq: Every day | ORAL | Status: DC
  Filled 2020-08-05: qty 1

## 2020-08-05 NOTE — Progress Notes (Signed)
PROGRESS NOTE    Derrick Beltran  BSW:[id number] DOB: [birth year] DOA: 08/01/2020 PCP: Denyce Robert, FNP    Brief Narrative:  60 year old gentleman with history of poor compliance, recurrent pneumonia and empyema who was recently diagnosed with another bout of pneumonia and treated with Bactrim.  Patient has been getting more agitated and not taking medications, not eating well and losing weight so wife brought patient to the hospital.  In the emergency room, he was on room air.  Chest x-ray showed multifocal pneumonia with cavitary lesions.  CT head normal.  Patient was combative.  Was given Haldol and Geodon.  CT scan shows extensive scarring and multifocal pneumonia.   Assessment & Plan:   Principal Problem:   Multifocal pneumonia Active Problems:   Hyponatremia   Tobacco use   Necrotizing pneumonia ([place])   Unintentional weight loss   Sepsis ([place])   Acute metabolic encephalopathy   AKI (acute kidney injury) ([place])   Leukocytosis  Multifocal pneumonia: Acute on chronic pneumonia with structural changes on his lungs.  Previous history of Prevotella infection. Currently on room air. Patient was on vancomycin and Zosyn.  Unlikely MRSA.  Currently on Zosyn. Patient is altered and not participating on chest physiotherapy. Will start deep breathing exercises, incentive spirometry and flutter valve therapy as much he is able to do it. Followed by PCCM.  Recommended at least 1 month of anaerobic coverage.  Will change to oral antibiotic on discharge.  Will change to Augmentin on discharge when patient is more stable.  Acute metabolic encephalopathy/confusion and delirium: Previous alcoholic.  No reported alcohol consumption for 1-1/2 months.  This is probably multifactorial.  CT head was normal. G66 and folic acid normal.  TSH level normal.  B1 level pending.  With previous history of heavy alcoholism, treating with high dose of thiamine.   All-time fall precautions.  Delirium  precautions. 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 Seroquel 25 mg twice a day.  We will see response and go up on doses. Use as needed soft restraints to prevent from fall and pulling on lines and prevent injuries. MRI of the brain today to rule out any infections or stroke.  No focal deficits. Psychiatry consult today for medication management and evaluation.  Hypokalemia/hypomagnesemia: Replaced aggressively.  We will recheck tomorrow.   DVT prophylaxis: enoxaparin (LOVENOX) injection 40 mg Start: 08/02/20 1000   Code Status: Full code Family Communication: Wife and sister at bedside 3/13. Disposition Plan: Status is: Inpatient  Remains inpatient appropriate because:IV treatments appropriate due to intensity of illness or inability to take PO and Inpatient level of care appropriate due to severity of illness   Dispo: The patient is from: Home              Anticipated d/c is to: Home              Patient currently is not medically stable to d/c.   Difficult to place patient No         Consultants:   PCCM  Psychiatry  Procedures:   None  Antimicrobials:  Antibiotics Given (last 72 hours)    Date/Time Action Medication Dose Rate   08/02/20 2300 New Bag/Given   piperacillin-tazobactam (ZOSYN) IVPB 3.375 g 3.375 g 12.5 mL/hr   08/03/20 0501 New Bag/Given   vancomycin (VANCOREADY) IVPB 750 mg/150 mL 750 mg 150 mL/hr   08/03/20 0501 New Bag/Given   piperacillin-tazobactam (ZOSYN) IVPB 3.375 g 3.375 g 12.5 mL/hr   08/03/20 1317 New Bag/Given  piperacillin-tazobactam (ZOSYN) IVPB 3.375 g 3.375 g 12.5 mL/hr   08/03/20 2211 New Bag/Given   piperacillin-tazobactam (ZOSYN) IVPB 3.375 g 3.375 g 12.5 mL/hr   08/04/20 0531 New Bag/Given   piperacillin-tazobactam (ZOSYN) IVPB 3.375 g 3.375 g 12.5 mL/hr   08/04/20 1535 New Bag/Given   piperacillin-tazobactam (ZOSYN) IVPB 3.375 g 3.375 g 12.5 mL/hr   08/04/20 2125 New Bag/Given    piperacillin-tazobactam (ZOSYN) IVPB 3.375 g 3.375 g 12.5 mL/hr   08/05/20 0548 New Bag/Given   piperacillin-tazobactam (ZOSYN) IVPB 3.375 g 3.375 g 12.5 mL/hr         Subjective: Patient seen and examined.  Overnight he had some agitations and impulsiveness.  Patient is on restraints, Posey vest.  He is occasionally coughing. Today he was able to answer his name and date of birth.  He does occasionally responds appropriately but remains restless and trying to climb out of the bed.  Afebrile.  On room air.  Unable to participate on therapies.  Objective: Vitals:   08/04/20 2054 08/04/20 2355 08/05/20 0330 08/05/20 0834  BP: 110/64 99/69 124/86 (!) 137/91  Pulse: 100 (!) 108 (!) 105 (!) 103  Resp: 20 20 20    Temp: 97.6 F (36.4 C) 97.8 F (36.6 C) 98.6 F (37 C) 97.6 F (36.4 C)  TempSrc: Axillary Axillary  Axillary  SpO2: 100% 100% 90%   Weight:      Height:        Intake/Output Summary (Last 24 hours) at 08/05/2020 1112 Last data filed at 08/05/2020 1000 Gross per 24 hour  Intake 1060 ml  Output 1080 ml  Net -20 ml   Filed Weights   08/01/20 2323  Weight: 61.5 kg    Examination:  General exam: Appears frail and debilitated. Patient is alert and awake and oriented x 1-2.  He is delirious and impulsive and agitated at times. Respiratory system: Mostly conducted airway sounds. Cardiovascular system: S1 & S2 heard, RRR.  Gastrointestinal system: Abdomen is nondistended, soft and nontender. No organomegaly or masses felt. Normal bowel sounds heard. Extremities: Symmetric 5 x 5 power.  Moving all extremities.    Data Reviewed: I have personally reviewed following labs and imaging studies  CBC: Recent Labs  Lab 08/01/20 2341 08/02/20 0630 08/04/20 0453  WBC 26.1* 18.8* 10.0  NEUTROABS 23.2*  --  7.5  HGB 14.5 11.1* 11.3*  HCT 43.1 34.2* 34.9*  MCV 88.0 88.4 89.3  PLT 590* 359 086   Basic Metabolic Panel: Recent Labs  Lab 08/01/20 2340 08/02/20 0630  08/04/20 0453  NA 130* 132* 139  K 5.0 4.1 3.4*  CL 90* 96* 103  CO2 23 23 27   GLUCOSE 173* 124* 90  BUN 84* 72* 17  CREATININE 2.08* 1.60* 0.85  CALCIUM 9.6 8.3* 8.9  MG  --   --  1.6*  PHOS  --   --  3.0   GFR: Estimated Creatinine Clearance: 81.4 mL/min (by C-G formula based on SCr of 0.85 mg/dL). Liver Function Tests: Recent Labs  Lab 08/01/20 2340  AST 26  ALT 15  ALKPHOS 62  BILITOT 0.7  PROT 8.0  ALBUMIN 2.0*   No results for input(s): LIPASE, AMYLASE in the last 168 hours. No results for input(s): AMMONIA in the last 168 hours. Coagulation Profile: Recent Labs  Lab 08/01/20 2348 08/03/20 0451  INR 1.3* 1.3*   Cardiac Enzymes: No results for input(s): CKTOTAL, CKMB, CKMBINDEX, TROPONINI in the last 168 hours. BNP (last 3 results) No results for input(s):  PROBNP in the last 8760 hours. HbA1C: No results for input(s): HGBA1C in the last 72 hours. CBG: Recent Labs  Lab 08/05/20 0838  GLUCAP 90   Lipid Profile: No results for input(s): CHOL, HDL, LDLCALC, TRIG, CHOLHDL, LDLDIRECT in the last 72 hours. Thyroid Function Tests: Recent Labs    08/03/20 0946  TSH 2.514   Anemia Panel: Recent Labs    08/03/20 0946  VITAMINB12 727  FOLATE 12.4   Sepsis Labs: Recent Labs  Lab 08/02/20 0300 08/02/20 0630 08/02/20 1115 08/02/20 1452  PROCALCITON  --  2.45  --   --   LATICACIDVEN 4.5* 2.1* 2.0* 1.6    Recent Results (from the past 240 hour(s))  Resp Panel by RT-PCR (Flu A&B, Covid) Nasopharyngeal Swab     Status: None   Collection Time: 08/01/20  3:50 AM   Specimen: Nasopharyngeal Swab; Nasopharyngeal(NP) swabs in vial transport medium  Result Value Ref Range Status   SARS Coronavirus 2 by RT PCR NEGATIVE NEGATIVE Final    Comment: (NOTE) SARS-CoV-2 target nucleic acids are NOT DETECTED.  The SARS-CoV-2 RNA is generally detectable in upper respiratory specimens during the acute phase of infection. The lowest concentration of SARS-CoV-2 viral  copies this assay can detect is 138 copies/mL. A negative result does not preclude SARS-Cov-2 infection and should not be used as the sole basis for treatment or other patient management decisions. A negative result may occur with  improper specimen collection/handling, submission of specimen other than nasopharyngeal swab, presence of viral mutation(s) within the areas targeted by this assay, and inadequate number of viral copies(<138 copies/mL). A negative result must be combined with clinical observations, patient history, and epidemiological information. The expected result is Negative.  Fact Sheet for Patients:  EntrepreneurPulse.com.au  Fact Sheet for Healthcare Providers:  IncredibleEmployment.be  This test is no t yet approved or cleared by the [place] FDA and  has been authorized for detection and/or diagnosis of SARS-CoV-2 by FDA under an Emergency Use Authorization (EUA). This EUA will remain  in effect (meaning this test can be used) for the duration of the COVID-19 declaration under Section 564(b)(1) of the Act, 21 U.S.C.section 360bbb-3(b)(1), unless the authorization is terminated  or revoked sooner.       Influenza A by PCR NEGATIVE NEGATIVE Final   Influenza B by PCR NEGATIVE NEGATIVE Final    Comment: (NOTE) The Xpert Xpress SARS-CoV-2/FLU/RSV plus assay is intended as an aid in the diagnosis of influenza from Nasopharyngeal swab specimens and should not be used as a sole basis for treatment. Nasal washings and aspirates are unacceptable for Xpert Xpress SARS-CoV-2/FLU/RSV testing.  Fact Sheet for Patients: EntrepreneurPulse.com.au  Fact Sheet for Healthcare Providers: IncredibleEmployment.be  This test is not yet approved or cleared by the [place] FDA and has been authorized for detection and/or diagnosis of SARS-CoV-2 by FDA under an Emergency Use Authorization (EUA). This  EUA will remain in effect (meaning this test can be used) for the duration of the COVID-19 declaration under Section 564(b)(1) of the Act, 21 U.S.C. section 360bbb-3(b)(1), unless the authorization is terminated or revoked.  Performed at [facility], [place] [address] [place], [place] [address]   Culture, blood (Routine x 2)     Status: None (Preliminary result)   Collection Time: 08/01/20 11:25 PM   Specimen: BLOOD LEFT ARM  Result Value Ref Range Status   Specimen Description BLOOD LEFT ARM  Final   Special Requests   Final    BOTTLES  DRAWN AEROBIC AND ANAEROBIC Blood Culture adequate volume   Culture   Final    NO GROWTH 3 DAYS Performed at [facility], [place] [address] [place], [place] [address]    Report Status PENDING  Incomplete  Culture, blood (Routine x 2)     Status: None (Preliminary result)   Collection Time: 08/01/20 11:42 PM   Specimen: BLOOD RIGHT ARM  Result Value Ref Range Status   Specimen Description BLOOD RIGHT ARM  Final   Special Requests AEROBIC BOTTLE ONLY Blood Culture adequate volume  Final   Culture   Final    NO GROWTH 3 DAYS Performed at [facility], [place] [address] [place], [place] [address]    Report Status PENDING  Incomplete  Urine culture     Status: None   Collection Time: 08/02/20  4:23 AM   Specimen: In/Out Cath Urine  Result Value Ref Range Status   Specimen Description IN/OUT CATH URINE  Final   Special Requests NONE  Final   Culture   Final    NO GROWTH Performed at [facility], [place] [address], [place], [place] [address]    Report Status 08/03/2020 FINAL  Final  MRSA PCR Screening     Status: None   Collection Time: 08/02/20 11:07 AM   Specimen: Nasopharyngeal  Result Value Ref Range Status   MRSA by PCR NEGATIVE NEGATIVE Final    Comment:        The GeneXpert MRSA Assay (FDA approved for NASAL specimens only), is one component of a comprehensive MRSA colonization surveillance program. It is  not intended to diagnose MRSA infection nor to guide or monitor treatment for MRSA infections. Performed at [facility], [place] [address] [place], [place] [address]   Culture, Respiratory w Gram Stain     Status: None (Preliminary result)   Collection Time: 08/04/20 12:56 PM   Specimen: Tracheal Aspirate  Result Value Ref Range Status   Specimen Description TRACHEAL ASPIRATE  Final   Special Requests NONE  Final   Gram Stain   Final    FEW WBC PRESENT, PREDOMINANTLY PMN RARE GRAM POSITIVE COCCI IN PAIRS Performed at [facility], [place] [address] [place], [place] [address]    Culture PENDING  Incomplete   Report Status PENDING  Incomplete         Radiology Studies: No results found.      Scheduled Meds: . Chlorhexidine Gluconate Cloth  6 each Topical Daily  . enoxaparin (LOVENOX) injection  40 mg Subcutaneous Daily  . feeding supplement  237 mL Oral TID BM  . folic acid  1 mg Intravenous Daily  . LORazepam  1 mg Intravenous Once  . magnesium oxide  400 mg Oral BID  . multivitamin with minerals  1 tablet Oral Daily  . potassium chloride  40 mEq Oral BID  . QUEtiapine  25 mg Oral BID  . traZODone  100 mg Oral QHS   Continuous Infusions: . piperacillin-tazobactam (ZOSYN)  IV 3.375 g (08/05/20 0548)  . sodium chloride 500 mL (08/02/20 1333)  . thiamine injection 500 mg (08/05/20 0543)     LOS: 3 days    Time spent: 30 minutes    Barb Merino, MD [facility] Pager [phone]

## 2020-08-05 NOTE — Progress Notes (Signed)
Pharmacy Antibiotic Note   Derrick Beltran is a 60 y.o. male admitted on 08/01/2020 with necrotizing pneumonia .  Pharmacy has been consulted for Zosyn dosing. Not responding to outpatient PO bactrim. WBC improving and renal function improving.  Whne more clinically stable, plan is to switch to Augmentin and complete a month of antibiotics.  Plan: Continue Zosyn 3.375 gm IV q8hr Trend WBC, temp, renal function  F/U infectious work-up  Height: 5\' 9"  (175.3 cm) Weight: 61.5 kg (135 lb 9.3 oz) IBW/kg (Calculated) : 70.7  Temp (24hrs), Avg:97.7 F (36.5 C), Min:96.9 F (36.1 C), Max:98.6 F (37 C)  Recent Labs  Lab 08/01/20 2340 08/01/20 2341 08/02/20 0300 08/02/20 0630 08/02/20 1115 08/02/20 1452 08/04/20 0453  WBC  --  26.1*  --  18.8*  --   --  10.0  CREATININE 2.08*  --   --  1.60*  --   --  0.85  LATICACIDVEN  --  6.5* 4.5* 2.1* 2.0* 1.6  --     Estimated Creatinine Clearance: 81.4 mL/min (by C-G formula based on SCr of 0.85 mg/dL).    No Known Allergies  Alanda Slim, PharmD, [facility] Clinical Pharmacist Please see AMION for all Pharmacists' Contact Phone Numbers 08/05/2020, 8:24 AM

## 2020-08-05 NOTE — Telephone Encounter (Signed)
-----   Message from Candee Furbish, MD sent at 08/03/2020  2:19 PM EST ----- Regarding: 4-6 week follow up With [facility] or [place] Needs f/u for necrotizing pneumonia.  Thanks, Erskine Emery MD

## 2020-08-05 NOTE — Plan of Care (Signed)
  Problem: Clinical Measurements: Goal: Respiratory complications will improve Outcome: Progressing   Problem: Elimination: Goal: Will not experience complications related to bowel motility Outcome: Progressing Goal: Will not experience complications related to urinary retention Outcome: Progressing   Problem: Nutrition: Goal: Adequate nutrition will be maintained Outcome: Not Progressing Note: Pt is refusing meals

## 2020-08-05 NOTE — Plan of Care (Signed)
  Problem: Safety: Goal: Non-violent Restraint(s) 08/05/2020 2154 by Elmer Picker, RN Outcome: Not Progressing 08/05/2020 1950 by Elmer Picker, RN Outcome: Progressing  Pt continues with confusion and agitation. Pt restless in bed attempting to remove mittens from hands and pulling at foley catheter. Pt refused all po meds despite continued encouragement from nurse. Will continue to monitor pt.

## 2020-08-05 NOTE — Progress Notes (Signed)
Unable to do chest vest. Pt is restrained with a posey belt. Pt has clear breath sounds and is not coughing up anything.

## 2020-08-05 NOTE — Telephone Encounter (Signed)
Call made to patient, spoke with his wife, confirmed patient DOB. Appt made. Wife requests appt reminder be mailed. Printed and placed up front to be mailed.   Nothing further needed at this time.

## 2020-08-05 NOTE — Consult Note (Signed)
  60 year old gentleman with history of poor compliance, recurrent pneumonia and empyema who was recently diagnosed with another bout of pneumonia and treated with Bactrim.  Patient has been getting more agitated and not taking medications, not eating well and losing weight so wife brought patient to the hospital.  In the emergency room, he was on room air.  Chest x-ray showed multifocal pneumonia with cavitary lesions.  CT head normal.  Patient was combative.  Was given Haldol and Geodon.  CT scan shows extensive scarring and multifocal areas of pneumonia  Psych consult placed for persistent delirium.  Patient seen and attempted to assess, however patient remains encephalopathic at this time.  Patient was alert and oriented to self only. He is displaying incomprehensible speech and disorganized thought process. He is observed to be lying in bed and restrained. At the time of the evaluation patient was receiving Seroquel 25 mg p.o. twice daily, and Haldol 2 mg intravenously every 6 hours as needed for agitation.  Patient has received 3 doses of Haldol, since yesterday at 2200.  Patient is unable to participate in psychiatric evaluation at this time, will attempt to reassess tomorrow.  -Will adjust Seroquel 25 mg p.o. every morning and Seroquel 50 mg p.o. nightly for encephalopathy and delirium. EKG obtained on 03/11, QTc 463. -We will continue Haldol injection 2 mg q6hr for agitation and aggression.   - Recommend avoiding use of benzodiazepines at this time.   -Patient at risk for delirium, recommend initiating delirium precautions at this time.

## 2020-08-06 ENCOUNTER — Inpatient Hospital Stay (HOSPITAL_COMMUNITY): Payer: Medicaid Other

## 2020-08-06 DIAGNOSIS — J189 Pneumonia, unspecified organism: Secondary | ICD-10-CM

## 2020-08-06 LAB — BASIC METABOLIC PANEL
Anion gap: 7 (ref 5–15)
BUN: 11 mg/dL (ref 6–20)
CO2: 26 mmol/L (ref 22–32)
Calcium: 8.5 mg/dL — ABNORMAL LOW (ref 8.9–10.3)
Chloride: 109 mmol/L (ref 98–111)
Creatinine, Ser: 0.8 mg/dL (ref 0.61–1.24)
GFR, Estimated: >60 mL/min (ref >60)
Glucose, Bld: 85 mg/dL (ref 70–99)
Potassium: 3.5 mmol/L (ref 3.5–5.1)
Sodium: 142 mmol/L (ref 135–145)

## 2020-08-06 LAB — CBC WITH DIFFERENTIAL/PLATELET
Abs Immature Granulocytes: 0.12 10*3/uL — ABNORMAL HIGH (ref 0.00–0.07)
Basophils Absolute: 0 10*3/uL (ref 0.0–0.1)
Basophils Relative: 0 %
Eosinophils Absolute: 0 10*3/uL (ref 0.0–0.5)
Eosinophils Relative: 0 %
HCT: 31.8 % — ABNORMAL LOW (ref 39.0–52.0)
Hemoglobin: 10.1 g/dL — ABNORMAL LOW (ref 13.0–17.0)
Immature Granulocytes: 1 %
Lymphocytes Relative: 14 %
Lymphs Abs: 1.2 10*3/uL (ref 0.7–4.0)
MCH: 28.6 pg (ref 26.0–34.0)
MCHC: 31.8 g/dL (ref 30.0–36.0)
MCV: 90.1 fL (ref 80.0–100.0)
Monocytes Absolute: 0.9 10*3/uL (ref 0.1–1.0)
Monocytes Relative: 11 %
Neutro Abs: 6.4 10*3/uL (ref 1.7–7.7)
Neutrophils Relative %: 74 %
Platelets: 348 10*3/uL (ref 150–400)
RBC: 3.53 MIL/uL — ABNORMAL LOW (ref 4.22–5.81)
RDW: 13.8 % (ref 11.5–15.5)
WBC: 8.7 10*3/uL (ref 4.0–10.5)
nRBC: 0 % (ref 0.0–0.2)

## 2020-08-06 LAB — CULTURE, RESPIRATORY W GRAM STAIN: Culture: NORMAL

## 2020-08-06 LAB — PHOSPHORUS: Phosphorus: 2.6 mg/dL (ref 2.5–4.6)

## 2020-08-06 LAB — MAGNESIUM: Magnesium: 1.6 mg/dL — ABNORMAL LOW (ref 1.7–2.4)

## 2020-08-06 MED ORDER — QUETIAPINE FUMARATE 25 MG PO TABS
25.0000 mg | ORAL_TABLET | Freq: Two times a day (BID) | ORAL | Status: DC | PRN
  Administered 2020-08-09 – 2020-08-10 (×3): 25 mg via ORAL
  Filled 2020-08-06 (×4): qty 1

## 2020-08-06 MED ORDER — QUETIAPINE FUMARATE 25 MG PO TABS: 25.0000 mg | ORAL_TABLET | Freq: Two times a day (BID) | ORAL | Status: DC

## 2020-08-06 MED ORDER — QUETIAPINE FUMARATE 25 MG PO TABS
25.0000 mg | ORAL_TABLET | Freq: Once | ORAL | Status: AC
  Administered 2020-08-06: 25 mg via ORAL
  Filled 2020-08-06: qty 1

## 2020-08-06 MED ORDER — QUETIAPINE FUMARATE 50 MG PO TABS
50.0000 mg | ORAL_TABLET | Freq: Every morning | ORAL | Status: DC
  Administered 2020-08-07 – 2020-08-10 (×4): 50 mg via ORAL
  Filled 2020-08-06 (×4): qty 1

## 2020-08-06 MED ORDER — QUETIAPINE FUMARATE 100 MG PO TABS
100.0000 mg | ORAL_TABLET | Freq: Every day | ORAL | Status: DC
  Administered 2020-08-06 – 2020-08-09 (×4): 100 mg via ORAL
  Filled 2020-08-06 (×4): qty 1

## 2020-08-06 MED ORDER — MAGNESIUM SULFATE 2 GM/50ML IV SOLN
2.0000 g | Freq: Once | INTRAVENOUS | Status: AC
  Administered 2020-08-06: 2 g via INTRAVENOUS
  Filled 2020-08-06: qty 50

## 2020-08-06 NOTE — Progress Notes (Signed)
PROGRESS NOTE    Derrick Beltran  BDZ:[id number] DOB: 1961-03-26 DOA: 08/01/2020 PCP: Denyce Robert, FNP    Brief Narrative:  60 year old gentleman with history of poor compliance, recurrent pneumonia and empyema who was recently diagnosed with another bout of pneumonia and treated with Bactrim.  Patient has been getting more agitated and not taking medications, not eating well and losing weight so wife brought patient to the hospital.  In the emergency room, he was on room air.  Chest x-ray showed multifocal pneumonia with cavitary lesions.  CT head normal.  Patient was combative.  Was given Haldol and Geodon.  CT scan shows extensive scarring and multifocal pneumonia.   Assessment & Plan:   Principal Problem:   Multifocal pneumonia Active Problems:   Hyponatremia   Tobacco use   Necrotizing pneumonia ([place])   Unintentional weight loss   Sepsis ([place])   Acute metabolic encephalopathy   AKI (acute kidney injury) ([place])   Leukocytosis  Multifocal pneumonia: Acute on chronic pneumonia with structural changes on his lungs.  Previous history of Prevotella infection. Currently on room air. Patient was on vancomycin and Zosyn.  Unlikely MRSA.  Currently on Zosyn. Patient is altered and not participating on chest physiotherapy. Will start deep breathing exercises, incentive spirometry and flutter valve therapy as much he is able to do it. Followed by PCCM.  Recommended at least 1 month of anaerobic coverage.  Will change to oral antibiotic on discharge.  Will change to Augmentin on discharge when patient is more stable.  Acute metabolic encephalopathy/confusion and delirium:  Previous alcoholic.  No reported alcohol consumption for 1-1/2 months.  This is probably multifactorial.  CT head was normal. T41 and folic acid normal.  TSH level normal.  B1 level pending.  With previous history of heavy alcoholism, treating with high dose of thiamine.   All-time fall precautions.  Delirium  precautions. Patient is started on Seroquel.  Followed by psychiatry.  Continue to uptitrate dose to control behavior and agitation.  Today on 50 mg in the morning, 100 mg at night.  Also on as needed Seroquel as well as as needed Haldol. We will attempt MRI brain if possible, not urgent. Soft restraints and all times safety precautions to protect from fall, trauma and injuries.  Hypokalemia/hypomagnesemia: Replaced aggressively.  Adequate.  Replace magnesium further today.   DVT prophylaxis: enoxaparin (LOVENOX) injection 40 mg Start: 08/02/20 1000   Code Status: Full code Family Communication: Wife on the phone 3/14. Disposition Plan: Status is: Inpatient  Remains inpatient appropriate because:IV treatments appropriate due to intensity of illness or inability to take PO and Inpatient level of care appropriate due to severity of illness   Dispo: The patient is from: Home              Anticipated d/c is to: Home              Patient currently is not medically stable to d/c.   Difficult to place patient No         Consultants:   PCCM  Psychiatry  Procedures:   None  Antimicrobials:  Antibiotics Given (last 72 hours)    Date/Time Action Medication Dose Rate   08/03/20 1317 New Bag/Given   piperacillin-tazobactam (ZOSYN) IVPB 3.375 g 3.375 g 12.5 mL/hr   08/03/20 2211 New Bag/Given   piperacillin-tazobactam (ZOSYN) IVPB 3.375 g 3.375 g 12.5 mL/hr   08/04/20 0531 New Bag/Given   piperacillin-tazobactam (ZOSYN) IVPB 3.375 g 3.375 g 12.5 mL/hr   08/04/20  1535 New Bag/Given   piperacillin-tazobactam (ZOSYN) IVPB 3.375 g 3.375 g 12.5 mL/hr   08/04/20 2125 New Bag/Given   piperacillin-tazobactam (ZOSYN) IVPB 3.375 g 3.375 g 12.5 mL/hr   08/05/20 0548 New Bag/Given   piperacillin-tazobactam (ZOSYN) IVPB 3.375 g 3.375 g 12.5 mL/hr   08/05/20 1449 New Bag/Given   piperacillin-tazobactam (ZOSYN) IVPB 3.375 g 3.375 g 12.5 mL/hr   08/05/20 2026 New Bag/Given    piperacillin-tazobactam (ZOSYN) IVPB 3.375 g 3.375 g 12.5 mL/hr   08/06/20 0547 New Bag/Given   piperacillin-tazobactam (ZOSYN) IVPB 3.375 g 3.375 g 12.5 mL/hr         Subjective: Patient seen and examined.  Overnight events noted.  Had episodes of agitation and belligerent behavior.  Currently on two-point restraint.  States some unintelligible words.  Moves all extremities and thrashing around.  Afebrile.  On room air.  Objective: Vitals:   08/05/20 1928 08/06/20 0300 08/06/20 1112 08/06/20 1209  BP: 124/78 135/79 120/75 128/79  Pulse: (!) 101 (!) 104    Resp: 20 20  (!) 23  Temp: 98 F (36.7 C) 97.6 F (36.4 C) 98.7 F (37.1 C) 98.7 F (37.1 C)  TempSrc: Oral Axillary Axillary Axillary  SpO2: 100% 100%  100%  Weight:      Height:        Intake/Output Summary (Last 24 hours) at 08/06/2020 1307 Last data filed at 08/06/2020 1200 Gross per 24 hour  Intake 59.08 ml  Output 1575 ml  Net -1515.92 ml   Filed Weights   08/01/20 2323  Weight: 61.5 kg    Examination:  General exam: Appears frail and debilitated.  Alert on stimulation.  Delirious and impulsive at times. Respiratory system: Mostly conducted airway sounds. Cardiovascular system: S1 & S2 heard, RRR.  Gastrointestinal system: Abdomen is nondistended, soft and nontender. No organomegaly or masses felt. Normal bowel sounds heard. Extremities: Symmetric 5 x 5 power.  Moving all extremities.    Data Reviewed: I have personally reviewed following labs and imaging studies  CBC: Recent Labs  Lab 08/01/20 2341 08/02/20 0630 08/04/20 0453 08/06/20 0205  WBC 26.1* 18.8* 10.0 8.7  NEUTROABS 23.2*  --  7.5 6.4  HGB 14.5 11.1* 11.3* 10.1*  HCT 43.1 34.2* 34.9* 31.8*  MCV 88.0 88.4 89.3 90.1  PLT 590* 359 362 470   Basic Metabolic Panel: Recent Labs  Lab 08/01/20 2340 08/02/20 0630 08/04/20 0453 08/06/20 0205  NA 130* 132* 139 142  K 5.0 4.1 3.4* 3.5  CL 90* 96* 103 109  CO2 23 23 27 26   GLUCOSE 173*  124* 90 85  BUN 84* 72* 17 11  CREATININE 2.08* 1.60* 0.85 0.80  CALCIUM 9.6 8.3* 8.9 8.5*  MG  --   --  1.6* 1.6*  PHOS  --   --  3.0 2.6   GFR: Estimated Creatinine Clearance: 86.5 mL/min (by C-G formula based on SCr of 0.8 mg/dL). Liver Function Tests: Recent Labs  Lab 08/01/20 2340  AST 26  ALT 15  ALKPHOS 62  BILITOT 0.7  PROT 8.0  ALBUMIN 2.0*   No results for input(s): LIPASE, AMYLASE in the last 168 hours. No results for input(s): AMMONIA in the last 168 hours. Coagulation Profile: Recent Labs  Lab 08/01/20 2348 08/03/20 0451  INR 1.3* 1.3*   Cardiac Enzymes: No results for input(s): CKTOTAL, CKMB, CKMBINDEX, TROPONINI in the last 168 hours. BNP (last 3 results) No results for input(s): PROBNP in the last 8760 hours. HbA1C: No results for input(s):  HGBA1C in the last 72 hours. CBG: Recent Labs  Lab 08/05/20 0838 08/05/20 1206 08/05/20 1635 08/05/20 2109  GLUCAP 90 122* 95 73   Lipid Profile: No results for input(s): CHOL, HDL, LDLCALC, TRIG, CHOLHDL, LDLDIRECT in the last 72 hours. Thyroid Function Tests: No results for input(s): TSH, T4TOTAL, FREET4, T3FREE, THYROIDAB in the last 72 hours. Anemia Panel: No results for input(s): VITAMINB12, FOLATE, FERRITIN, TIBC, IRON, RETICCTPCT in the last 72 hours. Sepsis Labs: Recent Labs  Lab 08/02/20 0300 08/02/20 0630 08/02/20 1115 08/02/20 1452  PROCALCITON  --  2.45  --   --   LATICACIDVEN 4.5* 2.1* 2.0* 1.6    Recent Results (from the past 240 hour(s))  Resp Panel by RT-PCR (Flu A&B, Covid) Nasopharyngeal Swab     Status: None   Collection Time: 08/01/20  3:50 AM   Specimen: Nasopharyngeal Swab; Nasopharyngeal(NP) swabs in vial transport medium  Result Value Ref Range Status   SARS Coronavirus 2 by RT PCR NEGATIVE NEGATIVE Final    Comment: (NOTE) SARS-CoV-2 target nucleic acids are NOT DETECTED.  The SARS-CoV-2 RNA is generally detectable in upper respiratory specimens during the acute phase  of infection. The lowest concentration of SARS-CoV-2 viral copies this assay can detect is 138 copies/mL. A negative result does not preclude SARS-Cov-2 infection and should not be used as the sole basis for treatment or other patient management decisions. A negative result may occur with  improper specimen collection/handling, submission of specimen other than nasopharyngeal swab, presence of viral mutation(s) within the areas targeted by this assay, and inadequate number of viral copies(<138 copies/mL). A negative result must be combined with clinical observations, patient history, and epidemiological information. The expected result is Negative.  Fact Sheet for Patients:  EntrepreneurPulse.com.au  Fact Sheet for Healthcare Providers:  IncredibleEmployment.be  This test is no t yet approved or cleared by the [place] FDA and  has been authorized for detection and/or diagnosis of SARS-CoV-2 by FDA under an Emergency Use Authorization (EUA). This EUA will remain  in effect (meaning this test can be used) for the duration of the COVID-19 declaration under Section 564(b)(1) of the Act, 21 U.S.C.section 360bbb-3(b)(1), unless the authorization is terminated  or revoked sooner.       Influenza A by PCR NEGATIVE NEGATIVE Final   Influenza B by PCR NEGATIVE NEGATIVE Final    Comment: (NOTE) The Xpert Xpress SARS-CoV-2/FLU/RSV plus assay is intended as an aid in the diagnosis of influenza from Nasopharyngeal swab specimens and should not be used as a sole basis for treatment. Nasal washings and aspirates are unacceptable for Xpert Xpress SARS-CoV-2/FLU/RSV testing.  Fact Sheet for Patients: EntrepreneurPulse.com.au  Fact Sheet for Healthcare Providers: IncredibleEmployment.be  This test is not yet approved or cleared by the [place] FDA and has been authorized for detection and/or diagnosis of  SARS-CoV-2 by FDA under an Emergency Use Authorization (EUA). This EUA will remain in effect (meaning this test can be used) for the duration of the COVID-19 declaration under Section 564(b)(1) of the Act, 21 U.S.C. section 360bbb-3(b)(1), unless the authorization is terminated or revoked.  Performed at [facility], [place] [address] [place], [place] [address]   Culture, blood (Routine x 2)     Status: None (Preliminary result)   Collection Time: 08/01/20 11:25 PM   Specimen: BLOOD LEFT ARM  Result Value Ref Range Status   Specimen Description BLOOD LEFT ARM  Final   Special Requests   Final    BOTTLES  DRAWN AEROBIC AND ANAEROBIC Blood Culture adequate volume   Culture   Final    NO GROWTH 4 DAYS Performed at [facility], [place] [address] [place], [place] [address]    Report Status PENDING  Incomplete  Culture, blood (Routine x 2)     Status: None (Preliminary result)   Collection Time: 08/01/20 11:42 PM   Specimen: BLOOD RIGHT ARM  Result Value Ref Range Status   Specimen Description BLOOD RIGHT ARM  Final   Special Requests AEROBIC BOTTLE ONLY Blood Culture adequate volume  Final   Culture   Final    NO GROWTH 4 DAYS Performed at [facility], [place] [address] [place], [place] [address]    Report Status PENDING  Incomplete  Urine culture     Status: None   Collection Time: 08/02/20  4:23 AM   Specimen: In/Out Cath Urine  Result Value Ref Range Status   Specimen Description IN/OUT CATH URINE  Final   Special Requests NONE  Final   Culture   Final    NO GROWTH Performed at [facility], [place] [address] [place], [place] [address]    Report Status 08/03/2020 FINAL  Final  MRSA PCR Screening     Status: None   Collection Time: 08/02/20 11:07 AM   Specimen: Nasopharyngeal  Result Value Ref Range Status   MRSA by PCR NEGATIVE NEGATIVE Final    Comment:        The GeneXpert MRSA Assay (FDA approved for NASAL specimens only), is one component of  a comprehensive MRSA colonization surveillance program. It is not intended to diagnose MRSA infection nor to guide or monitor treatment for MRSA infections. Performed at [facility], [place] [address], [place], [place] [address]   Culture, Respiratory w Gram Stain     Status: None   Collection Time: 08/04/20 12:56 PM   Specimen: Tracheal Aspirate  Result Value Ref Range Status   Specimen Description TRACHEAL ASPIRATE  Final   Special Requests NONE  Final   Gram Stain   Final    FEW WBC PRESENT, PREDOMINANTLY PMN RARE GRAM POSITIVE COCCI IN PAIRS    Culture   Final    RARE Normal respiratory flora-no Staph aureus or Pseudomonas seen Performed at [facility], [place] [address], [place], [place] [address]    Report Status 08/06/2020 FINAL  Final         Radiology Studies: No results found.      Scheduled Meds:  Chlorhexidine Gluconate Cloth  6 each Topical Daily   enoxaparin (LOVENOX) injection  40 mg Subcutaneous Daily   feeding supplement  237 mL Oral TID BM   folic acid  1 mg Intravenous Daily   LORazepam  1 mg Intravenous Once   magnesium oxide  400 mg Oral BID   multivitamin with minerals  1 tablet Oral Daily   [START ON 08/07/2020] QUEtiapine  50 mg Oral q morning   And   QUEtiapine  100 mg Oral QHS   QUEtiapine  25 mg Oral Once   traZODone  100 mg Oral QHS   Continuous Infusions:  piperacillin-tazobactam (ZOSYN)  IV 3.375 g (08/06/20 0547)   sodium chloride 500 mL (08/02/20 1333)     LOS: 4 days    Time spent: 30 minutes    Barb Merino, MD [facility] Pager [phone]

## 2020-08-06 NOTE — Progress Notes (Signed)
Was able to get most of pt's meds in him tonight. He was still refusing and swatting at the spoon (pt's left arm restraint wasn't tied back to the bed upon his return from MRI), but this RN was able to get spoon in pt's mouth and pt chewed the applesauce with medication. A small amount was left in the medicine cup. This RN attempted to give pt that last small amount and pt successfully swatted the spoon and applesauce landed on pt's pillow. L. Arm restraint checked at that time and reinforced. Will continue to monitor pt.
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
## 2020-08-06 NOTE — Progress Notes (Signed)
Pt left unit for MRI. Pt premedicated per EMAR. Sitter to go down with pt.

## 2020-08-06 NOTE — Plan of Care (Signed)
  Problem: Safety: Goal: Non-violent Restraint(s) Outcome: Not Progressing   Problem: Education: Goal: Knowledge of General Education information will improve Description: Including pain rating scale, medication(s)/side effects and non-pharmacologic comfort measures Outcome: Not Progressing   Problem: Health Behavior/Discharge Planning: Goal: Ability to manage health-related needs will improve Outcome: Not Progressing   Problem: Clinical Measurements: Goal: Ability to maintain clinical measurements within normal limits will improve Outcome: Not Progressing Goal: Will remain free from infection Outcome: Not Progressing Goal: Diagnostic test results will improve Outcome: Not Progressing Goal: Respiratory complications will improve Outcome: Not Progressing Goal: Cardiovascular complication will be avoided Outcome: Not Progressing   Problem: Activity: Goal: Risk for activity intolerance will decrease Outcome: Not Progressing   Problem: Nutrition: Goal: Adequate nutrition will be maintained Outcome: Not Progressing   Problem: Coping: Goal: Level of anxiety will decrease Outcome: Not Progressing   Problem: Elimination: Goal: Will not experience complications related to bowel motility Outcome: Not Progressing Goal: Will not experience complications related to urinary retention Outcome: Not Progressing   Problem: Pain Managment: Goal: General experience of comfort will improve Outcome: Not Progressing   Problem: Safety: Goal: Ability to remain free from injury will improve Outcome: Not Progressing   Problem: Skin Integrity: Goal: Risk for impaired skin integrity will decrease Outcome: Not Progressing   

## 2020-08-07 LAB — CULTURE, BLOOD (ROUTINE X 2)
Culture: NO GROWTH
Culture: NO GROWTH
Special Requests: ADEQUATE
Special Requests: ADEQUATE

## 2020-08-07 MED ORDER — THIAMINE HCL 100 MG PO TABS
100.0000 mg | ORAL_TABLET | Freq: Every day | ORAL | Status: DC
  Administered 2020-08-07 – 2020-08-12 (×6): 100 mg via ORAL
  Filled 2020-08-07 (×6): qty 1
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
## 2020-08-07 NOTE — Progress Notes (Signed)
PROGRESS NOTE    Derrick Beltran  DZH:[id number] DOB: August 24, 1960 DOA: 08/01/2020 PCP: Denyce Robert, FNP    Brief Narrative:  60 year old gentleman with history of poor compliance, recurrent pneumonia and empyema who was recently diagnosed with another bout of pneumonia and treated with Bactrim.  Patient has been getting more agitated and not taking medications, not eating well and losing weight so wife brought patient to the hospital.  In the emergency room, he was on room air.  Chest x-ray showed multifocal pneumonia with cavitary lesions.  CT head normal.  Patient was combative.  Was given Haldol and Geodon.  CT scan shows extensive scarring and multifocal pneumonia. Remains in the hospital with altered mental status, difficulty to control behavior related to agitation and delirium.   Assessment & Plan:   Principal Problem:   Multifocal pneumonia Active Problems:   Hyponatremia   Tobacco use   Necrotizing pneumonia ([place])   Unintentional weight loss   Sepsis ([place])   Acute metabolic encephalopathy   AKI (acute kidney injury) ([place])   Leukocytosis  Multifocal pneumonia: Acute on chronic pneumonia with structural changes on his lungs.  Previous history of Prevotella infection. Currently on room air. Patient was on vancomycin and Zosyn.  Unlikely MRSA.  Currently on Zosyn. Patient is altered and not participating on chest physiotherapy. Will start deep breathing exercises, incentive spirometry and flutter valve therapy as much he is able to do it. Followed by PCCM.  Recommended at least 1 month of anaerobic coverage.  Will change to oral antibiotic on discharge.  Will change to Augmentin on discharge when patient is more stable.  Acute metabolic encephalopathy/confusion and delirium:  Previous alcoholic.  No reported alcohol consumption for 1-1/2 months.  This is probably multifactorial.  CT head was normal.  MRI brain was essentially normal.  Though poor quality. M19 and folic acid  normal.  TSH level normal.  B1 level pending.  With previous history of heavy alcoholism, treating with high dose of thiamine.  Continue on maintenance thiamine with 100 mg daily. All-time fall precautions.  Delirium precautions. Patient is started on Seroquel.  Followed by psychiatry.  Continue to uptitrate dose to control behavior and agitation. Also on as needed Seroquel as well as as needed Haldol. Soft restraints and all times safety precautions to protect from fall, trauma and injuries. Continue Air cabin crew as needed.  Hypokalemia/hypomagnesemia: Replaced aggressively.  Adequate.  Keep on a schedule replacement.  Will check frequently.   DVT prophylaxis: enoxaparin (LOVENOX) injection 40 mg Start: 08/02/20 1000   Code Status: Full code Family Communication: Wife on the phone  Disposition Plan: Status is: Inpatient  Remains inpatient appropriate because:IV treatments appropriate due to intensity of illness or inability to take PO and Inpatient level of care appropriate due to severity of illness   Dispo: The patient is from: Home              Anticipated d/c is to: Unknown.              Patient currently is not medically stable to d/c.   Difficult to place patient No  Patient remains with poor mental status, agitations and occasional aggressions, not stable to discharge.       Consultants:   PCCM  Psychiatry  Procedures:   None  Antimicrobials:  Antibiotics Given (last 72 hours)    Date/Time Action Medication Dose Rate   08/04/20 1535 New Bag/Given   piperacillin-tazobactam (ZOSYN) IVPB 3.375 g 3.375 g 12.5 mL/hr  08/04/20 2125 New Bag/Given   piperacillin-tazobactam (ZOSYN) IVPB 3.375 g 3.375 g 12.5 mL/hr   08/05/20 0548 New Bag/Given   piperacillin-tazobactam (ZOSYN) IVPB 3.375 g 3.375 g 12.5 mL/hr   08/05/20 1449 New Bag/Given   piperacillin-tazobactam (ZOSYN) IVPB 3.375 g 3.375 g 12.5 mL/hr   08/05/20 2026 New Bag/Given   piperacillin-tazobactam (ZOSYN)  IVPB 3.375 g 3.375 g 12.5 mL/hr   08/06/20 0547 New Bag/Given   piperacillin-tazobactam (ZOSYN) IVPB 3.375 g 3.375 g 12.5 mL/hr   08/06/20 1438 New Bag/Given   piperacillin-tazobactam (ZOSYN) IVPB 3.375 g 3.375 g 12.5 mL/hr   08/06/20 2131 New Bag/Given   piperacillin-tazobactam (ZOSYN) IVPB 3.375 g 3.375 g 12.5 mL/hr   08/07/20 0517 New Bag/Given   piperacillin-tazobactam (ZOSYN) IVPB 3.375 g 3.375 g 12.5 mL/hr         Subjective: Patient was seen and examined.  Overnight events noted.  He was spitting his medications and food.  Nurses were able to give him morning Seroquel. Patient is answering questions today, his speech is more clear, however he thinks he is in [place] and still has flight of ideas.  He laughs in between.   Objective: Vitals:   08/06/20 1609 08/06/20 2059 08/07/20 0530 08/07/20 0732  BP: 137/89 138/82 128/74 124/74  Pulse:      Resp: (!) 23 18 20 18   Temp: 98 F (36.7 C) (!) 97.5 F (36.4 C) 97.9 F (36.6 C) 98 F (36.7 C)  TempSrc: Axillary Axillary Axillary Oral  SpO2: 100% 99% 100% 100%  Weight:      Height:        Intake/Output Summary (Last 24 hours) at 08/07/2020 1114 Last data filed at 08/07/2020 0531 Gross per 24 hour  Intake 254.54 ml  Output 575 ml  Net -320.46 ml   Filed Weights   08/01/20 2323  Weight: 61.5 kg    Examination:  General exam: Appears frail and debilitated.  Not in any distress. Pressured speech.  Alert and awake but oriented to self only.  Not oriented to time and place. Respiratory system: Mostly conducted airway sounds. Cardiovascular system: S1 & S2 heard, RRR.  Gastrointestinal system: Abdomen is nondistended, soft and nontender. No organomegaly or masses felt. Normal bowel sounds heard. Extremities: Symmetric 5 x 5 power.  Moving all extremities.    Data Reviewed: I have personally reviewed following labs and imaging studies  CBC: Recent Labs  Lab 08/01/20 2341 08/02/20 0630 08/04/20 0453  08/06/20 0205  WBC 26.1* 18.8* 10.0 8.7  NEUTROABS 23.2*  --  7.5 6.4  HGB 14.5 11.1* 11.3* 10.1*  HCT 43.1 34.2* 34.9* 31.8*  MCV 88.0 88.4 89.3 90.1  PLT 590* 359 362 630   Basic Metabolic Panel: Recent Labs  Lab 08/01/20 2340 08/02/20 0630 08/04/20 0453 08/06/20 0205  NA 130* 132* 139 142  K 5.0 4.1 3.4* 3.5  CL 90* 96* 103 109  CO2 23 23 27 26   GLUCOSE 173* 124* 90 85  BUN 84* 72* 17 11  CREATININE 2.08* 1.60* 0.85 0.80  CALCIUM 9.6 8.3* 8.9 8.5*  MG  --   --  1.6* 1.6*  PHOS  --   --  3.0 2.6   GFR: Estimated Creatinine Clearance: 86.5 mL/min (by C-G formula based on SCr of 0.8 mg/dL). Liver Function Tests: Recent Labs  Lab 08/01/20 2340  AST 26  ALT 15  ALKPHOS 62  BILITOT 0.7  PROT 8.0  ALBUMIN 2.0*   No results for input(s): LIPASE, AMYLASE in  the last 168 hours. No results for input(s): AMMONIA in the last 168 hours. Coagulation Profile: Recent Labs  Lab 08/01/20 2348 08/03/20 0451  INR 1.3* 1.3*   Cardiac Enzymes: No results for input(s): CKTOTAL, CKMB, CKMBINDEX, TROPONINI in the last 168 hours. BNP (last 3 results) No results for input(s): PROBNP in the last 8760 hours. HbA1C: No results for input(s): HGBA1C in the last 72 hours. CBG: Recent Labs  Lab 08/05/20 0838 08/05/20 1206 08/05/20 1635 08/05/20 2109  GLUCAP 90 122* 95 73   Lipid Profile: No results for input(s): CHOL, HDL, LDLCALC, TRIG, CHOLHDL, LDLDIRECT in the last 72 hours. Thyroid Function Tests: No results for input(s): TSH, T4TOTAL, FREET4, T3FREE, THYROIDAB in the last 72 hours. Anemia Panel: No results for input(s): VITAMINB12, FOLATE, FERRITIN, TIBC, IRON, RETICCTPCT in the last 72 hours. Sepsis Labs: Recent Labs  Lab 08/02/20 0300 08/02/20 0630 08/02/20 1115 08/02/20 1452  PROCALCITON  --  2.45  --   --   LATICACIDVEN 4.5* 2.1* 2.0* 1.6    Recent Results (from the past 240 hour(s))  Resp Panel by RT-PCR (Flu A&B, Covid) Nasopharyngeal Swab     Status: None    Collection Time: 08/01/20  3:50 AM   Specimen: Nasopharyngeal Swab; Nasopharyngeal(NP) swabs in vial transport medium  Result Value Ref Range Status   SARS Coronavirus 2 by RT PCR NEGATIVE NEGATIVE Final    Comment: (NOTE) SARS-CoV-2 target nucleic acids are NOT DETECTED.  The SARS-CoV-2 RNA is generally detectable in upper respiratory specimens during the acute phase of infection. The lowest concentration of SARS-CoV-2 viral copies this assay can detect is 138 copies/mL. A negative result does not preclude SARS-Cov-2 infection and should not be used as the sole basis for treatment or other patient management decisions. A negative result may occur with  improper specimen collection/handling, submission of specimen other than nasopharyngeal swab, presence of viral mutation(s) within the areas targeted by this assay, and inadequate number of viral copies(<138 copies/mL). A negative result must be combined with clinical observations, patient history, and epidemiological information. The expected result is Negative.  Fact Sheet for Patients:  EntrepreneurPulse.com.au  Fact Sheet for Healthcare Providers:  IncredibleEmployment.be  This test is no t yet approved or cleared by the [place] FDA and  has been authorized for detection and/or diagnosis of SARS-CoV-2 by FDA under an Emergency Use Authorization (EUA). This EUA will remain  in effect (meaning this test can be used) for the duration of the COVID-19 declaration under Section 564(b)(1) of the Act, 21 U.S.C.section 360bbb-3(b)(1), unless the authorization is terminated  or revoked sooner.       Influenza A by PCR NEGATIVE NEGATIVE Final   Influenza B by PCR NEGATIVE NEGATIVE Final    Comment: (NOTE) The Xpert Xpress SARS-CoV-2/FLU/RSV plus assay is intended as an aid in the diagnosis of influenza from Nasopharyngeal swab specimens and should not be used as a sole basis for treatment.  Nasal washings and aspirates are unacceptable for Xpert Xpress SARS-CoV-2/FLU/RSV testing.  Fact Sheet for Patients: EntrepreneurPulse.com.au  Fact Sheet for Healthcare Providers: IncredibleEmployment.be  This test is not yet approved or cleared by the [place] FDA and has been authorized for detection and/or diagnosis of SARS-CoV-2 by FDA under an Emergency Use Authorization (EUA). This EUA will remain in effect (meaning this test can be used) for the duration of the COVID-19 declaration under Section 564(b)(1) of the Act, 21 U.S.C. section 360bbb-3(b)(1), unless the authorization is terminated or revoked.  Performed at [facility]  [facility], [place] [address] [place], [place] [address]   Culture, blood (Routine x 2)     Status: None   Collection Time: 08/01/20 11:25 PM   Specimen: BLOOD LEFT ARM  Result Value Ref Range Status   Specimen Description BLOOD LEFT ARM  Final   Special Requests   Final    BOTTLES DRAWN AEROBIC AND ANAEROBIC Blood Culture adequate volume   Culture   Final    NO GROWTH 5 DAYS Performed at [facility], [place] [address], [place], [place] [address]    Report Status 08/07/2020 FINAL  Final  Culture, blood (Routine x 2)     Status: None   Collection Time: 08/01/20 11:42 PM   Specimen: BLOOD RIGHT ARM  Result Value Ref Range Status   Specimen Description BLOOD RIGHT ARM  Final   Special Requests AEROBIC BOTTLE ONLY Blood Culture adequate volume  Final   Culture   Final    NO GROWTH 5 DAYS Performed at [facility], [place] [address] [place], [place] [address]    Report Status 08/07/2020 FINAL  Final  Urine culture     Status: None   Collection Time: 08/02/20  4:23 AM   Specimen: In/Out Cath Urine  Result Value Ref Range Status   Specimen Description IN/OUT CATH URINE  Final   Special Requests NONE  Final   Culture   Final    NO GROWTH Performed at [facility], [place] [address] [place],  [place] [address]    Report Status 08/03/2020 FINAL  Final  MRSA PCR Screening     Status: None   Collection Time: 08/02/20 11:07 AM   Specimen: Nasopharyngeal  Result Value Ref Range Status   MRSA by PCR NEGATIVE NEGATIVE Final    Comment:        The GeneXpert MRSA Assay (FDA approved for NASAL specimens only), is one component of a comprehensive MRSA colonization surveillance program. It is not intended to diagnose MRSA infection nor to guide or monitor treatment for MRSA infections. Performed at [facility], [place] [address] [place], [place] [address]   Culture, Respiratory w Gram Stain     Status: None   Collection Time: 08/04/20 12:56 PM   Specimen: Tracheal Aspirate  Result Value Ref Range Status   Specimen Description TRACHEAL ASPIRATE  Final   Special Requests NONE  Final   Gram Stain   Final    FEW WBC PRESENT, PREDOMINANTLY PMN RARE GRAM POSITIVE COCCI IN PAIRS    Culture   Final    RARE Normal respiratory flora-no Staph aureus or Pseudomonas seen Performed at [facility], [place] [address] [place], [place] [address]    Report Status 08/06/2020 FINAL  Final         Radiology Studies: MR BRAIN WO CONTRAST  Result Date: 08/07/2020 CLINICAL DATA:  Initial evaluation for acute mental status change. EXAM: MRI HEAD WITHOUT CONTRAST TECHNIQUE: Multiplanar, multiecho pulse sequences of the brain and surrounding structures were obtained without intravenous contrast. COMPARISON:  Prior CT from 08/02/2020. FINDINGS: Brain: Examination severely limited due to extensive motion artifact. Additionally, patient was unable to tolerate the full length of the exam, with only diffusion weighted sequence, axial T2, sagittal T1 weighted sequences performed. Generalized age-related cerebral atrophy. No definite foci of restricted diffusion to suggest acute or subacute ischemia. No visible areas of encephalomalacia to suggest chronic cortical infarction. No visible mass lesion, mass  effect, or midline  shift. Ventricles normal in size without hydrocephalus. No definite extra-axial fluid collection. Vascular: Major intracranial vascular flow voids are grossly maintained at the skull base. Skull and upper cervical spine: Craniocervical junction grossly within normal limits. Bone marrow signal intensity grossly normal. No visible scalp soft tissue abnormality. Sinuses/Orbits: Globes orbital soft tissues grossly within normal limits. Paranasal sinuses and mastoid air cells are grossly clear. Other: None. IMPRESSION: 1. Technically limited exam due to severe motion artifact and patient's inability to tolerate the full length of the study. 2. No definite acute intracranial abnormality identified. Electronically Signed   By: Jeannine Boga M.D.   On: 08/07/2020 00:07        Scheduled Meds: . Chlorhexidine Gluconate Cloth  6 each Topical Daily  . enoxaparin (LOVENOX) injection  40 mg Subcutaneous Daily  . feeding supplement  237 mL Oral TID BM  . folic acid  1 mg Intravenous Daily  . magnesium oxide  400 mg Oral BID  . multivitamin with minerals  1 tablet Oral Daily  . QUEtiapine  50 mg Oral q morning   And  . QUEtiapine  100 mg Oral QHS  . thiamine  100 mg Oral Daily  . traZODone  100 mg Oral QHS   Continuous Infusions: . piperacillin-tazobactam (ZOSYN)  IV 3.375 g (08/07/20 0517)  . sodium chloride 500 mL (08/02/20 1333)     LOS: 5 days    Time spent: 30 minutes    Barb Merino, MD [facility] Pager [phone]

## 2020-08-08 NOTE — Progress Notes (Signed)
PROGRESS NOTE    Derrick Beltran  MOQ:[id number] DOB: 1961/03/29 DOA: 08/01/2020 PCP: Denyce Robert, FNP    Brief Narrative:  60 year old gentleman with history of poor compliance, recurrent pneumonia and empyema who was recently diagnosed with another bout of pneumonia and treated with Bactrim.  Patient has been getting more agitated and not taking medications, not eating well and losing weight so wife brought patient to the hospital.  In the emergency room, he was on room air.  Chest x-ray showed multifocal pneumonia with cavitary lesions.  CT head normal.  Patient was combative.  Was given Haldol and Geodon.  CT scan shows extensive scarring and multifocal pneumonia. Remains in the hospital with altered mental status, difficulty to control behavior related to agitation and delirium.   Assessment & Plan:   Principal Problem:   Multifocal pneumonia Active Problems:   Hyponatremia   Tobacco use   Necrotizing pneumonia ([place])   Unintentional weight loss   Sepsis ([place])   Acute metabolic encephalopathy   AKI (acute kidney injury) ([place])   Leukocytosis  Multifocal pneumonia:  Acute on chronic pneumonia with structural changes on his lungs.  Previous history of Prevotella infection. On room air.  No positive culture data yet. Patient was on vancomycin and Zosyn.  Unlikely MRSA.  Currently on Zosyn. Difficult participating with chest physiotherapy.  Doing as much possible. Followed by PCCM.  Recommended discharge on 1 month of antibiotics, will change to oral Augmentin on discharge.  Acute metabolic encephalopathy/confusion and delirium:  Previous alcoholic.  Reportedly no drinks for the last 1 month.  Probably multifactorial delirium.   CT head and MRI brain essentially normal.   Vitamin levels including P54, folic acid, TSH, B1 levels normal.  Treated with high-dose thiamine for 5 days.  Currently remains on maintenance thiamine.   Difficult to control symptoms.  Also seen by  psychiatry.  Currently remains on Seroquel higher doses, will allow similar dose today and uptitrate tomorrow if needed.  As needed Haldol available.   Safety precautions.  Delirium precautions.  Fall precautions.  Continue restraints if needed to protect IV lines and protect from trauma.    Electrolyte abnormalities: Aggressively replaced.  We will recheck tomorrow morning.   DVT prophylaxis: enoxaparin (LOVENOX) injection 40 mg Start: 08/02/20 1000   Code Status: Full code Family Communication: Wife on the phone 3/16. Disposition Plan: Status is: Inpatient  Remains inpatient appropriate because:IV treatments appropriate due to intensity of illness or inability to take PO and Inpatient level of care appropriate due to severity of illness   Dispo: The patient is from: Home              Anticipated d/c is to: Unknown.              Patient currently is not medically stable to d/c.   Difficult to place patient No  Patient remains with poor mental status, agitations and occasional aggressions, not stable to discharge.       Consultants:   PCCM  Psychiatry  Procedures:   None  Antimicrobials:  Antibiotics Given (last 72 hours)    Date/Time Action Medication Dose Rate   08/05/20 1449 New Bag/Given   piperacillin-tazobactam (ZOSYN) IVPB 3.375 g 3.375 g 12.5 mL/hr   08/05/20 2026 New Bag/Given   piperacillin-tazobactam (ZOSYN) IVPB 3.375 g 3.375 g 12.5 mL/hr   08/06/20 0547 New Bag/Given   piperacillin-tazobactam (ZOSYN) IVPB 3.375 g 3.375 g 12.5 mL/hr   08/06/20 1438 New Bag/Given   piperacillin-tazobactam (ZOSYN) IVPB  3.375 g 3.375 g 12.5 mL/hr   08/06/20 2131 New Bag/Given   piperacillin-tazobactam (ZOSYN) IVPB 3.375 g 3.375 g 12.5 mL/hr   08/07/20 0517 New Bag/Given   piperacillin-tazobactam (ZOSYN) IVPB 3.375 g 3.375 g 12.5 mL/hr   08/07/20 1211 New Bag/Given   piperacillin-tazobactam (ZOSYN) IVPB 3.375 g 3.375 g 12.5 mL/hr   08/07/20 2057 New Bag/Given    piperacillin-tazobactam (ZOSYN) IVPB 3.375 g 3.375 g 12.5 mL/hr   08/08/20 0539 New Bag/Given   piperacillin-tazobactam (ZOSYN) IVPB 3.375 g 3.375 g 12.5 mL/hr         Subjective: Patient seen and examined.  Overnight needed some additional Haldol.  His voice is clear but he still confused.  "Let me get out of here, I need to go home."  Remains on two-point soft restraints.   Objective: Vitals:   08/07/20 1129 08/07/20 1903 08/07/20 2300 08/08/20 0300  BP: 129/83 112/70 124/84 133/86  Pulse:  99 93 94  Resp: (!) 23 (!) 25 (!) 24 (!) 23  Temp: (!) 97.5 F (36.4 C) 97.8 F (36.6 C) 97.7 F (36.5 C) 97.8 F (36.6 C)  TempSrc: Oral Axillary Axillary Axillary  SpO2: 100% 100% 100% 100%  Weight:      Height:        Intake/Output Summary (Last 24 hours) at 08/08/2020 1126 Last data filed at 08/08/2020 0500 Gross per 24 hour  Intake 605.5 ml  Output 520 ml  Net 85.5 ml   Filed Weights   08/01/20 2323  Weight: 61.5 kg    Examination:  General exam: Appears frail and debilitated.  Not in any distress. Alert.  Oriented to self.  Not oriented to place and time.  Restless.  Denies any complaints. Respiratory system: No added sounds.  Bilateral clear. Cardiovascular system: S1 & S2 heard, RRR.  Gastrointestinal system: Abdomen is nondistended, soft and nontender. No organomegaly or masses felt. Normal bowel sounds heard. Extremities: Symmetric 5 x 5 power.  Moving all extremities.    Data Reviewed: I have personally reviewed following labs and imaging studies  CBC: Recent Labs  Lab 08/01/20 2341 08/02/20 0630 08/04/20 0453 08/06/20 0205  WBC 26.1* 18.8* 10.0 8.7  NEUTROABS 23.2*  --  7.5 6.4  HGB 14.5 11.1* 11.3* 10.1*  HCT 43.1 34.2* 34.9* 31.8*  MCV 88.0 88.4 89.3 90.1  PLT 590* 359 362 366   Basic Metabolic Panel: Recent Labs  Lab 08/01/20 2340 08/02/20 0630 08/04/20 0453 08/06/20 0205  NA 130* 132* 139 142  K 5.0 4.1 3.4* 3.5  CL 90* 96* 103 109  CO2  23 23 27 26   GLUCOSE 173* 124* 90 85  BUN 84* 72* 17 11  CREATININE 2.08* 1.60* 0.85 0.80  CALCIUM 9.6 8.3* 8.9 8.5*  MG  --   --  1.6* 1.6*  PHOS  --   --  3.0 2.6   GFR: Estimated Creatinine Clearance: 86.5 mL/min (by C-G formula based on SCr of 0.8 mg/dL). Liver Function Tests: Recent Labs  Lab 08/01/20 2340  AST 26  ALT 15  ALKPHOS 62  BILITOT 0.7  PROT 8.0  ALBUMIN 2.0*   No results for input(s): LIPASE, AMYLASE in the last 168 hours. No results for input(s): AMMONIA in the last 168 hours. Coagulation Profile: Recent Labs  Lab 08/01/20 2348 08/03/20 0451  INR 1.3* 1.3*   Cardiac Enzymes: No results for input(s): CKTOTAL, CKMB, CKMBINDEX, TROPONINI in the last 168 hours. BNP (last 3 results) No results for input(s): PROBNP in the  last 8760 hours. HbA1C: No results for input(s): HGBA1C in the last 72 hours. CBG: Recent Labs  Lab 08/05/20 0838 08/05/20 1206 08/05/20 1635 08/05/20 2109  GLUCAP 90 122* 95 73   Lipid Profile: No results for input(s): CHOL, HDL, LDLCALC, TRIG, CHOLHDL, LDLDIRECT in the last 72 hours. Thyroid Function Tests: No results for input(s): TSH, T4TOTAL, FREET4, T3FREE, THYROIDAB in the last 72 hours. Anemia Panel: No results for input(s): VITAMINB12, FOLATE, FERRITIN, TIBC, IRON, RETICCTPCT in the last 72 hours. Sepsis Labs: Recent Labs  Lab 08/02/20 0300 08/02/20 0630 08/02/20 1115 08/02/20 1452  PROCALCITON  --  2.45  --   --   LATICACIDVEN 4.5* 2.1* 2.0* 1.6    Recent Results (from the past 240 hour(s))  Resp Panel by RT-PCR (Flu A&B, Covid) Nasopharyngeal Swab     Status: None   Collection Time: 08/01/20  3:50 AM   Specimen: Nasopharyngeal Swab; Nasopharyngeal(NP) swabs in vial transport medium  Result Value Ref Range Status   SARS Coronavirus 2 by RT PCR NEGATIVE NEGATIVE Final    Comment: (NOTE) SARS-CoV-2 target nucleic acids are NOT DETECTED.  The SARS-CoV-2 RNA is generally detectable in upper  respiratory specimens during the acute phase of infection. The lowest concentration of SARS-CoV-2 viral copies this assay can detect is 138 copies/mL. A negative result does not preclude SARS-Cov-2 infection and should not be used as the sole basis for treatment or other patient management decisions. A negative result may occur with  improper specimen collection/handling, submission of specimen other than nasopharyngeal swab, presence of viral mutation(s) within the areas targeted by this assay, and inadequate number of viral copies(<138 copies/mL). A negative result must be combined with clinical observations, patient history, and epidemiological information. The expected result is Negative.  Fact Sheet for Patients:  EntrepreneurPulse.com.au  Fact Sheet for Healthcare Providers:  IncredibleEmployment.be  This test is no t yet approved or cleared by the [place] FDA and  has been authorized for detection and/or diagnosis of SARS-CoV-2 by FDA under an Emergency Use Authorization (EUA). This EUA will remain  in effect (meaning this test can be used) for the duration of the COVID-19 declaration under Section 564(b)(1) of the Act, 21 U.S.C.section 360bbb-3(b)(1), unless the authorization is terminated  or revoked sooner.       Influenza A by PCR NEGATIVE NEGATIVE Final   Influenza B by PCR NEGATIVE NEGATIVE Final    Comment: (NOTE) The Xpert Xpress SARS-CoV-2/FLU/RSV plus assay is intended as an aid in the diagnosis of influenza from Nasopharyngeal swab specimens and should not be used as a sole basis for treatment. Nasal washings and aspirates are unacceptable for Xpert Xpress SARS-CoV-2/FLU/RSV testing.  Fact Sheet for Patients: EntrepreneurPulse.com.au  Fact Sheet for Healthcare Providers: IncredibleEmployment.be  This test is not yet approved or cleared by the [place] FDA and has been  authorized for detection and/or diagnosis of SARS-CoV-2 by FDA under an Emergency Use Authorization (EUA). This EUA will remain in effect (meaning this test can be used) for the duration of the COVID-19 declaration under Section 564(b)(1) of the Act, 21 U.S.C. section 360bbb-3(b)(1), unless the authorization is terminated or revoked.  Performed at [facility], [place] [address] [place], [place] [address]   Culture, blood (Routine x 2)     Status: None   Collection Time: 08/01/20 11:25 PM   Specimen: BLOOD LEFT ARM  Result Value Ref Range Status   Specimen Description BLOOD LEFT ARM  Final   Special Requests  Final    BOTTLES DRAWN AEROBIC AND ANAEROBIC Blood Culture adequate volume   Culture   Final    NO GROWTH 5 DAYS Performed at [facility], [place] [address], [place], [place] [address]    Report Status 08/07/2020 FINAL  Final  Culture, blood (Routine x 2)     Status: None   Collection Time: 08/01/20 11:42 PM   Specimen: BLOOD RIGHT ARM  Result Value Ref Range Status   Specimen Description BLOOD RIGHT ARM  Final   Special Requests AEROBIC BOTTLE ONLY Blood Culture adequate volume  Final   Culture   Final    NO GROWTH 5 DAYS Performed at [facility], [place] [address], [place], [place] [address]    Report Status 08/07/2020 FINAL  Final  Urine culture     Status: None   Collection Time: 08/02/20  4:23 AM   Specimen: In/Out Cath Urine  Result Value Ref Range Status   Specimen Description IN/OUT CATH URINE  Final   Special Requests NONE  Final   Culture   Final    NO GROWTH Performed at [facility], [place] [address], [place], [place] [address]    Report Status 08/03/2020 FINAL  Final  MRSA PCR Screening     Status: None   Collection Time: 08/02/20 11:07 AM   Specimen: Nasopharyngeal  Result Value Ref Range Status   MRSA by PCR NEGATIVE NEGATIVE Final    Comment:        The GeneXpert MRSA Assay (FDA approved for NASAL specimens only), is one  component of a comprehensive MRSA colonization surveillance program. It is not intended to diagnose MRSA infection nor to guide or monitor treatment for MRSA infections. Performed at [facility], [place] [address], [place], [place] [address]   Culture, Respiratory w Gram Stain     Status: None   Collection Time: 08/04/20 12:56 PM   Specimen: Tracheal Aspirate  Result Value Ref Range Status   Specimen Description TRACHEAL ASPIRATE  Final   Special Requests NONE  Final   Gram Stain   Final    FEW WBC PRESENT, PREDOMINANTLY PMN RARE GRAM POSITIVE COCCI IN PAIRS    Culture   Final    RARE Normal respiratory flora-no Staph aureus or Pseudomonas seen Performed at [facility], [place] [address], [place], [place] [address]    Report Status 08/06/2020 FINAL  Final         Radiology Studies: MR BRAIN WO CONTRAST  Result Date: 08/07/2020 CLINICAL DATA:  Initial evaluation for acute mental status change. EXAM: MRI HEAD WITHOUT CONTRAST TECHNIQUE: Multiplanar, multiecho pulse sequences of the brain and surrounding structures were obtained without intravenous contrast. COMPARISON:  Prior CT from 08/02/2020. FINDINGS: Brain: Examination severely limited due to extensive motion artifact. Additionally, patient was unable to tolerate the full length of the exam, with only diffusion weighted sequence, axial T2, sagittal T1 weighted sequences performed. Generalized age-related cerebral atrophy. No definite foci of restricted diffusion to suggest acute or subacute ischemia. No visible areas of encephalomalacia to suggest chronic cortical infarction. No visible mass lesion, mass effect, or midline shift. Ventricles normal in size without hydrocephalus. No definite extra-axial fluid collection. Vascular: Major intracranial vascular flow voids are grossly maintained at the skull base. Skull and upper cervical spine: Craniocervical junction grossly within normal limits. Bone marrow signal intensity  grossly normal. No visible scalp soft tissue abnormality. Sinuses/Orbits: Globes orbital soft tissues grossly within normal limits.  Paranasal sinuses and mastoid air cells are grossly clear. Other: None. IMPRESSION: 1. Technically limited exam due to severe motion artifact and patient's inability to tolerate the full length of the study. 2. No definite acute intracranial abnormality identified. Electronically Signed   By: Jeannine Boga M.D.   On: 08/07/2020 00:07        Scheduled Meds: . Chlorhexidine Gluconate Cloth  6 each Topical Daily  . enoxaparin (LOVENOX) injection  40 mg Subcutaneous Daily  . feeding supplement  237 mL Oral TID BM  . folic acid  1 mg Intravenous Daily  . magnesium oxide  400 mg Oral BID  . multivitamin with minerals  1 tablet Oral Daily  . QUEtiapine  50 mg Oral q morning   And  . QUEtiapine  100 mg Oral QHS  . thiamine  100 mg Oral Daily  . traZODone  100 mg Oral QHS   Continuous Infusions: . piperacillin-tazobactam (ZOSYN)  IV 3.375 g (08/08/20 0539)  . sodium chloride 500 mL (08/02/20 1333)     LOS: 6 days    Time spent: 30 minutes    Barb Merino, MD [facility] Pager [phone]

## 2020-08-08 NOTE — Plan of Care (Signed)
  Problem: Safety: Goal: Non-violent Restraint(s) Outcome: Progressing   Problem: Education: Goal: Knowledge of General Education information will improve Description: Including pain rating scale, medication(s)/side effects and non-pharmacologic comfort measures Outcome: Progressing   Problem: Health Behavior/Discharge Planning: Goal: Ability to manage health-related needs will improve Outcome: Progressing   Problem: Clinical Measurements: Goal: Will remain free from infection Outcome: Progressing Goal: Diagnostic test results will improve Outcome: Progressing   Problem: Activity: Goal: Risk for activity intolerance will decrease Outcome: Progressing

## 2020-08-09 LAB — CBC WITH DIFFERENTIAL/PLATELET
Abs Immature Granulocytes: 0.06 10*3/uL (ref 0.00–0.07)
Basophils Absolute: 0.1 10*3/uL (ref 0.0–0.1)
Basophils Relative: 1 %
Eosinophils Absolute: 0.1 10*3/uL (ref 0.0–0.5)
Eosinophils Relative: 1 %
HCT: 31.2 % — ABNORMAL LOW (ref 39.0–52.0)
Hemoglobin: 9.9 g/dL — ABNORMAL LOW (ref 13.0–17.0)
Immature Granulocytes: 1 %
Lymphocytes Relative: 12 %
Lymphs Abs: 1 10*3/uL (ref 0.7–4.0)
MCH: 28.5 pg (ref 26.0–34.0)
MCHC: 31.7 g/dL (ref 30.0–36.0)
MCV: 89.9 fL (ref 80.0–100.0)
Monocytes Absolute: 0.6 10*3/uL (ref 0.1–1.0)
Monocytes Relative: 7 %
Neutro Abs: 6.5 10*3/uL (ref 1.7–7.7)
Neutrophils Relative %: 78 %
Platelets: 292 10*3/uL (ref 150–400)
RBC: 3.47 MIL/uL — ABNORMAL LOW (ref 4.22–5.81)
RDW: 13.5 % (ref 11.5–15.5)
WBC: 8.3 10*3/uL (ref 4.0–10.5)
nRBC: 0 % (ref 0.0–0.2)

## 2020-08-09 LAB — COMPREHENSIVE METABOLIC PANEL
ALT: 7 U/L (ref 0–44)
AST: 15 U/L (ref 15–41)
Albumin: 1.4 g/dL — ABNORMAL LOW (ref 3.5–5.0)
Alkaline Phosphatase: 43 U/L (ref 38–126)
Anion gap: 8 (ref 5–15)
BUN: 8 mg/dL (ref 6–20)
CO2: 25 mmol/L (ref 22–32)
Calcium: 8 mg/dL — ABNORMAL LOW (ref 8.9–10.3)
Chloride: 101 mmol/L (ref 98–111)
Creatinine, Ser: 0.87 mg/dL (ref 0.61–1.24)
GFR, Estimated: >60 mL/min (ref >60)
Glucose, Bld: 76 mg/dL (ref 70–99)
Potassium: 3.5 mmol/L (ref 3.5–5.1)
Sodium: 134 mmol/L — ABNORMAL LOW (ref 135–145)
Total Bilirubin: 1.1 mg/dL (ref 0.3–1.2)
Total Protein: 6.2 g/dL — ABNORMAL LOW (ref 6.5–8.1)

## 2020-08-09 LAB — PHOSPHORUS: Phosphorus: 2.8 mg/dL (ref 2.5–4.6)

## 2020-08-09 LAB — MAGNESIUM: Magnesium: 1.7 mg/dL (ref 1.7–2.4)

## 2020-08-09 MED ORDER — MAGNESIUM SULFATE 2 GM/50ML IV SOLN
2.0000 g | Freq: Once | INTRAVENOUS | Status: AC
  Administered 2020-08-09: 2 g via INTRAVENOUS
  Filled 2020-08-09: qty 50

## 2020-08-09 MED ORDER — AMOXICILLIN-POT CLAVULANATE 875-125 MG PO TABS
1.0000 | ORAL_TABLET | Freq: Two times a day (BID) | ORAL | Status: DC
  Administered 2020-08-09 – 2020-08-12 (×7): 1 via ORAL
  Filled 2020-08-09 (×7): qty 1

## 2020-08-09 MED ORDER — HALOPERIDOL LACTATE 5 MG/ML IJ SOLN
5.0000 mg | Freq: Four times a day (QID) | INTRAMUSCULAR | Status: DC | PRN
  Administered 2020-08-09 – 2020-08-10 (×2): 5 mg via INTRAMUSCULAR
  Filled 2020-08-09 (×2): qty 1

## 2020-08-09 MED ORDER — FOLIC ACID 1 MG PO TABS
1.0000 mg | ORAL_TABLET | Freq: Every day | ORAL | Status: DC
  Administered 2020-08-09 – 2020-08-12 (×4): 1 mg via ORAL
  Filled 2020-08-09 (×4): qty 1

## 2020-08-09 NOTE — Progress Notes (Signed)
Physical Therapy Evaluation Patient Details Name: Derrick Beltran MRN: [id number] DOB: 10/12/1960 Today's Date: 08/09/2020   History of Present Illness  60 yo male with onset of recetn PNA was noted upon readmission to have necrotizing PNA.  Has leukocytosis, sepsis, hyponatremia, empyema and has noted refusal to eat and take meds with agitation. No changes on CT or MRI of brain.  PMHx:  AKI, PNA, tobacco use  Clinical Impression  Pt was seen for mobility on the hallway with HHA, using PT assist for control of his balance and to cue safety awareness.  He is impulsive, and after restraints were removed, tried to walk away on the hall without a mask until PT could convince him to wait for her to obtain one.  He is finally assisted back to bed with restraints discreetly reapplied, and then to let nursing know how he was able to do.  Did not get a good reading on pulse ox after walk as pt had very cold fingers and would not read.  Follow for acute PT goals below.      Follow Up Recommendations SNF    Equipment Recommendations  None recommended by PT    Recommendations for Other Services       Precautions / Restrictions Precautions Precautions: Fall Precaution Comments: monitor O2 sats Restrictions Weight Bearing Restrictions: No Other Position/Activity Restrictions: has UE restraints in place      Mobility  Bed Mobility Overal bed mobility: Needs Assistance Bed Mobility: Supine to Sit;Sit to Supine     Supine to sit: Min guard Sit to supine: Min guard   General bed mobility comments: min guard for safety as pt is unsteady in sitting and standing    Transfers Overall transfer level: Needs assistance Equipment used: None Transfers: Sit to/from Stand Sit to Stand: Min guard         General transfer comment: min guard for safety  Ambulation/Gait Ambulation/Gait assistance: Min assist;Min guard Gait Distance (Feet): 125 Feet Assistive device: 1 person hand held assist Gait  Pattern/deviations: Step-to pattern;Step-through pattern;Decreased stride length;Wide base of support;Staggering right;Staggering left Gait velocity: reduced Gait velocity interpretation: <1.31 ft/sec, indicative of household ambulator General Gait Details: pt is laterally unsteady in walking as well as listing backward at times.  Stairs            Wheelchair Mobility    Modified Rankin (Stroke Patients Only)       Balance Overall balance assessment: Needs assistance   Sitting balance-Leahy Scale: Fair     Standing balance support: Single extremity supported Standing balance-Leahy Scale: Poor                               Pertinent Vitals/Pain Pain Assessment: No/denies pain    Home Living Family/patient expects to be discharged to:: Private residence Living Arrangements: Spouse/significant other Available Help at Discharge: Family             Additional Comments: pt is unable to give history of living situation    Prior Function Level of Independence:  (unsure)         Comments: pt cannot give history     Hand Dominance   Dominant Hand: Right    Extremity/Trunk Assessment   Upper Extremity Assessment Upper Extremity Assessment: Overall WFL for tasks assessed    Lower Extremity Assessment Lower Extremity Assessment: Generalized weakness    Cervical / Trunk Assessment Cervical / Trunk Assessment: Kyphotic  Communication  Communication: No difficulties  Cognition Arousal/Alertness: Awake/alert Behavior During Therapy: Impulsive;Agitated Overall Cognitive Status: No family/caregiver present to determine baseline cognitive functioning                                 General Comments: unsure of where he is compared to baseline      General Comments General comments (skin integrity, edema, etc.): pt is listing even in static standing, but is unaware and will not automatically reach to steady himself    Exercises      Assessment/Plan    PT Assessment Patient needs continued PT services  PT Problem List Decreased strength;Decreased balance;Decreased coordination;Decreased mobility;Decreased cognition;Decreased knowledge of use of DME;Decreased knowledge of precautions;Decreased safety awareness;Cardiopulmonary status limiting activity       PT Treatment Interventions DME instruction;Gait training;Stair training;Functional mobility training;Therapeutic activities;Therapeutic exercise;Balance training;Patient/family education;Neuromuscular re-education    PT Goals (Current goals can be found in the Care Plan section)  Acute Rehab PT Goals Patient Stated Goal: none stated PT Goal Formulation: Patient unable to participate in goal setting Time For Goal Achievement: 08/29/20 Potential to Achieve Goals: Good    Frequency Min 3X/week   Barriers to discharge Decreased caregiver support wife may not have assist to help pt    Co-evaluation               AM-PAC PT "6 Clicks" Mobility  Outcome Measure Help needed turning from your back to your side while in a flat bed without using bedrails?: A Little Help needed moving from lying on your back to sitting on the side of a flat bed without using bedrails?: A Little Help needed moving to and from a bed to a chair (including a wheelchair)?: A Little Help needed standing up from a chair using your arms (e.g., wheelchair or bedside chair)?: A Little Help needed to walk in hospital room?: A Little Help needed climbing 3-5 steps with a railing? : A Lot 6 Click Score: 17    End of Session   Activity Tolerance: Patient limited by fatigue;Treatment limited secondary to medical complications (Comment) Patient left: in bed;with restraints reapplied;with call bell/phone within reach;with bed alarm set Nurse Communication: Mobility status PT Visit Diagnosis: Unsteadiness on feet (R26.81);Muscle weakness (generalized) (M62.81);Difficulty in walking, not  elsewhere classified (R26.2);Ataxic gait (R26.0);Adult, failure to thrive (R62.7)    Time: 4315-4008 PT Time Calculation (min) (ACUTE ONLY): 25 min   Charges:   PT Evaluation $PT Eval Moderate Complexity: 1 Mod PT Treatments $Gait Training: 8-22 mins       Ramond Dial 08/09/2020, 3:40 PM Mee Hives, PT MS Acute Rehab Dept. Number: [place] [address]

## 2020-08-09 NOTE — Progress Notes (Signed)
Nutrition Follow-up  DOCUMENTATION CODES:   Not applicable  INTERVENTION:   Continue Ensure Enlive po TID, each supplement provides 350 kcal and 20 grams of protein  Continue Magic cup TID with meals, each supplement provides 290 kcal and 9 grams of protein  Continue MVI with minerals daily    NUTRITION DIAGNOSIS:   Inadequate oral intake related to poor appetite as evidenced by per patient/family report.  ongoing  GOAL:   Patient will meet greater than or equal to 90% of their needs  progressing  MONITOR:   PO intake,Supplement acceptance,Labs,Weight trends,I & O's  REASON FOR ASSESSMENT:   Consult Assessment of nutrition requirement/status  ASSESSMENT:   Pt admitted with severe sepsis 2/2 multifocal cavitary pneumonia. 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  Had been following intermittently with Dr. Roxan Hockey with [facility]. PMH also includes FTT and EtOH abuse.  Pt noted to be more coherent today.   Limited meal documentation available. 0-25% meal completion x 5 recorded meals (6% average meal intake. Pt with orders for Ensure TID and typically does well with these per RN.   UOP: 767ml x24 hours  Medications: folvite, mag-ox, mvi with minerals, thiamine Labs: Na 134 (L)  Diet Order:   Diet Order            Diet general           Diet regular Room service appropriate? No; Fluid consistency: Thin  Diet effective now                 EDUCATION NEEDS:   No education needs have been identified at this time  Skin:  Skin Assessment: Reviewed RN Assessment  Last BM:  3/18 type 6&7  Height:   Ht Readings from Last 1 Encounters:  08/01/20 5\' 9"  (1.753 m)    Weight:   Wt Readings from Last 1 Encounters:  08/01/20 61.5 kg    BMI:  Body mass index is 20.02 kg/m.  Estimated Nutritional Needs:   Kcal:  1850-2050  Protein:   90-100 grams  Fluid:  >1.9L    Larkin Ina, MS, RD, LDN RD pager number and weekend/on-call pager number located in [place].

## 2020-08-09 NOTE — Progress Notes (Signed)
PROGRESS NOTE    Derrick Beltran  RWE:[id number] DOB: [birth year] DOA: 08/01/2020 PCP: Denyce Robert, FNP    Brief Narrative:  60 year old gentleman with history of poor compliance, recurrent pneumonia and empyema who was recently diagnosed with another bout of pneumonia and treated with Bactrim.  Patient has been getting more agitated and not taking medications, not eating well and losing weight so wife brought patient to the hospital.  In the emergency room, he was on room air.  Chest x-ray showed multifocal pneumonia with cavitary lesions.  CT head normal.  Patient was combative.  Was given Haldol and Geodon.  CT scan shows extensive scarring and multifocal pneumonia. Remains in the hospital with altered mental status, difficulty to control behavior related to agitation and delirium. 2/18, he is more coherent and behaviorally better controlled today.   Assessment & Plan:   Principal Problem:   Multifocal pneumonia Active Problems:   Hyponatremia   Tobacco use   Necrotizing pneumonia ([place])   Unintentional weight loss   Sepsis ([place])   Acute metabolic encephalopathy   AKI (acute kidney injury) ([place])   Leukocytosis  Multifocal pneumonia: Incompletely treated complicated pneumonia. Acute on chronic pneumonia with structural changes on his lungs.  Previous history of Prevotella infection. On room air.  No positive culture data yet. Patient was on vancomycin and Zosyn.  Unlikely MRSA.  Currently on Zosyn. Stabilizing.  Changed to oral antibiotics with Augmentin for total 1 month as recommended by pulmonary. We will continue respiratory therapy as much as she can do.  Acute metabolic encephalopathy/confusion and delirium:  Previous alcoholic.  Reportedly no drinks for the last 1 month.  Probably multifactorial delirium.   CT head and MRI brain essentially normal.  Vitamin levels including Y19, folic acid, TSH, B1 levels normal.  Treated with high-dose thiamine for 5 days.  Currently  remains on maintenance thiamine.  Patient was very delirious and agitated. He is treated with high-dose Seroquel and now his symptoms are much improving.    Electrolyte abnormalities: Aggressively replaced.  Replace magnesium further.  She is delirium is much improving today.  He is more coherent. Discontinue Foley catheter.  Discontinue telemetry.  Change to oral antibiotics.  Discontinue maintenance IV lines and fluids.  Continue close monitoring for fall precautions and delirium. Mobilize in the hallway.  Minimize interventions and keep him happy.  DVT prophylaxis: enoxaparin (LOVENOX) injection 40 mg Start: 08/02/20 1000   Code Status: Full code Family Communication: Patient's wife called, unable to talk.  Will call later. Disposition Plan: Status is: Inpatient  Remains inpatient appropriate because:IV treatments appropriate due to intensity of illness or inability to take PO and Inpatient level of care appropriate due to severity of illness   Dispo: The patient is from: Home              Anticipated d/c is to: Unknown.              Patient currently is not medically stable to d/c.   Difficult to place patient No  Consultants:   PCCM  Psychiatry  Procedures:   None  Antimicrobials:  Antibiotics Given (last 72 hours)    Date/Time Action Medication Dose Rate   08/06/20 1438 New Bag/Given   piperacillin-tazobactam (ZOSYN) IVPB 3.375 g 3.375 g 12.5 mL/hr   08/06/20 2131 New Bag/Given   piperacillin-tazobactam (ZOSYN) IVPB 3.375 g 3.375 g 12.5 mL/hr   08/07/20 0517 New Bag/Given   piperacillin-tazobactam (ZOSYN) IVPB 3.375 g 3.375 g 12.5 mL/hr   08/07/20  1211 New Bag/Given   piperacillin-tazobactam (ZOSYN) IVPB 3.375 g 3.375 g 12.5 mL/hr   08/07/20 2057 New Bag/Given   piperacillin-tazobactam (ZOSYN) IVPB 3.375 g 3.375 g 12.5 mL/hr   08/08/20 0539 New Bag/Given   piperacillin-tazobactam (ZOSYN) IVPB 3.375 g 3.375 g 12.5 mL/hr   08/08/20 1223 New Bag/Given    piperacillin-tazobactam (ZOSYN) IVPB 3.375 g 3.375 g 12.5 mL/hr   08/08/20 2104 New Bag/Given   piperacillin-tazobactam (ZOSYN) IVPB 3.375 g 3.375 g 12.5 mL/hr   08/09/20 0608 New Bag/Given   piperacillin-tazobactam (ZOSYN) IVPB 3.375 g 3.375 g 12.5 mL/hr         Subjective: Patient seen and examined.  Surprisingly, his voice is clear, his speech is coherent today.  He tells me that he lives in [place] with his wife, he does not need to be in the hospital.  He agrees that he would like to get better and continue medicine at home.  I recommended that we discontinue his monitors but he needs to stay in the hospital until things are stable and he agrees.   Objective: Vitals:   08/07/20 2300 08/08/20 0300 08/08/20 1500 08/08/20 2218  BP: 124/84 133/86 123/79 111/77  Pulse: 93 94 75 96  Resp: (!) 24 (!) 23 16 (!) 21  Temp: 97.7 F (36.5 C) 97.8 F (36.6 C) 97.7 F (36.5 C) 97.7 F (36.5 C)  TempSrc: Axillary Axillary Oral Oral  SpO2: 100% 100% 100% 100%  Weight:      Height:        Intake/Output Summary (Last 24 hours) at 08/09/2020 1044 Last data filed at 08/09/2020 0855 Gross per 24 hour  Intake 240 ml  Output 875 ml  Net -635 ml   Filed Weights   08/01/20 2323  Weight: 61.5 kg    Examination:  General exam: Appears frail and debilitated.  Not in any distress. Patient is alert and oriented x2 today.  No obvious restlessness or agitation noted. Respiratory system: No added sounds.  Bilateral clear. Cardiovascular system: S1 & S2 heard, RRR.  Gastrointestinal system: Abdomen is nondistended, soft and nontender. No organomegaly or masses felt. Normal bowel sounds heard. Extremities: Symmetric 5 x 5 power.  Moving all extremities. Foley catheter with clear urine.    Data Reviewed: I have personally reviewed following labs and imaging studies  CBC: Recent Labs  Lab 08/04/20 0453 08/06/20 0205 08/09/20 0257  WBC 10.0 8.7 8.3  NEUTROABS 7.5 6.4 6.5  HGB 11.3*  10.1* 9.9*  HCT 34.9* 31.8* 31.2*  MCV 89.3 90.1 89.9  PLT 362 348 196   Basic Metabolic Panel: Recent Labs  Lab 08/04/20 0453 08/06/20 0205 08/09/20 0257  NA 139 142 134*  K 3.4* 3.5 3.5  CL 103 109 101  CO2 27 26 25   GLUCOSE 90 85 76  BUN 17 11 8   CREATININE 0.85 0.80 0.87  CALCIUM 8.9 8.5* 8.0*  MG 1.6* 1.6* 1.7  PHOS 3.0 2.6 2.8   GFR: Estimated Creatinine Clearance: 79.5 mL/min (by C-G formula based on SCr of 0.87 mg/dL). Liver Function Tests: Recent Labs  Lab 08/09/20 0257  AST 15  ALT 7  ALKPHOS 43  BILITOT 1.1  PROT 6.2*  ALBUMIN 1.4*   No results for input(s): LIPASE, AMYLASE in the last 168 hours. No results for input(s): AMMONIA in the last 168 hours. Coagulation Profile: Recent Labs  Lab 08/03/20 0451  INR 1.3*   Cardiac Enzymes: No results for input(s): CKTOTAL, CKMB, CKMBINDEX, TROPONINI in the last 168  hours. BNP (last 3 results) No results for input(s): PROBNP in the last 8760 hours. HbA1C: No results for input(s): HGBA1C in the last 72 hours. CBG: Recent Labs  Lab 08/05/20 0838 08/05/20 1206 08/05/20 1635 08/05/20 2109  GLUCAP 90 122* 95 73   Lipid Profile: No results for input(s): CHOL, HDL, LDLCALC, TRIG, CHOLHDL, LDLDIRECT in the last 72 hours. Thyroid Function Tests: No results for input(s): TSH, T4TOTAL, FREET4, T3FREE, THYROIDAB in the last 72 hours. Anemia Panel: No results for input(s): VITAMINB12, FOLATE, FERRITIN, TIBC, IRON, RETICCTPCT in the last 72 hours. Sepsis Labs: Recent Labs  Lab 08/02/20 1115 08/02/20 1452  LATICACIDVEN 2.0* 1.6    Recent Results (from the past 240 hour(s))  Resp Panel by RT-PCR (Flu A&B, Covid) Nasopharyngeal Swab     Status: None   Collection Time: 08/01/20  3:50 AM   Specimen: Nasopharyngeal Swab; Nasopharyngeal(NP) swabs in vial transport medium  Result Value Ref Range Status   SARS Coronavirus 2 by RT PCR NEGATIVE NEGATIVE Final    Comment: (NOTE) SARS-CoV-2 target nucleic acids  are NOT DETECTED.  The SARS-CoV-2 RNA is generally detectable in upper respiratory specimens during the acute phase of infection. The lowest concentration of SARS-CoV-2 viral copies this assay can detect is 138 copies/mL. A negative result does not preclude SARS-Cov-2 infection and should not be used as the sole basis for treatment or other patient management decisions. A negative result may occur with  improper specimen collection/handling, submission of specimen other than nasopharyngeal swab, presence of viral mutation(s) within the areas targeted by this assay, and inadequate number of viral copies(<138 copies/mL). A negative result must be combined with clinical observations, patient history, and epidemiological information. The expected result is Negative.  Fact Sheet for Patients:  EntrepreneurPulse.com.au  Fact Sheet for Healthcare Providers:  IncredibleEmployment.be  This test is no t yet approved or cleared by the [place] FDA and  has been authorized for detection and/or diagnosis of SARS-CoV-2 by FDA under an Emergency Use Authorization (EUA). This EUA will remain  in effect (meaning this test can be used) for the duration of the COVID-19 declaration under Section 564(b)(1) of the Act, 21 U.S.C.section 360bbb-3(b)(1), unless the authorization is terminated  or revoked sooner.       Influenza A by PCR NEGATIVE NEGATIVE Final   Influenza B by PCR NEGATIVE NEGATIVE Final    Comment: (NOTE) The Xpert Xpress SARS-CoV-2/FLU/RSV plus assay is intended as an aid in the diagnosis of influenza from Nasopharyngeal swab specimens and should not be used as a sole basis for treatment. Nasal washings and aspirates are unacceptable for Xpert Xpress SARS-CoV-2/FLU/RSV testing.  Fact Sheet for Patients: EntrepreneurPulse.com.au  Fact Sheet for Healthcare Providers: IncredibleEmployment.be  This test is  not yet approved or cleared by the [place] FDA and has been authorized for detection and/or diagnosis of SARS-CoV-2 by FDA under an Emergency Use Authorization (EUA). This EUA will remain in effect (meaning this test can be used) for the duration of the COVID-19 declaration under Section 564(b)(1) of the Act, 21 U.S.C. section 360bbb-3(b)(1), unless the authorization is terminated or revoked.  Performed at [facility], [place] [address], [place], [place] [address]   Culture, blood (Routine x 2)     Status: None   Collection Time: 08/01/20 11:25 PM   Specimen: BLOOD LEFT ARM  Result Value Ref Range Status   Specimen Description BLOOD LEFT ARM  Final   Special Requests   Final    BOTTLES  DRAWN AEROBIC AND ANAEROBIC Blood Culture adequate volume   Culture   Final    NO GROWTH 5 DAYS Performed at [facility], [place] [address], [place], [place] [address]    Report Status 08/07/2020 FINAL  Final  Culture, blood (Routine x 2)     Status: None   Collection Time: 08/01/20 11:42 PM   Specimen: BLOOD RIGHT ARM  Result Value Ref Range Status   Specimen Description BLOOD RIGHT ARM  Final   Special Requests AEROBIC BOTTLE ONLY Blood Culture adequate volume  Final   Culture   Final    NO GROWTH 5 DAYS Performed at [facility], [place] [address] [place], [place] [address]    Report Status 08/07/2020 FINAL  Final  Urine culture     Status: None   Collection Time: 08/02/20  4:23 AM   Specimen: In/Out Cath Urine  Result Value Ref Range Status   Specimen Description IN/OUT CATH URINE  Final   Special Requests NONE  Final   Culture   Final    NO GROWTH Performed at [facility], [place] [address] [place], [place] [address]    Report Status 08/03/2020 FINAL  Final  MRSA PCR Screening     Status: None   Collection Time: 08/02/20 11:07 AM   Specimen: Nasopharyngeal  Result Value Ref Range Status   MRSA by PCR NEGATIVE NEGATIVE Final    Comment:        The GeneXpert  MRSA Assay (FDA approved for NASAL specimens only), is one component of a comprehensive MRSA colonization surveillance program. It is not intended to diagnose MRSA infection nor to guide or monitor treatment for MRSA infections. Performed at [facility], [place] [address] [place], [place] [address]   Culture, Respiratory w Gram Stain     Status: None   Collection Time: 08/04/20 12:56 PM   Specimen: Tracheal Aspirate  Result Value Ref Range Status   Specimen Description TRACHEAL ASPIRATE  Final   Special Requests NONE  Final   Gram Stain   Final    FEW WBC PRESENT, PREDOMINANTLY PMN RARE GRAM POSITIVE COCCI IN PAIRS    Culture   Final    RARE Normal respiratory flora-no Staph aureus or Pseudomonas seen Performed at [facility], [place] [address], [place], [place] [address]    Report Status 08/06/2020 FINAL  Final         Radiology Studies: No results found.      Scheduled Meds: . amoxicillin-clavulanate  1 tablet Oral Q12H  . Chlorhexidine Gluconate Cloth  6 each Topical Daily  . enoxaparin (LOVENOX) injection  40 mg Subcutaneous Daily  . feeding supplement  237 mL Oral TID BM  . folic acid  1 mg Oral Daily  . magnesium oxide  400 mg Oral BID  . multivitamin with minerals  1 tablet Oral Daily  . QUEtiapine  50 mg Oral q morning   And  . QUEtiapine  100 mg Oral QHS  . thiamine  100 mg Oral Daily  . traZODone  100 mg Oral QHS   Continuous Infusions: . magnesium sulfate bolus IVPB    . sodium chloride 500 mL (08/02/20 1333)     LOS: 7 days    Time spent: 30 minutes    Barb Merino, MD [facility] Pager [phone]

## 2020-08-10 MED ORDER — QUETIAPINE FUMARATE 100 MG PO TABS
200.0000 mg | ORAL_TABLET | Freq: Every day | ORAL | Status: DC
  Administered 2020-08-10 – 2020-08-11 (×2): 200 mg via ORAL
  Filled 2020-08-10 (×2): qty 2

## 2020-08-10 MED ORDER — QUETIAPINE FUMARATE 100 MG PO TABS
100.0000 mg | ORAL_TABLET | Freq: Every morning | ORAL | Status: DC
  Administered 2020-08-11 – 2020-08-12 (×2): 100 mg via ORAL
  Filled 2020-08-10 (×2): qty 1

## 2020-08-10 NOTE — Progress Notes (Signed)
PROGRESS NOTE    Derrick Beltran  HGD:[id number] DOB: 1961-03-08 DOA: 08/01/2020 PCP: Denyce Robert, FNP    Brief Narrative:  60 year old gentleman with history of poor compliance, recurrent pneumonia and empyema who was recently diagnosed with another bout of pneumonia and treated with Bactrim.  Patient has been getting more agitated and not taking medications, not eating well and losing weight so wife brought patient to the hospital.  In the emergency room, he was on room air.  Chest x-ray showed multifocal pneumonia with cavitary lesions.  CT head normal.  Patient was combative.  Was given Haldol and Geodon.  CT scan shows extensive scarring and multifocal pneumonia. Remains in the hospital with altered mental status, difficulty to control behavior related to agitation and delirium. 2/18-2/19, occasionally more coherent.  Agitated at night.   Assessment & Plan:   Principal Problem:   Multifocal pneumonia Active Problems:   Hyponatremia   Tobacco use   Necrotizing pneumonia ([place])   Unintentional weight loss   Sepsis ([place])   Acute metabolic encephalopathy   AKI (acute kidney injury) ([place])   Leukocytosis  Multifocal pneumonia: Incompletely treated complicated pneumonia. Acute on chronic pneumonia with structural changes on his lungs.  Previous history of Prevotella infection. On room air.  No positive culture data yet. Patient was on vancomycin and Zosyn.  Unlikely MRSA.  Currently on Zosyn. Changed to oral antibiotics with Augmentin for total 1 month as recommended by pulmonary. We will continue respiratory therapy as much as he can do.  Staying in the hospital because of agitation issues.  Acute metabolic encephalopathy/confusion and delirium: Previous alcoholism. Still remains an issue.  Treated with high-dose thiamine and and now on maintenance.  CT head and MRI normal.  Multifactorial delirium. On escalating dose of Seroquel, behavior intermittently controlled but patient with  outbursts of agitations and impulsiveness. Need restraint to avoid trauma and fall and injuries. We will see if we can find a bedside sitter, that may help to improve agitation.  Electrolyte abnormalities: Replaced.  Continue to treat agitation and delirium.  DVT prophylaxis: enoxaparin (LOVENOX) injection 40 mg Start: 08/02/20 1000   Code Status: Full code Family Communication: Wife on the phone 3/18. Disposition Plan: Status is: Inpatient  Remains inpatient appropriate because:IV treatments appropriate due to intensity of illness or inability to take PO and Inpatient level of care appropriate due to severity of illness   Dispo: The patient is from: Home              Anticipated d/c is to: Unknown.              Patient currently is not medically stable to d/c.   Difficult to place patient No  Consultants:   PCCM  Psychiatry  Procedures:   None  Antimicrobials:  Antibiotics Given (last 72 hours)    Date/Time Action Medication Dose Rate   08/07/20 1211 New Bag/Given   piperacillin-tazobactam (ZOSYN) IVPB 3.375 g 3.375 g 12.5 mL/hr   08/07/20 2057 New Bag/Given   piperacillin-tazobactam (ZOSYN) IVPB 3.375 g 3.375 g 12.5 mL/hr   08/08/20 0539 New Bag/Given   piperacillin-tazobactam (ZOSYN) IVPB 3.375 g 3.375 g 12.5 mL/hr   08/08/20 1223 New Bag/Given   piperacillin-tazobactam (ZOSYN) IVPB 3.375 g 3.375 g 12.5 mL/hr   08/08/20 2104 New Bag/Given   piperacillin-tazobactam (ZOSYN) IVPB 3.375 g 3.375 g 12.5 mL/hr   08/09/20 9622 New Bag/Given   piperacillin-tazobactam (ZOSYN) IVPB 3.375 g 3.375 g 12.5 mL/hr   08/09/20 1208 Given  amoxicillin-clavulanate (AUGMENTIN) 875-125 MG per tablet 1 tablet 1 tablet    08/09/20 2126 Given   amoxicillin-clavulanate (AUGMENTIN) 875-125 MG per tablet 1 tablet 1 tablet    08/10/20 0834 Given   amoxicillin-clavulanate (AUGMENTIN) 875-125 MG per tablet 1 tablet 1 tablet          Subjective: Patient seen and examined.  "Hey Dr. I  need a vacation to go home".  His speech is clear.  Still has flight of ideas.  Overnight, reportedly agitated so given Haldol and back on soft restraints.   Objective: Vitals:   08/08/20 2218 08/09/20 1428 08/09/20 2119 08/10/20 0526  BP: 111/77 124/74 118/68 121/72  Pulse: 96 96 87 72  Resp: (!) 21 16 18 19   Temp: 97.7 F (36.5 C) 97.8 F (36.6 C) 98 F (36.7 C) 98.2 F (36.8 C)  TempSrc: Oral Oral Oral Oral  SpO2: 100% 100% 98% 97%  Weight:      Height:       No intake or output data in the 24 hours ending 08/10/20 1051 Filed Weights   08/01/20 2323  Weight: 61.5 kg    Examination:  General exam: Appears frail and debilitated.  Not in any distress. Patient is alert and oriented x 1-2 today.  Talking well to this provider, pleasantly confused.  Impulsive. Respiratory system: No added sounds.  Bilateral clear. Cardiovascular system: S1 & S2 heard, RRR.  Gastrointestinal system: Abdomen is nondistended, soft and nontender. No organomegaly or masses felt. Normal bowel sounds heard. Extremities: Symmetric 5 x 5 power.  Moving all extremities.   Data Reviewed: I have personally reviewed following labs and imaging studies  CBC: Recent Labs  Lab 08/04/20 0453 08/06/20 0205 08/09/20 0257  WBC 10.0 8.7 8.3  NEUTROABS 7.5 6.4 6.5  HGB 11.3* 10.1* 9.9*  HCT 34.9* 31.8* 31.2*  MCV 89.3 90.1 89.9  PLT 362 348 518   Basic Metabolic Panel: Recent Labs  Lab 08/04/20 0453 08/06/20 0205 08/09/20 0257  NA 139 142 134*  K 3.4* 3.5 3.5  CL 103 109 101  CO2 27 26 25   GLUCOSE 90 85 76  BUN 17 11 8   CREATININE 0.85 0.80 0.87  CALCIUM 8.9 8.5* 8.0*  MG 1.6* 1.6* 1.7  PHOS 3.0 2.6 2.8   GFR: Estimated Creatinine Clearance: 79.5 mL/min (by C-G formula based on SCr of 0.87 mg/dL). Liver Function Tests: Recent Labs  Lab 08/09/20 0257  AST 15  ALT 7  ALKPHOS 43  BILITOT 1.1  PROT 6.2*  ALBUMIN 1.4*   No results for input(s): LIPASE, AMYLASE in the last 168  hours. No results for input(s): AMMONIA in the last 168 hours. Coagulation Profile: No results for input(s): INR, PROTIME in the last 168 hours. Cardiac Enzymes: No results for input(s): CKTOTAL, CKMB, CKMBINDEX, TROPONINI in the last 168 hours. BNP (last 3 results) No results for input(s): PROBNP in the last 8760 hours. HbA1C: No results for input(s): HGBA1C in the last 72 hours. CBG: Recent Labs  Lab 08/05/20 0838 08/05/20 1206 08/05/20 1635 08/05/20 2109  GLUCAP 90 122* 95 73   Lipid Profile: No results for input(s): CHOL, HDL, LDLCALC, TRIG, CHOLHDL, LDLDIRECT in the last 72 hours. Thyroid Function Tests: No results for input(s): TSH, T4TOTAL, FREET4, T3FREE, THYROIDAB in the last 72 hours. Anemia Panel: No results for input(s): VITAMINB12, FOLATE, FERRITIN, TIBC, IRON, RETICCTPCT in the last 72 hours. Sepsis Labs: No results for input(s): PROCALCITON, LATICACIDVEN in the last 168 hours.  Recent Results (from  the past 240 hour(s))  Resp Panel by RT-PCR (Flu A&B, Covid) Nasopharyngeal Swab     Status: None   Collection Time: 08/01/20  3:50 AM   Specimen: Nasopharyngeal Swab; Nasopharyngeal(NP) swabs in vial transport medium  Result Value Ref Range Status   SARS Coronavirus 2 by RT PCR NEGATIVE NEGATIVE Final    Comment: (NOTE) SARS-CoV-2 target nucleic acids are NOT DETECTED.  The SARS-CoV-2 RNA is generally detectable in upper respiratory specimens during the acute phase of infection. The lowest concentration of SARS-CoV-2 viral copies this assay can detect is 138 copies/mL. A negative result does not preclude SARS-Cov-2 infection and should not be used as the sole basis for treatment or other patient management decisions. A negative result may occur with  improper specimen collection/handling, submission of specimen other than nasopharyngeal swab, presence of viral mutation(s) within the areas targeted by this assay, and inadequate number of viral copies(<138  copies/mL). A negative result must be combined with clinical observations, patient history, and epidemiological information. The expected result is Negative.  Fact Sheet for Patients:  EntrepreneurPulse.com.au  Fact Sheet for Healthcare Providers:  IncredibleEmployment.be  This test is no t yet approved or cleared by the [place] FDA and  has been authorized for detection and/or diagnosis of SARS-CoV-2 by FDA under an Emergency Use Authorization (EUA). This EUA will remain  in effect (meaning this test can be used) for the duration of the COVID-19 declaration under Section 564(b)(1) of the Act, 21 U.S.C.section 360bbb-3(b)(1), unless the authorization is terminated  or revoked sooner.       Influenza A by PCR NEGATIVE NEGATIVE Final   Influenza B by PCR NEGATIVE NEGATIVE Final    Comment: (NOTE) The Xpert Xpress SARS-CoV-2/FLU/RSV plus assay is intended as an aid in the diagnosis of influenza from Nasopharyngeal swab specimens and should not be used as a sole basis for treatment. Nasal washings and aspirates are unacceptable for Xpert Xpress SARS-CoV-2/FLU/RSV testing.  Fact Sheet for Patients: EntrepreneurPulse.com.au  Fact Sheet for Healthcare Providers: IncredibleEmployment.be  This test is not yet approved or cleared by the [place] FDA and has been authorized for detection and/or diagnosis of SARS-CoV-2 by FDA under an Emergency Use Authorization (EUA). This EUA will remain in effect (meaning this test can be used) for the duration of the COVID-19 declaration under Section 564(b)(1) of the Act, 21 U.S.C. section 360bbb-3(b)(1), unless the authorization is terminated or revoked.  Performed at [facility], [place] [address] [place], [place] [address]   Culture, blood (Routine x 2)     Status: None   Collection Time: 08/01/20 11:25 PM   Specimen: BLOOD LEFT ARM  Result Value Ref  Range Status   Specimen Description BLOOD LEFT ARM  Final   Special Requests   Final    BOTTLES DRAWN AEROBIC AND ANAEROBIC Blood Culture adequate volume   Culture   Final    NO GROWTH 5 DAYS Performed at [facility], [place] [address], [place], [place] [address]    Report Status 08/07/2020 FINAL  Final  Culture, blood (Routine x 2)     Status: None   Collection Time: 08/01/20 11:42 PM   Specimen: BLOOD RIGHT ARM  Result Value Ref Range Status   Specimen Description BLOOD RIGHT ARM  Final   Special Requests AEROBIC BOTTLE ONLY Blood Culture adequate volume  Final   Culture   Final    NO GROWTH 5 DAYS Performed at [facility], [place]  [address] [place], [place] [address]    Report Status 08/07/2020 FINAL  Final  Urine culture     Status: None   Collection Time: 08/02/20  4:23 AM   Specimen: In/Out Cath Urine  Result Value Ref Range Status   Specimen Description IN/OUT CATH URINE  Final   Special Requests NONE  Final   Culture   Final    NO GROWTH Performed at [facility], [place] [address], [place], [place] [address]    Report Status 08/03/2020 FINAL  Final  MRSA PCR Screening     Status: None   Collection Time: 08/02/20 11:07 AM   Specimen: Nasopharyngeal  Result Value Ref Range Status   MRSA by PCR NEGATIVE NEGATIVE Final    Comment:        The GeneXpert MRSA Assay (FDA approved for NASAL specimens only), is one component of a comprehensive MRSA colonization surveillance program. It is not intended to diagnose MRSA infection nor to guide or monitor treatment for MRSA infections. Performed at [facility], [place] [address], [place], [place] [address]   Culture, Respiratory w Gram Stain     Status: None   Collection Time: 08/04/20 12:56 PM   Specimen: Tracheal Aspirate  Result Value Ref Range Status   Specimen Description TRACHEAL ASPIRATE  Final   Special Requests NONE  Final   Gram Stain   Final    FEW WBC PRESENT, PREDOMINANTLY PMN RARE GRAM  POSITIVE COCCI IN PAIRS    Culture   Final    RARE Normal respiratory flora-no Staph aureus or Pseudomonas seen Performed at [facility], [place] [address], [place], [place] [address]    Report Status 08/06/2020 FINAL  Final         Radiology Studies: No results found.      Scheduled Meds: . amoxicillin-clavulanate  1 tablet Oral Q12H  . Chlorhexidine Gluconate Cloth  6 each Topical Daily  . enoxaparin (LOVENOX) injection  40 mg Subcutaneous Daily  . feeding supplement  237 mL Oral TID BM  . folic acid  1 mg Oral Daily  . magnesium oxide  400 mg Oral BID  . multivitamin with minerals  1 tablet Oral Daily  . QUEtiapine  50 mg Oral q morning   And  . QUEtiapine  100 mg Oral QHS  . thiamine  100 mg Oral Daily  . traZODone  100 mg Oral QHS   Continuous Infusions: . sodium chloride 500 mL (08/02/20 1333)     LOS: 8 days    Time spent: 30 minutes    Barb Merino, MD [facility] Pager [phone]

## 2020-08-10 NOTE — Progress Notes (Signed)
Patient repeatedly trying to get out of bed, yelling out, agitated.  Unable to reorient patient as he insists he is "going to work".  Haldol given per order for agitation

## 2020-08-10 NOTE — Progress Notes (Signed)
Patient has not had bilateral wrist restraints on since 1400 today.  He has been very cooperative and not agitated at all.  He has walked in the hallway with just a stand by assist and tolerated well.  Will continue to monitor.

## 2020-08-10 NOTE — Progress Notes (Signed)
Physician notified, no urine output so far this shift.  Bladder scan shows 176ml urine in bladder.  Abdomen soft, patient denies discomfort.  Patient has refused foods/fluids tonight.  Per physician, continue to monitor.

## 2020-08-10 NOTE — Evaluation (Signed)
Occupational Therapy Evaluation Patient Details Name: Derrick Beltran MRN: [id number] DOB: Feb 25, 1961 Today's Date: 08/10/2020    History of Present Illness 60 yo male with onset of recetn PNA was noted upon readmission to have necrotizing PNA.  Has leukocytosis, sepsis, hyponatremia, empyema and has noted refusal to eat and take meds with agitation. No changes on CT or MRI of brain.  PMHx:  AKI, PNA, tobacco use   Clinical Impression   Pt pleasant and cooperative. Mild unsteadiness without a device when ambulating to bathroom. Pt is overall functioning a supervision level. Recommending formal cognitive screening. Will follow acutely.     Follow Up Recommendations  No OT follow up    Equipment Recommendations  None recommended by OT    Recommendations for Other Services       Precautions / Restrictions Precautions Precautions: Fall      Mobility Bed Mobility Overal bed mobility: Modified Independent                  Transfers Overall transfer level: Needs assistance Equipment used: None Transfers: Sit to/from Stand Sit to Stand: Supervision              Balance Overall balance assessment: Needs assistance   Sitting balance-Leahy Scale: Good     Standing balance support: No upper extremity supported Standing balance-Leahy Scale: Fair                             ADL either performed or assessed with clinical judgement   ADL Overall ADL's : Needs assistance/impaired Eating/Feeding: Independent   Grooming: Wash/dry hands;Supervision/safety;Standing   Upper Body Bathing: Set up;Sitting   Lower Body Bathing: Supervison/ safety;Sit to/from stand   Upper Body Dressing : Set up;Sitting   Lower Body Dressing: Sit to/from stand;Supervision/safety   Toilet Transfer: Supervision/safety;Ambulation;Regular Toilet   Toileting- Water quality scientist and Hygiene: Supervision/safety;Sit to/from stand       Functional mobility during ADLs:  Supervision/safety       Vision Baseline Vision/History: No visual deficits Patient Visual Report: No change from baseline       Perception     Praxis      Pertinent Vitals/Pain Pain Assessment: No/denies pain     Hand Dominance Right   Extremity/Trunk Assessment Upper Extremity Assessment Upper Extremity Assessment: Overall WFL for tasks assessed   Lower Extremity Assessment Lower Extremity Assessment: Defer to PT evaluation   Cervical / Trunk Assessment Cervical / Trunk Assessment: Kyphotic   Communication Communication Communication: No difficulties   Cognition Arousal/Alertness: Awake/alert Behavior During Therapy: WFL for tasks assessed/performed Overall Cognitive Status: No family/caregiver present to determine baseline cognitive functioning                                 General Comments: pt pleasant and out of restraints   General Comments       Exercises     Shoulder Instructions      Home Living Family/patient expects to be discharged to:: Private residence Living Arrangements: Spouse/significant other Available Help at Discharge: Family Type of Home: House                                  Prior Functioning/Environment Level of Independence: Independent        Comments: reports having wife at home in good health, pt  used to Education administrator Problem List: Impaired balance (sitting and/or standing);Decreased cognition;Decreased safety awareness      OT Treatment/Interventions: Self-care/ADL training    OT Goals(Current goals can be found in the care plan section) Acute Rehab OT Goals Patient Stated Goal: to go home tomorrow OT Goal Formulation: With patient Time For Goal Achievement: 08/24/20 Potential to Achieve Goals: Good ADL Goals Pt Will Perform Grooming: with modified independence;standing (3 activities) Pt Will Perform Lower Body Bathing: with modified independence;sit to/from  stand Pt Will Perform Lower Body Dressing: with modified independence;sit to/from stand Pt Will Transfer to Toilet: with modified independence;ambulating;regular height toilet Pt Will Perform Toileting - Clothing Manipulation and hygiene: with modified independence;sit to/from stand Additional ADL Goal #1: Pt will identify and gather items necessary for ADL around his room modified independently.  OT Frequency: Min 2X/week   Barriers to D/C:            Co-evaluation              AM-PAC OT "6 Clicks" Daily Activity     Outcome Measure Help from another person eating meals?: None Help from another person taking care of personal grooming?: A Little Help from another person toileting, which includes using toliet, bedpan, or urinal?: A Little Help from another person bathing (including washing, rinsing, drying)?: A Little Help from another person to put on and taking off regular upper body clothing?: A Little Help from another person to put on and taking off regular lower body clothing?: A Little 6 Click Score: 19   End of Session    Activity Tolerance: Patient tolerated treatment well Patient left: in bed;with call bell/phone within reach;with bed alarm set  OT Visit Diagnosis: Other abnormalities of gait and mobility (R26.89);Other symptoms and signs involving cognitive function                Time: 1355-1408 OT Time Calculation (min): 13 min Charges:  OT General Charges $OT Visit: 1 Visit OT Evaluation $OT Eval Low Complexity: 1 Low  Nestor Lewandowsky, OTR/L Acute Rehabilitation Services Pager: [phone] Office: [phone]  Malka So 08/10/2020, 3:02 PM

## 2020-08-11 MED ORDER — LIP MEDEX EX OINT
1.0000 "application " | TOPICAL_OINTMENT | CUTANEOUS | Status: DC | PRN
  Administered 2020-08-11: 1 via TOPICAL
  Filled 2020-08-11: qty 7

## 2020-08-11 NOTE — Progress Notes (Signed)
PROGRESS NOTE    Derrick Beltran  YTK:[id number] DOB: [birth year] DOA: 08/01/2020 PCP: Denyce Robert, FNP    Brief Narrative:  60 year old gentleman with history of poor compliance, recurrent pneumonia and empyema who was recently diagnosed with another bout of pneumonia and treated with Bactrim.  Patient has been getting more agitated and not taking medications, not eating well and losing weight so wife brought patient to the hospital.  In the emergency room, he was on room air.  Chest x-ray showed multifocal pneumonia with cavitary lesions.  CT head normal.  Patient was combative.  Was given Haldol and Geodon.  CT scan shows extensive scarring and multifocal pneumonia.  Remains in the hospital with altered mental status, difficulty to control behavior related to agitation and delirium. 2/18-2/19, occasionally more coherent.  Some agitation at night.  3/20, behavior much controlled.  Not needing any restraint.  On increasing dose of Seroquel.   Assessment & Plan:   Principal Problem:   Multifocal pneumonia Active Problems:   Hyponatremia   Tobacco use   Necrotizing pneumonia ([place])   Unintentional weight loss   Sepsis ([place])   Acute metabolic encephalopathy   AKI (acute kidney injury) ([place])   Leukocytosis  Multifocal pneumonia: Incompletely treated complicated pneumonia. Acute on chronic pneumonia with structural changes on his lungs.  Previous history of Prevotella infection. On room air.  No positive culture data yet. Patient was on vancomycin and Zosyn.  Unlikely MRSA.  Currently on Zosyn. Changed to oral antibiotics with Augmentin for total 1 month as recommended by pulmonary. We will continue respiratory therapy as much as he can do.  Staying in the hospital because of agitation issues.  Acute metabolic encephalopathy/confusion and delirium: Previous alcoholism. Treated with high-dose thiamine and now on maintenance.  CT head and MRI brain normal.   May have developed  alcohol-related cognitive issues.   Initially needed restraints and IV /IM Haldol for agitation and impulsive behavior.   On increasing doses of Seroquel, last 24 hours his behavior is better controlled.   Continue monitoring.  Mobility.  Try to avoid restraints.   Will reconsult psychiatry, may benefit with long-term medication management.  Electrolyte abnormalities: Replaced.  Continue to treat agitation and delirium to find safe discharge home.  DVT prophylaxis: enoxaparin (LOVENOX) injection 40 mg Start: 08/02/20 1000   Code Status: Full code Family Communication: None today. Disposition Plan: Status is: Inpatient  Remains inpatient appropriate because:IV treatments appropriate due to intensity of illness or inability to take PO and Inpatient level of care appropriate due to severity of illness   Dispo: The patient is from: Home              Anticipated d/c is to: Unknown.              Patient currently is not medically stable to d/c.   Difficult to place patient No  Consultants:   PCCM  Psychiatry  Procedures:   None  Antimicrobials:  Antibiotics Given (last 72 hours)    Date/Time Action Medication Dose Rate   08/08/20 1223 New Bag/Given   piperacillin-tazobactam (ZOSYN) IVPB 3.375 g 3.375 g 12.5 mL/hr   08/08/20 2104 New Bag/Given   piperacillin-tazobactam (ZOSYN) IVPB 3.375 g 3.375 g 12.5 mL/hr   08/09/20 0608 New Bag/Given   piperacillin-tazobactam (ZOSYN) IVPB 3.375 g 3.375 g 12.5 mL/hr   08/09/20 1208 Given   amoxicillin-clavulanate (AUGMENTIN) 875-125 MG per tablet 1 tablet 1 tablet    08/09/20 2126 Given   amoxicillin-clavulanate (AUGMENTIN) 875-125  MG per tablet 1 tablet 1 tablet    08/10/20 0834 Given   amoxicillin-clavulanate (AUGMENTIN) 875-125 MG per tablet 1 tablet 1 tablet    08/10/20 2151 Given   amoxicillin-clavulanate (AUGMENTIN) 875-125 MG per tablet 1 tablet 1 tablet    08/11/20 0815 Given   amoxicillin-clavulanate (AUGMENTIN) 875-125 MG  per tablet 1 tablet 1 tablet          Subjective: Patient seen and examined.  Surprisingly he looks more composed and want to get out of the bed and walk around.  He has slow pressured speech otherwise able to keep up conversation, oriented to place and situation but not to the time.  No other overnight events.  Wants to walk.   Objective: Vitals:   08/10/20 0526 08/10/20 1116 08/10/20 1947 08/11/20 0355  BP: 121/72 124/72 134/77 119/66  Pulse: 72  89 89  Resp: 19  20 16   Temp: 98.2 F (36.8 C) 98.7 F (37.1 C) 97.8 F (36.6 C) 97.7 F (36.5 C)  TempSrc: Oral Oral Oral Oral  SpO2: 97% 97% 98% 100%  Weight:      Height:        Intake/Output Summary (Last 24 hours) at 08/11/2020 1034 Last data filed at 08/11/2020 0403 Gross per 24 hour  Intake --  Output 300 ml  Net -300 ml   Filed Weights   08/01/20 2323  Weight: 61.5 kg    Examination:  General exam: Appears frail and debilitated.  Not in any distress. Pleasant to conversation.  Alert and oriented to place and situation.  Not oriented to time.  No agitation.  Denies any hallucinations or delusions. Respiratory system: No added sounds.  Bilateral clear. Cardiovascular system: S1 & S2 heard, RRR.  Gastrointestinal system: Abdomen is nondistended, soft and nontender. No organomegaly or masses felt. Normal bowel sounds heard. Extremities: Symmetric 5 x 5 power.  Walking in the hallway.   Data Reviewed: I have personally reviewed following labs and imaging studies  CBC: Recent Labs  Lab 08/06/20 0205 08/09/20 0257  WBC 8.7 8.3  NEUTROABS 6.4 6.5  HGB 10.1* 9.9*  HCT 31.8* 31.2*  MCV 90.1 89.9  PLT 348 062   Basic Metabolic Panel: Recent Labs  Lab 08/06/20 0205 08/09/20 0257  NA 142 134*  K 3.5 3.5  CL 109 101  CO2 26 25  GLUCOSE 85 76  BUN 11 8  CREATININE 0.80 0.87  CALCIUM 8.5* 8.0*  MG 1.6* 1.7  PHOS 2.6 2.8   GFR: Estimated Creatinine Clearance: 79.5 mL/min (by C-G formula based on SCr of  0.87 mg/dL). Liver Function Tests: Recent Labs  Lab 08/09/20 0257  AST 15  ALT 7  ALKPHOS 43  BILITOT 1.1  PROT 6.2*  ALBUMIN 1.4*   No results for input(s): LIPASE, AMYLASE in the last 168 hours. No results for input(s): AMMONIA in the last 168 hours. Coagulation Profile: No results for input(s): INR, PROTIME in the last 168 hours. Cardiac Enzymes: No results for input(s): CKTOTAL, CKMB, CKMBINDEX, TROPONINI in the last 168 hours. BNP (last 3 results) No results for input(s): PROBNP in the last 8760 hours. HbA1C: No results for input(s): HGBA1C in the last 72 hours. CBG: Recent Labs  Lab 08/05/20 0838 08/05/20 1206 08/05/20 1635 08/05/20 2109  GLUCAP 90 122* 95 73   Lipid Profile: No results for input(s): CHOL, HDL, LDLCALC, TRIG, CHOLHDL, LDLDIRECT in the last 72 hours. Thyroid Function Tests: No results for input(s): TSH, T4TOTAL, FREET4, T3FREE, THYROIDAB in the  last 72 hours. Anemia Panel: No results for input(s): VITAMINB12, FOLATE, FERRITIN, TIBC, IRON, RETICCTPCT in the last 72 hours. Sepsis Labs: No results for input(s): PROCALCITON, LATICACIDVEN in the last 168 hours.  Recent Results (from the past 240 hour(s))  Culture, blood (Routine x 2)     Status: None   Collection Time: 08/01/20 11:25 PM   Specimen: BLOOD LEFT ARM  Result Value Ref Range Status   Specimen Description BLOOD LEFT ARM  Final   Special Requests   Final    BOTTLES DRAWN AEROBIC AND ANAEROBIC Blood Culture adequate volume   Culture   Final    NO GROWTH 5 DAYS Performed at [facility], [address], [place], [place] [address]    Report Status 08/07/2020 FINAL  Final  Culture, blood (Routine x 2)     Status: None   Collection Time: 08/01/20 11:42 PM   Specimen: BLOOD RIGHT ARM  Result Value Ref Range Status   Specimen Description BLOOD RIGHT ARM  Final   Special Requests AEROBIC BOTTLE ONLY Blood Culture adequate volume  Final   Culture   Final    NO GROWTH 5  DAYS Performed at [facility], [place] [address], [place], [place] [address]    Report Status 08/07/2020 FINAL  Final  Urine culture     Status: None   Collection Time: 08/02/20  4:23 AM   Specimen: In/Out Cath Urine  Result Value Ref Range Status   Specimen Description IN/OUT CATH URINE  Final   Special Requests NONE  Final   Culture   Final    NO GROWTH Performed at [facility], [place] [address] [place], [place] [address]    Report Status 08/03/2020 FINAL  Final  MRSA PCR Screening     Status: None   Collection Time: 08/02/20 11:07 AM   Specimen: Nasopharyngeal  Result Value Ref Range Status   MRSA by PCR NEGATIVE NEGATIVE Final    Comment:        The GeneXpert MRSA Assay (FDA approved for NASAL specimens only), is one component of a comprehensive MRSA colonization surveillance program. It is not intended to diagnose MRSA infection nor to guide or monitor treatment for MRSA infections. Performed at [facility], [place] [address] [place], [place] [address]   Culture, Respiratory w Gram Stain     Status: None   Collection Time: 08/04/20 12:56 PM   Specimen: Tracheal Aspirate  Result Value Ref Range Status   Specimen Description TRACHEAL ASPIRATE  Final   Special Requests NONE  Final   Gram Stain   Final    FEW WBC PRESENT, PREDOMINANTLY PMN RARE GRAM POSITIVE COCCI IN PAIRS    Culture   Final    RARE Normal respiratory flora-no Staph aureus or Pseudomonas seen Performed at [facility], [place] [address], [place], [place] [address]    Report Status 08/06/2020 FINAL  Final         Radiology Studies: No results found.      Scheduled Meds:  amoxicillin-clavulanate  1 tablet Oral Q12H   Chlorhexidine Gluconate Cloth  6 each Topical Daily   enoxaparin (LOVENOX) injection  40 mg Subcutaneous Daily   feeding supplement  237 mL Oral TID BM   folic acid  1 mg Oral Daily   magnesium oxide  400 mg Oral BID   multivitamin with minerals   1 tablet Oral Daily   QUEtiapine  100 mg Oral q  morning   And   QUEtiapine  200 mg Oral QHS   thiamine  100 mg Oral Daily   traZODone  100 mg Oral QHS   Continuous Infusions:  sodium chloride 500 mL (08/02/20 1333)     LOS: 9 days    Time spent: 30 minutes    Barb Merino, MD [facility] Pager [phone]

## 2020-08-11 NOTE — Consult Note (Signed)
[facility] Psychiatry Consult   Reason for Consult:  "Patient with delirium and agitation, improved on increasing dose of Seroquel. He is better able to participate on interview, will need complete psychiatric assessment and long-term management recommendations." Referring Physician:  Dr. Sloan Leiter Patient Identification: Derrick Beltran MRN:  [id number] Principal Diagnosis: Multifocal pneumonia Diagnosis:  Principal Problem:   Multifocal pneumonia Active Problems:   Hyponatremia   Tobacco use   Necrotizing pneumonia ([place])   Unintentional weight loss   Sepsis ([place])   Acute metabolic encephalopathy   AKI (acute kidney injury) ([place])   Leukocytosis   Total Time spent with patient: 30 minutes  HPI:   Derrick Beltran is a 60 y.o. male patient admitted with5 history of poor compliance, recurrent pneumonia and empyema who was recently diagnosed with another bout of pneumonia and treated with Bactrim. Patient has been getting more agitated and not taking medications, not eating well and losing weight so wife brought patient to the hospital. In the emergency room, he was on room air. Chest x-ray showed multifocal pneumonia with cavitary lesions. CT head normal. Patient was combative. Was given Haldol and Geodon. CT scan shows extensive scarring and multifocal areas of pneumonia.  Patient was seen earlier in hospitalization by psychiatry service for delirium; recommendations were made for scheduled and PRN seroquel. Patient's mentation has since improved per primary team. Patient last received haldol 5 mg on 3/19 at 323 and PRN seroquel 25 mg on 3/19 at 1114.  Patient interviewed this AM in his room. He is calm, cooperative and pleasant. Pt with some disorientation; he initially states that he is in "[place]" but then later states he is in "[place]". He is oriented to self, situation, and month. He states that the year is 2023. Per chart review, patient's mentation appears to have improved  significantly. Pt is aware that he is in the hospital but states that he was "forced" to come here which is also somewhat consistent with chart review as he was accompanied by his wide when presenting with AMS. He reports sleeping well overnight. He denies any psychiatric history; denies seeing a psychiatrist in the past, taking medications, or having a psychiatric hospitalization. He denies SI/HI/AVH. He reports a remote h/o alcohol use but denies recent use. He denies feeling depressed,   Objectively, there is no evidence of psychosis/ mania (able to converse coherently, linear and goal directed thought, no RIS, no distractibility, not pre-occupied, no FOI, etc) nor depression to the point of suicidality (able to concentrate, affect full and reactive, speech normal r/v/t, no psychomotor retardation/agitation, etc).   Past Psychiatric History: denies  Risk to Self:  no Risk to Others:  no Prior Inpatient Therapy:  denies Prior Outpatient Therapy:  denies  Past Medical History:  Past Medical History:  Diagnosis Date  . Empyema ([place]) 03/03/2018; 03/14/2018  . GERD (gastroesophageal reflux disease)     Past Surgical History:  Procedure Laterality Date  . NO PAST SURGERIES     Family History:  Family History  Problem Relation Age of Onset  . Alzheimer's disease Mother   . Alzheimer's disease Father    Family Psychiatric  History: denies Social History:  Social History   Substance and Sexual Activity  Alcohol Use Yes   Comment: 03/03/2018 "3-4 / day and more on weekends "' 05/22/20 avg 1-2 daily     Social History   Substance and Sexual Activity  Drug Use Never    Social History   Socioeconomic History  . Marital status:  Married    Spouse name: Not on file  . Number of children: Not on file  . Years of education: Not on file  . Highest education level: Not on file  Occupational History  . Not on file  Tobacco Use  . Smoking status: Current Every Day Smoker     Packs/day: 1.00    Years: 45.00    Pack years: 45.00    Types: Cigarettes  . Smokeless tobacco: Never Used  Vaping Use  . Vaping Use: Never used  Substance and Sexual Activity  . Alcohol use: Yes    Comment: 03/03/2018 "3-4 / day and more on weekends "' 05/22/20 avg 1-2 daily  . Drug use: Never  . Sexual activity: Not Currently  Other Topics Concern  . Not on file  Social History Narrative  . Not on file   Social Determinants of Health   Financial Resource Strain: Not on file  Food Insecurity: Not on file  Transportation Needs: Not on file  Physical Activity: Not on file  Stress: Not on file  Social Connections: Not on file   Additional Social History:    Allergies:  No Known Allergies  Labs: No results found for this or any previous visit (from the past 48 hour(s)).  Current Facility-Administered Medications  Medication Dose Route Frequency Provider Last Rate Last Admin  . acetaminophen (TYLENOL) tablet 650 mg  650 mg Oral Q6H PRN Chotiner, Yevonne Aline, MD   650 mg at 08/04/20 1045   Or  . acetaminophen (TYLENOL) suppository 650 mg  650 mg Rectal Q6H PRN Chotiner, Yevonne Aline, MD      . albuterol (PROVENTIL) (2.5 MG/3ML) 0.083% nebulizer solution 2.5 mg  2.5 mg Nebulization Q2H PRN Chotiner, Yevonne Aline, MD      . amoxicillin-clavulanate (AUGMENTIN) 875-125 MG per tablet 1 tablet  1 tablet Oral Q12H Barb Merino, MD   1 tablet at 08/11/20 0815  . Chlorhexidine Gluconate Cloth 2 % PADS 6 each  6 each Topical Daily Mesner, Corene Cornea, MD   6 each at 08/11/20 0815  . enoxaparin (LOVENOX) injection 40 mg  40 mg Subcutaneous Daily Chotiner, Yevonne Aline, MD   40 mg at 08/11/20 0815  . feeding supplement (ENSURE ENLIVE / ENSURE PLUS) liquid 237 mL  237 mL Oral TID BM Barb Merino, MD   237 mL at 08/08/20 2105  . folic acid (FOLVITE) tablet 1 mg  1 mg Oral Daily Wendee Beavers, RPH   1 mg at 08/11/20 0263  . haloperidol lactate (HALDOL) injection 5 mg  5 mg Intramuscular Q6H PRN Barb Merino, MD   5 mg at 08/10/20 0323  . magnesium oxide (MAG-OX) tablet 400 mg  400 mg Oral BID Barb Merino, MD   400 mg at 08/11/20 0815  . multivitamin with minerals tablet 1 tablet  1 tablet Oral Daily Barb Merino, MD   1 tablet at 08/11/20 0816  . ondansetron (ZOFRAN) tablet 4 mg  4 mg Oral Q6H PRN Chotiner, Yevonne Aline, MD       Or  . ondansetron (ZOFRAN) injection 4 mg  4 mg Intravenous Q6H PRN Chotiner, Yevonne Aline, MD      . QUEtiapine (SEROQUEL) tablet 100 mg  100 mg Oral q morning Barb Merino, MD   100 mg at 08/11/20 0817   And  . QUEtiapine (SEROQUEL) tablet 200 mg  200 mg Oral QHS Barb Merino, MD   200 mg at 08/10/20 2152  . QUEtiapine (SEROQUEL) tablet 25 mg  25 mg Oral BID PRN Suella Broad, FNP   25 mg at 08/10/20 1114  . sodium chloride 0.9 % bolus 500 mL  500 mL Intravenous PRN Oswald Hillock, MD 500 mL/hr at 08/02/20 1333 500 mL at 08/02/20 1333  . thiamine tablet 100 mg  100 mg Oral Daily Barb Merino, MD   100 mg at 08/11/20 0816  . traZODone (DESYREL) tablet 100 mg  100 mg Oral QHS Candee Furbish, MD   100 mg at 08/10/20 2152    Musculoskeletal: Strength & Muscle Tone: within normal limits Gait & Station: normal Patient leans: N/A            Psychiatric Specialty Exam:  Presentation  General Appearance: Appropriate for Environment; Casual  Eye Contact:Fair  Speech:Clear and Coherent  Speech Volume:Normal  Handedness:No data recorded  Mood and Affect  Mood:Euthymic  Affect:Appropriate; Congruent   Thought Process  Thought Processes:Coherent; Goal Directed; Linear  Descriptions of Associations:Intact  Orientation:Partial (oriented to self, month. Not location or year)  Thought Content:No data recorded History of Schizophrenia/Schizoaffective disorder:No data recorded Duration of Psychotic Symptoms:No data recorded Hallucinations:Hallucinations: None  Ideas of Reference:None  Suicidal Thoughts:Suicidal Thoughts:  No  Homicidal Thoughts:Homicidal Thoughts: No   Sensorium  Memory:Immediate Fair; Recent Fair  Judgment:Fair  Insight:Fair   Executive Functions  Concentration:Fair  Attention Span:Fair  [place]   Psychomotor Activity  Psychomotor Activity:Psychomotor Activity: Normal   Assets  Assets:Communication Skills; Desire for Improvement; Social Support   Sleep  Sleep:Sleep: Fair   Physical Exam: Physical Exam Constitutional:      Appearance: Normal appearance.     Comments: Thin appearing  HENT:     Head: Atraumatic.  Pulmonary:     Effort: Pulmonary effort is normal.  Neurological:     Mental Status: He is alert.    Review of Systems  Eyes: Negative for discharge and redness.  Respiratory: Negative for cough.   Cardiovascular: Negative for chest pain.  Gastrointestinal: Negative for abdominal pain.  Neurological: Negative for sensory change and speech change.  Psychiatric/Behavioral: Negative for depression, substance abuse and suicidal ideas.   Blood pressure (!) 104/55, pulse 84, temperature 97.8 F (36.6 C), temperature source Oral, resp. rate 18, height 5\' 9"  (1.753 m), weight 61.5 kg, SpO2 100 %. Body mass index is 20.02 kg/m.  Treatment Plan Summary:  Patient's mentation appears to be improving with increased doses of scheduled seroquel based on chart review. Patient has been less agitated and has not received PRN medications in the last 24 hours. Patient would benefit from continuing current medication regimen as it appears to be effective. -in terms of long term medications, patient may benefit from seroquel 100 mg daily and 200 mg qhs on discharge as patient's can have persistent delirium that continues beyond hospital course. This medication can be adjusted by outpatient provider as appropriate. -Per documentation, patient has a family history of alzheimer's disease in both his mother and father; patient  may benefit from outpatient neurology services after discharge for evaluation of dementia as cognitive deficits could be contributing to inconsistent medication compliance resulting in hospitalizations.  Disposition: No evidence of imminent risk to self or others at present.   Patient does not meet criteria for psychiatric inpatient admission. Per primary team  Recommendations communicated to Dr. Sloan Leiter via secure epic chat. Psychiatry will sign off. Please reconsult if further assistance is needed.  Ival Bible, MD 08/11/2020 12:53 PM

## 2020-08-12 ENCOUNTER — Other Ambulatory Visit: Payer: Self-pay | Admitting: Internal Medicine

## 2020-08-12 MED ORDER — QUETIAPINE FUMARATE 100 MG PO TABS: 100.0000 mg | ORAL_TABLET | Freq: Every morning | ORAL | 0 refills | Status: DC

## 2020-08-12 MED ORDER — AMOXICILLIN-POT CLAVULANATE 875-125 MG PO TABS: 1.0000 | ORAL_TABLET | Freq: Two times a day (BID) | ORAL | 0 refills | Status: AC

## 2020-08-12 MED ORDER — QUETIAPINE FUMARATE 200 MG PO TABS: 200.0000 mg | ORAL_TABLET | Freq: Every day | ORAL | 0 refills | Status: DC

## 2020-08-12 MED FILL — QUETIAPINE FUMARATE 200 MG: 200 | 30 days supply | Qty: 30 | Fill #0

## 2020-08-12 MED FILL — QUETIAPINE FUMARATE 100 MG: 100 | 30 days supply | Qty: 30 | Fill #0

## 2020-08-12 MED FILL — AMOX-CLAV 875-125 MG TABLET: 875-125 | 19 days supply | Qty: 38 | Fill #0

## 2020-08-12 NOTE — Discharge Instructions (Signed)
Community-Acquired Pneumonia, Adult Pneumonia is an infection of the lungs. It causes irritation and swelling in the airways of the lungs. Mucus and fluid may also build up inside the airways. This may cause coughing and trouble breathing. One type of pneumonia can happen while you are in a hospital. A different type can happen when you are not in a hospital (community-acquired pneumonia). What are the causes? This condition is caused by germs (viruses, bacteria, or fungi). Some types of germs can spread from person to person. Pneumonia is not thought to spread from person to person.   What increases the risk? You are more likely to develop this condition if:  You have a long-term (chronic) disease, such as: ? Disease of the lungs. This may be chronic obstructive pulmonary disease (COPD) or asthma. ? Heart failure. ? Cystic fibrosis. ? Diabetes. ? Kidney disease. ? Sickle cell disease. ? HIV.  You have other health problems, such as: ? Your body's defense system (immune system) is weak. ? A condition that may cause you to breathe in fluids from your mouth and nose.  You had your spleen taken out.  You do not take good care of your teeth and mouth (poor dental hygiene).  You use or have used tobacco products.  You travel where the germs that cause this illness are common.  You are near certain animals or the places they live.  You are older than 60 years of age. What are the signs or symptoms? Symptoms of this condition include:  A cough.  A fever.  Sweating or chills.  Chest pain, often when you breathe deeply or cough.  Breathing problems, such as: ? Fast breathing. ? Trouble breathing. ? Shortness of breath.  Feeling tired (fatigued).  Muscle aches. How is this treated? Treatment for this condition depends on many things, such as:  The cause of your illness.  Your medicines.  Your other health problems. Most adults can be treated at home. Sometimes,  treatment must happen in a hospital.  Treatment may include medicines to kill germs.  Medicines may depend on which germ caused your illness. Very bad pneumonia is rare. If you get it, you may:  Have a machine to help you breathe.  Have fluid taken away from around your lungs. Follow these instructions at home: Medicines  Take over-the-counter and prescription medicines only as told by your doctor.  Take cough medicine only if you are losing sleep. Cough medicine can keep your body from taking mucus away from your lungs.  If you were prescribed an antibiotic medicine, take it as told by your doctor. Do not stop taking the antibiotic even if you start to feel better. Lifestyle  Do not drink alcohol.  Do not use any products that contain nicotine or tobacco, such as cigarettes, e-cigarettes, and chewing tobacco. If you need help quitting, ask your doctor.  Eat a healthy diet. This includes a lot of vegetables, fruits, whole grains, low-fat dairy products, and low-fat (lean) protein.      General instructions  Rest a lot. Sleep for at least 8 hours each night.  Sleep with your head and neck raised. Put a few pillows under your head or sleep in a reclining chair.  Return to your normal activities as told by your doctor. Ask your doctor what activities are safe for you.  Drink enough fluid to keep your pee (urine) pale yellow.  If your throat is sore, rinse your mouth often with salt water. To make salt   water, dissolve -1 tsp (3-6 g) of salt in 1 cup (237 mL) of warm water.  Keep all follow-up visits as told by your doctor. This is important.   How is this prevented? You can lower your risk of pneumonia by:  Getting the pneumonia shot (vaccine). These shots have different types and schedules. Ask your doctor what works best for you. Think about getting this shot if: ? You are older than 60 years of age. ? You are 19-65 years of age and:  You are being treated for  cancer.  You have long-term lung disease.  You have other problems that affect your body's defense system. Ask your doctor if you have one of these.  Getting your flu shot every year. Ask your doctor which type of shot is best for you.  Going to the dentist as often as told.  Washing your hands often with soap and water for at least 20 seconds. If you cannot use soap and water, use hand sanitizer. Contact a doctor if:  You have a fever.  You lose sleep because your cough medicine does not help. Get help right away if:  You are short of breath and this gets worse.  You have more chest pain.  Your sickness gets worse. This is very serious if: ? You are an older adult. ? Your body's defense system is weak.  You cough up blood. These symptoms may be an emergency. Do not wait to see if the symptoms will go away. Get medical help right away. Call your local emergency services (911 in the [place] Do not drive yourself to the hospital. Summary  Pneumonia is an infection of the lungs.  Community-acquired pneumonia affects people who have not been in the hospital. Certain germs can cause this infection.  This condition may be treated with medicines that kill germs.  For very bad pneumonia, you may need a hospital stay and treatment to help with breathing. This information is not intended to replace advice given to you by your health care provider. Make sure you discuss any questions you have with your health care provider. Document Revised: 02/21/2019 Document Reviewed: 02/21/2019 Elsevier Patient Education  2021 [employer]  

## 2020-08-12 NOTE — TOC Initial Note (Addendum)
Transition of Care [facility]) - Initial/Assessment Note    Patient Details  Name: Rachard Isidro MRN: [id number] Date of Birth: 05/12/1961  [facility]) CM/SW Contact:    Angelita Ingles, RN Phone Number: [phone]  08/12/2020, 10:45 AM  Clinical Narrative:                 [facility] consulted for medication assistance for patient that has no insurance. MATCH completed and meds to be sent to [place]. Patient is from home and will return home with his wife. No other needs noted at this time. TOC will sign off.         Patient Goals and CMS Choice        Expected Discharge Plan and Services           Expected Discharge Date: 08/12/20                                    Prior Living Arrangements/Services                       Activities of Daily Living Home Assistive Devices/Equipment: Other (Comment) (unsure) ADL Screening (condition at time of admission) Patient's cognitive ability adequate to safely complete daily activities?: Yes Is the patient deaf or have difficulty hearing?: No Does the patient have difficulty seeing, even when wearing glasses/contacts?: No Does the patient have difficulty concentrating, remembering, or making decisions?: Yes Patient able to express need for assistance with ADLs?: No Does the patient have difficulty dressing or bathing?: Yes Independently performs ADLs?: No Communication: Needs assistance Dressing (OT): Dependent Grooming: Dependent Feeding: Dependent Bathing: Dependent Toileting: Dependent In/Out Bed: Dependent Walks in Home: Dependent Does the patient have difficulty walking or climbing stairs?: Yes Weakness of Legs: None Weakness of Arms/Hands: None  Permission Sought/Granted                  Emotional Assessment              Admission diagnosis:  Dehydration [E86.0] Recurrent pneumonia [J18.9] Multifocal pneumonia [J18.9] Sepsis, due to unspecified organism, unspecified whether  acute organ dysfunction present [facility]) [A41.9] Patient Active Problem List   Diagnosis Date Noted  . Multifocal pneumonia 08/02/2020  . Unintentional weight loss 08/02/2020  . Sepsis ([place]) 08/02/2020  . Acute metabolic encephalopathy 36/62/9476  . AKI (acute kidney injury) ([place]) 08/02/2020  . Leukocytosis 08/02/2020  . Necrotizing pneumonia ([place]) 05/02/2018  . Malnutrition of moderate degree 03/17/2018  . Hyponatremia 03/04/2018  . Pulmonary nodule 03/04/2018  . Aortic atherosclerosis ([place]) 03/04/2018  . Tobacco use 03/04/2018  . Pleural empyema ([place]) 03/03/2018   PCP:  Denyce Robert, FNP Pharmacy:   [id number] - [place], [place] - [address] [place] [address] Phone: [phone] Fax: [phone]  [facility] [place], [place] - [address] [place] [address] Phone: [phone] Fax: [phone]     Social Determinants of Health (SDOH) Interventions    Readmission Risk Interventions No flowsheet data found.

## 2020-08-12 NOTE — Discharge Summary (Signed)
Physician Discharge Summary  Derrick Beltran QMV:[id number] DOB: January 14, 1961 DOA: 08/01/2020  PCP: Denyce Robert, FNP  Admit date: 08/01/2020 Discharge date: 08/12/2020  Admitted From: Home Disposition: Home  Recommendations for Outpatient Follow-up:  1. Follow up with PCP in 1-2 weeks 2. Follow-up at pulmonary clinic as a scheduled. 3. We will send referral to neurology for evaluation.  Home Health: Not applicable Equipment/Devices: Not needed  Discharge Condition: Stable CODE STATUS: Full code Diet recommendation: Regular diet  Discharge summary: 60 year old gentleman with history of poor compliance, recurrent pneumonia and empyema who was recently diagnosed with another bout of pneumonia and treated with Bactrim.  Patient has been getting more agitated and not taking medications, not eating well and losing weight so wife brought patient to the hospital.  In the emergency room, he was on room air.  Chest x-ray showed multifocal pneumonia with cavitary lesions.  CT head normal.  Patient was combative.  Was given Haldol and Geodon.  CT scan shows extensive scarring and multifocal pneumonia.  Remained in the hospital with altered mental status, difficulty to control behavior related to agitation and delirium.  Treated with different medications including psychiatric consultation.  On escalating doses of Seroquel, his behavior is much controlled now.  Has adequate support at home so going home with his wife.   # Multifocal pneumonia: Incompletely treated complicated pneumonia. Acute on chronic pneumonia with structural changes on his lungs.  Previous history of Prevotella infection. On room air.  No positive culture data yet. Patient was on vancomycin and Zosyn.  Unlikely MRSA.  Subsequently treated with Zosyn.  As per pulmonary recommendation, total 1 month of antibiotics.  He has finished 11 days of therapy, will prescribe 19 more days of Augmentin. He has a scheduled follow-up with  [facility].   # Acute metabolic encephalopathy/confusion and delirium: Previous alcoholism. Treated with high-dose thiamine and now on maintenance.  CT head and MRI brain normal.   May have developed alcohol-related cognitive issues.   Initially needed restraints and IV /IM Haldol for agitation and impulsive behavior.   On increasing doses of Seroquel and better stabilized now.  Seen by psychiatry in the hospital.  Recommended discharge on Seroquel doses. Much improved, still with cognitive deficits.  They recommended formal neurological evaluation, will send referral to neurology.  Patient is adequately stabilized.  Discussed with his wife and she has seen him and she thinks he is much better and she can help him at home.  Medication assistance provided.  Discharge Diagnoses:  Principal Problem:   Multifocal pneumonia Active Problems:   Hyponatremia   Tobacco use   Necrotizing pneumonia ([place])   Unintentional weight loss   Sepsis ([place])   Acute metabolic encephalopathy   AKI (acute kidney injury) ([place])   Leukocytosis    Discharge Instructions  Discharge Instructions    Ambulatory referral to Neurology   Complete by: As directed    An appointment is requested in approximately: 4 weeks   Call MD for:  difficulty breathing, headache or visual disturbances   Complete by: As directed    Diet general   Complete by: As directed    Increase activity slowly   Complete by: As directed      Allergies as of 08/12/2020   No Known Allergies     Medication List    STOP taking these medications   buPROPion 100 MG 12 hr tablet Commonly known as: WELLBUTRIN SR   sodium chloride 1 g tablet   sulfamethoxazole-trimethoprim 800-160 MG  tablet Commonly known as: BACTRIM DS     TAKE these medications   albuterol 108 (90 Base) MCG/ACT inhaler Commonly known as: VENTOLIN HFA Inhale 2 puffs into the lungs every 4 (four) hours as needed for wheezing or shortness of breath.    amoxicillin-clavulanate 875-125 MG tablet Commonly known as: AUGMENTIN Take 1 tablet by mouth every 12 (twelve) hours for 19 days.   guaiFENesin-codeine 100-10 MG/5ML syrup Take 5 mLs by mouth 2 (two) times daily as needed for cough.   Hydrocortisone (Perianal) 1 % Crea Apply 1 application topically daily as needed (sore).   QUEtiapine 200 MG tablet Commonly known as: SEROQUEL Take 1 tablet (200 mg total) by mouth at bedtime.   QUEtiapine 100 MG tablet Commonly known as: SEROQUEL Take 1 tablet (100 mg total) by mouth every morning. Start taking on: August 13, 2020   traZODone 50 MG tablet Commonly known as: DESYREL Take 100 mg by mouth at bedtime.   Vitamin D (Ergocalciferol) 1.25 MG (50000 UNIT) Caps capsule Commonly known as: DRISDOL Take 50,000 Units by mouth every Thursday.       Follow-up Information    Denyce Robert, FNP Follow up in 2 week(s).   Specialty: Family Medicine Contact information: [place] [id number] [phone]              No Known Allergies  Consultations:  PCCM  Psychiatry   Procedures/Studies: DG Chest 2 View  Result Date: 07/25/2020 CLINICAL DATA:  60 year old male with history of acute cough and shortness of breath. Body aches. EXAM: CHEST - 2 VIEW COMPARISON:  Chest x-ray 03/11/2020. FINDINGS: Emphysematous changes are again noted throughout the lungs bilaterally. Chronic fibrocavitary changes in the right mid lung, with increasing cavitation compared to prior examinations, including probable small air-fluid level. Other patchy areas of widespread interstitial prominence and reticulonodular densities scattered throughout the lungs bilaterally, increased compared to the prior study. This is most evident in the left mid and lower lung, as well as in the right lung base. Chronic small right pleural effusion. No left pleural effusion. No pneumothorax. No evidence of pulmonary edema. Heart size is normal.  IMPRESSION: 1. Evidence of worsening chronic atypical infection, with increasing fibrocavitary change in the right mid lung with air-fluid levels on today's examination. Electronically Signed   By: Vinnie Langton M.D.   On: 07/25/2020 09:22   CT HEAD WO CONTRAST  Result Date: 08/02/2020 CLINICAL DATA:  Sepsis, altered mental status EXAM: CT HEAD WITHOUT CONTRAST TECHNIQUE: Contiguous axial images were obtained from the base of the skull through the vertex without intravenous contrast. COMPARISON:  None. FINDINGS: Brain: Normal anatomic configuration. Parenchymal volume loss is commensurate with the patient's age. Mild periventricular white matter changes are present likely reflecting the sequela of small vessel ischemia. No abnormal intra or extra-axial mass lesion or fluid collection. No abnormal mass effect or midline shift. No evidence of acute intracranial hemorrhage or infarct. Ventricular size is normal. Cerebellum unremarkable. Vascular: No asymmetric hyperdense vasculature at the skull base. Skull: Intact Sinuses/Orbits: Paranasal sinuses are clear. Orbits are unremarkable. Other: Mastoid air cells and middle ear cavities are clear. IMPRESSION: No acute intracranial abnormality.  Mild senescent change. Electronically Signed   By: Fidela Salisbury MD   On: 08/02/2020 07:10   CT Chest Wo Contrast  Result Date: 08/02/2020 CLINICAL DATA:  Recently diagnosed with lung infection, prescribed antibiotics without improvement, altered behavior, renal parenchymal disease hematuria EXAM: CT CHEST, ABDOMEN AND PELVIS WITHOUT CONTRAST  TECHNIQUE: Multidetector CT imaging of the chest, abdomen and pelvis was performed following the standard protocol without IV contrast. COMPARISON:  CT chest, abdomen and pelvis 03/12/2020, radiograph 08/01/2020 FINDINGS: CT CHEST FINDINGS Cardiovascular: Normal cardiac size. Calcifications upon the chordae tendinae. Coronary artery calcifications are present. Small pericardial  effusion, new from prior. Atherosclerotic plaque within the normal caliber aorta. No hyperdense mural thickening or plaque displacement. No periaortic stranding or hemorrhage. Normal 3 vessel branching of the aortic arch with minimal plaque in the proximal great vessels. Central arteries top-normal caliber. Luminal evaluation of the vasculature precluded in the absence of contrast media Mediastinum/Nodes: Redemonstrated mediastinal and hilar adenopathy. Index right paratracheal node measures 9 mm short axis, previously 12 mm. Additional paratracheal and subcarinal nodal nodes are similar to decreased in size. No acute abnormality of the esophagus. Trachea is patent. Thyroid gland and thoracic inlet are unremarkable. Lungs/Pleura: Extensive bronchiectatic changes with circumferential peribronchial opacity throughout the right hemithorax with more mild airways thickening left. Worsening cavitary changes in the right upper lobe now involving large portions of the right middle and lower lobe as well. Layering air-fluid levels are seen towards the right lung apex and within additional cavitary foci towards lung base. Interspersed within the aerated portions of the right lung and extensively throughout the left lung are additional centrilobular predominant areas of consolidation and ground-glass opacity some of which appear to be demonstrating developing central cavitation as well. This superimposed mixed consolidative, ground-glass and cavitary process is entirely new from comparison portable radiography corresponds to worsening airspace disease on the chest radiograph. No convincing left pneumothorax or pleural effusion. Musculoskeletal: Multilevel degenerative changes are present in the imaged portions of the spine. Multilevel flowing anterior osteophytosis, compatible with features of diffuse idiopathic skeletal hyperostosis (DISH). No acute or concerning osseous lesions. No clear involvement of the chest wall by the  presumed infectious process in the right hemithorax. Asymmetric expansion the left chest cavity is similar to prior. CT ABDOMEN PELVIS FINDINGS Hepatobiliary: No visible focal liver lesion within limitations of this unenhanced CT. Normal liver attenuation. Smooth liver surface contour. Normal gallbladder and biliary tree. Pancreas: No pancreatic ductal dilatation or surrounding inflammatory changes. Spleen: Normal in size. No concerning splenic lesions. Adrenals/Urinary Tract: Normal adrenal glands. No significant perinephric stranding inflammation accounting for motion artifact kidneys are symmetric in size and normally located. No visible or contour deforming renal lesion. No urolithiasis or hydronephrosis. Urinary bladder is grossly unremarkable aside from indentation of bladder base by an enlarged prostate. Stomach/Bowel: Distal esophagus, stomach and duodenum are unremarkable. Normal sweep across the midline abdomen. Air and fluid-filled loops of small bowel throughout the abdomen and pelvis without frank distention at this time, overall nonspecific in appearance but could reflect an enteritis. Colon is largely decompressed with minimal formed stool at the rectal vault. No colonic thickening or dilatation. Normal noninflamed appendix in the right lower quadrant. No evidence of bowel obstruction. Vascular/Lymphatic: Atherosclerotic calcifications within the abdominal aorta and branch vessels. No aneurysm or ectasia. No enlarged abdominopelvic lymph nodes. Reproductive: Borderline prostatomegaly with indentation of the bladder base. Dense calcification of the seminal vesicles, nonspecific, can be a senescent finding vascular calcifications of penis additional calcification noted towards the glans penis possibly related to the foreskin though motion artifact degrades imaging quality. Other: No abdominopelvic free air or fluid. No bowel containing hernia. Musculoskeletal: Multilevel degenerative changes are present  in the imaged portions of the spine. Straightening of normal lumbar lordosis. Additional degenerative changes in the hips and pelvis including  partial bony fusion across the left SI joint. No acute osseous abnormality or suspicious osseous lesion. IMPRESSION: 1. Worsening thick-walled cavitary changes throughout the right hemithorax now involving large portions of the right middle lower lobe as well. Layering air-fluid levels are seen towards the right lung apex and within additional cavitary foci towards the lung base. Additional new widespread areas of bilateral centrilobular predominant consolidation and ground-glass opacity throughout the aerated portions of both lungs, some of which appears to demonstrate early central cavitation as well. Findings are concerning for worsening multifocal necrotizing pulmonary infection. 2. Redemonstrated mediastinal and hilar adenopathy, nonspecific though much of which is similar to decreased in size from prior. 3. Small pericardial effusion. 4. Air and fluid-filled loops of small bowel throughout the abdomen and pelvis without frank distention at this time, overall nonspecific in appearance but could reflect an enteritis. 5. Borderline prostatomegaly with indentation of the bladder base. Correlate for clinical symptoms of outlet obstruction. 6. Aortic Atherosclerosis (ICD10-I70.0). Electronically Signed   By: Lovena Le M.D.   On: 08/02/2020 03:18   MR BRAIN WO CONTRAST  Result Date: 08/07/2020 CLINICAL DATA:  Initial evaluation for acute mental status change. EXAM: MRI HEAD WITHOUT CONTRAST TECHNIQUE: Multiplanar, multiecho pulse sequences of the brain and surrounding structures were obtained without intravenous contrast. COMPARISON:  Prior CT from 08/02/2020. FINDINGS: Brain: Examination severely limited due to extensive motion artifact. Additionally, patient was unable to tolerate the full length of the exam, with only diffusion weighted sequence, axial T2, sagittal  T1 weighted sequences performed. Generalized age-related cerebral atrophy. No definite foci of restricted diffusion to suggest acute or subacute ischemia. No visible areas of encephalomalacia to suggest chronic cortical infarction. No visible mass lesion, mass effect, or midline shift. Ventricles normal in size without hydrocephalus. No definite extra-axial fluid collection. Vascular: Major intracranial vascular flow voids are grossly maintained at the skull base. Skull and upper cervical spine: Craniocervical junction grossly within normal limits. Bone marrow signal intensity grossly normal. No visible scalp soft tissue abnormality. Sinuses/Orbits: Globes orbital soft tissues grossly within normal limits. Paranasal sinuses and mastoid air cells are grossly clear. Other: None. IMPRESSION: 1. Technically limited exam due to severe motion artifact and patient's inability to tolerate the full length of the study. 2. No definite acute intracranial abnormality identified. Electronically Signed   By: Jeannine Boga M.D.   On: 08/07/2020 00:07   DG Chest Portable 1 View  Result Date: 08/01/2020 CLINICAL DATA:  Altered mental status. EXAM: PORTABLE CHEST 1 VIEW COMPARISON:  Radiograph 07/24/2020.  CT 03/11/2020 FINDINGS: Chronic volume loss in the right hemithorax. Chronic bronchiectasis and scarring right upper lobe. Previous air-fluid level within right apical cavity is not as well visualized. There is increasing patchy opacities throughout the right hemithorax. Blunting of the right costophrenic angle may be due to pleural effusion or volume loss. There is worsening airspace disease in the left mid lower lung zone. No pneumothorax. Left costophrenic angle excluded from the field of view, patient had difficulty tolerating the exam IMPRESSION: 1. Worsening airspace disease throughout the right hemithorax and left mid and lower lung zone, suspicious for pneumonia, including atypical infection. 2. Chronic volume  loss in the right hemithorax with bronchiectasis and scarring right upper lobe. Previous air-fluid level within the right apical cavity is not as well visualized. Electronically Signed   By: Keith Rake M.D.   On: 08/01/2020 23:59   CT Renal Stone Study  Result Date: 08/02/2020 CLINICAL DATA:  Recently diagnosed with lung infection, prescribed  antibiotics without improvement, altered behavior, renal parenchymal disease hematuria EXAM: CT CHEST, ABDOMEN AND PELVIS WITHOUT CONTRAST TECHNIQUE: Multidetector CT imaging of the chest, abdomen and pelvis was performed following the standard protocol without IV contrast. COMPARISON:  CT chest, abdomen and pelvis 03/12/2020, radiograph 08/01/2020 FINDINGS: CT CHEST FINDINGS Cardiovascular: Normal cardiac size. Calcifications upon the chordae tendinae. Coronary artery calcifications are present. Small pericardial effusion, new from prior. Atherosclerotic plaque within the normal caliber aorta. No hyperdense mural thickening or plaque displacement. No periaortic stranding or hemorrhage. Normal 3 vessel branching of the aortic arch with minimal plaque in the proximal great vessels. Central arteries top-normal caliber. Luminal evaluation of the vasculature precluded in the absence of contrast media Mediastinum/Nodes: Redemonstrated mediastinal and hilar adenopathy. Index right paratracheal node measures 9 mm short axis, previously 12 mm. Additional paratracheal and subcarinal nodal nodes are similar to decreased in size. No acute abnormality of the esophagus. Trachea is patent. Thyroid gland and thoracic inlet are unremarkable. Lungs/Pleura: Extensive bronchiectatic changes with circumferential peribronchial opacity throughout the right hemithorax with more mild airways thickening left. Worsening cavitary changes in the right upper lobe now involving large portions of the right middle and lower lobe as well. Layering air-fluid levels are seen towards the right lung apex  and within additional cavitary foci towards lung base. Interspersed within the aerated portions of the right lung and extensively throughout the left lung are additional centrilobular predominant areas of consolidation and ground-glass opacity some of which appear to be demonstrating developing central cavitation as well. This superimposed mixed consolidative, ground-glass and cavitary process is entirely new from comparison portable radiography corresponds to worsening airspace disease on the chest radiograph. No convincing left pneumothorax or pleural effusion. Musculoskeletal: Multilevel degenerative changes are present in the imaged portions of the spine. Multilevel flowing anterior osteophytosis, compatible with features of diffuse idiopathic skeletal hyperostosis (DISH). No acute or concerning osseous lesions. No clear involvement of the chest wall by the presumed infectious process in the right hemithorax. Asymmetric expansion the left chest cavity is similar to prior. CT ABDOMEN PELVIS FINDINGS Hepatobiliary: No visible focal liver lesion within limitations of this unenhanced CT. Normal liver attenuation. Smooth liver surface contour. Normal gallbladder and biliary tree. Pancreas: No pancreatic ductal dilatation or surrounding inflammatory changes. Spleen: Normal in size. No concerning splenic lesions. Adrenals/Urinary Tract: Normal adrenal glands. No significant perinephric stranding inflammation accounting for motion artifact kidneys are symmetric in size and normally located. No visible or contour deforming renal lesion. No urolithiasis or hydronephrosis. Urinary bladder is grossly unremarkable aside from indentation of bladder base by an enlarged prostate. Stomach/Bowel: Distal esophagus, stomach and duodenum are unremarkable. Normal sweep across the midline abdomen. Air and fluid-filled loops of small bowel throughout the abdomen and pelvis without frank distention at this time, overall nonspecific in  appearance but could reflect an enteritis. Colon is largely decompressed with minimal formed stool at the rectal vault. No colonic thickening or dilatation. Normal noninflamed appendix in the right lower quadrant. No evidence of bowel obstruction. Vascular/Lymphatic: Atherosclerotic calcifications within the abdominal aorta and branch vessels. No aneurysm or ectasia. No enlarged abdominopelvic lymph nodes. Reproductive: Borderline prostatomegaly with indentation of the bladder base. Dense calcification of the seminal vesicles, nonspecific, can be a senescent finding vascular calcifications of penis additional calcification noted towards the glans penis possibly related to the foreskin though motion artifact degrades imaging quality. Other: No abdominopelvic free air or fluid. No bowel containing hernia. Musculoskeletal: Multilevel degenerative changes are present in the imaged portions of  the spine. Straightening of normal lumbar lordosis. Additional degenerative changes in the hips and pelvis including partial bony fusion across the left SI joint. No acute osseous abnormality or suspicious osseous lesion. IMPRESSION: 1. Worsening thick-walled cavitary changes throughout the right hemithorax now involving large portions of the right middle lower lobe as well. Layering air-fluid levels are seen towards the right lung apex and within additional cavitary foci towards the lung base. Additional new widespread areas of bilateral centrilobular predominant consolidation and ground-glass opacity throughout the aerated portions of both lungs, some of which appears to demonstrate early central cavitation as well. Findings are concerning for worsening multifocal necrotizing pulmonary infection. 2. Redemonstrated mediastinal and hilar adenopathy, nonspecific though much of which is similar to decreased in size from prior. 3. Small pericardial effusion. 4. Air and fluid-filled loops of small bowel throughout the abdomen and  pelvis without frank distention at this time, overall nonspecific in appearance but could reflect an enteritis. 5. Borderline prostatomegaly with indentation of the bladder base. Correlate for clinical symptoms of outlet obstruction. 6. Aortic Atherosclerosis (ICD10-I70.0). Electronically Signed   By: Lovena Le M.D.   On: 08/02/2020 03:18   (Echo, Carotid, EGD, Colonoscopy, ERCP)    Subjective: Patient seen and examined.  No overnight events.  Remains on room air.  He walked around.  He is looking forward to go home.  Denies any chest pain or shortness of breath.   Discharge Exam: Vitals:   08/11/20 2209 08/12/20 0320  BP: 138/81 (!) 101/57  Pulse: 87 91  Resp: 20 14  Temp: 98 F (36.7 C) 97.7 F (36.5 C)  SpO2: 95% 91%   Vitals:   08/11/20 0355 08/11/20 1238 08/11/20 2209 08/12/20 0320  BP: 119/66 (!) 104/55 138/81 (!) 101/57  Pulse: 89 84 87 91  Resp: 16 18 20 14   Temp: 97.7 F (36.5 C) 97.8 F (36.6 C) 98 F (36.7 C) 97.7 F (36.5 C)  TempSrc: Oral Oral  Oral  SpO2: 100% 100% 95% 91%  Weight:      Height:        General: Pt is alert, awake, not in acute distress He is on room air.  Frail and debilitated.  Looks chronically sick but not in distress. Patient is alert and oriented x1-2.  He is quiet and composed.  He has some cognitive delay, still not oriented to place.  He is oriented to situation and person. Cardiovascular: RRR, S1/S2 +, no rubs, no gallops Respiratory: CTA bilaterally, no wheezing, no rhonchi Abdominal: Soft, NT, ND, bowel sounds + Extremities: no edema, no cyanosis    The results of significant diagnostics from this hospitalization (including imaging, microbiology, ancillary and laboratory) are listed below for reference.     Microbiology: Recent Results (from the past 240 hour(s))  MRSA PCR Screening     Status: None   Collection Time: 08/02/20 11:07 AM   Specimen: Nasopharyngeal  Result Value Ref Range Status   MRSA by PCR NEGATIVE  NEGATIVE Final    Comment:        The GeneXpert MRSA Assay (FDA approved for NASAL specimens only), is one component of a comprehensive MRSA colonization surveillance program. It is not intended to diagnose MRSA infection nor to guide or monitor treatment for MRSA infections. Performed at [facility], [place] [address], [place], [place] [address]   Culture, Respiratory w Gram Stain     Status: None   Collection Time: 08/04/20 12:56 PM   Specimen: Tracheal Aspirate  Result Value Ref Range Status   Specimen Description TRACHEAL ASPIRATE  Final   Special Requests NONE  Final   Gram Stain   Final    FEW WBC PRESENT, PREDOMINANTLY PMN RARE GRAM POSITIVE COCCI IN PAIRS    Culture   Final    RARE Normal respiratory flora-no Staph aureus or Pseudomonas seen Performed at [facility], [address], [place], [place] [address]    Report Status 08/06/2020 FINAL  Final     Labs: BNP (last 3 results) No results for input(s): BNP in the last 8760 hours. Basic Metabolic Panel: Recent Labs  Lab 08/06/20 0205 08/09/20 0257  NA 142 134*  K 3.5 3.5  CL 109 101  CO2 26 25  GLUCOSE 85 76  BUN 11 8  CREATININE 0.80 0.87  CALCIUM 8.5* 8.0*  MG 1.6* 1.7  PHOS 2.6 2.8   Liver Function Tests: Recent Labs  Lab 08/09/20 0257  AST 15  ALT 7  ALKPHOS 43  BILITOT 1.1  PROT 6.2*  ALBUMIN 1.4*   No results for input(s): LIPASE, AMYLASE in the last 168 hours. No results for input(s): AMMONIA in the last 168 hours. CBC: Recent Labs  Lab 08/06/20 0205 08/09/20 0257  WBC 8.7 8.3  NEUTROABS 6.4 6.5  HGB 10.1* 9.9*  HCT 31.8* 31.2*  MCV 90.1 89.9  PLT 348 292   Cardiac Enzymes: No results for input(s): CKTOTAL, CKMB, CKMBINDEX, TROPONINI in the last 168 hours. BNP: Invalid input(s): POCBNP CBG: Recent Labs  Lab 08/05/20 1206 08/05/20 1635 08/05/20 2109  GLUCAP 122* 95 73   D-Dimer No results for input(s): DDIMER in the last 72 hours. Hgb A1c No results for  input(s): HGBA1C in the last 72 hours. Lipid Profile No results for input(s): CHOL, HDL, LDLCALC, TRIG, CHOLHDL, LDLDIRECT in the last 72 hours. Thyroid function studies No results for input(s): TSH, T4TOTAL, T3FREE, THYROIDAB in the last 72 hours.  Invalid input(s): FREET3 Anemia work up No results for input(s): VITAMINB12, FOLATE, FERRITIN, TIBC, IRON, RETICCTPCT in the last 72 hours. Urinalysis    Component Value Date/Time   COLORURINE YELLOW 08/02/2020 0422   APPEARANCEUR HAZY (A) 08/02/2020 0422   LABSPEC 1.014 08/02/2020 0422   PHURINE 5.0 08/02/2020 0422   GLUCOSEU NEGATIVE 08/02/2020 0422   HGBUR NEGATIVE 08/02/2020 0422   BILIRUBINUR NEGATIVE 08/02/2020 0422   KETONESUR 5 (A) 08/02/2020 0422   PROTEINUR NEGATIVE 08/02/2020 0422   NITRITE NEGATIVE 08/02/2020 0422   LEUKOCYTESUR NEGATIVE 08/02/2020 0422   Sepsis Labs Invalid input(s): PROCALCITONIN,  WBC,  LACTICIDVEN Microbiology Recent Results (from the past 240 hour(s))  MRSA PCR Screening     Status: None   Collection Time: 08/02/20 11:07 AM   Specimen: Nasopharyngeal  Result Value Ref Range Status   MRSA by PCR NEGATIVE NEGATIVE Final    Comment:        The GeneXpert MRSA Assay (FDA approved for NASAL specimens only), is one component of a comprehensive MRSA colonization surveillance program. It is not intended to diagnose MRSA infection nor to guide or monitor treatment for MRSA infections. Performed at [facility], [place] [address] [place], [place] [address]   Culture, Respiratory w Gram Stain     Status: None   Collection Time: 08/04/20 12:56 PM   Specimen: Tracheal Aspirate  Result Value Ref Range Status   Specimen Description TRACHEAL ASPIRATE  Final   Special Requests NONE  Final   Gram Stain   Final  FEW WBC PRESENT, PREDOMINANTLY PMN RARE GRAM POSITIVE COCCI IN PAIRS    Culture   Final    RARE Normal respiratory flora-no Staph aureus or Pseudomonas seen Performed at [place] [address] [place],  [address]    Report Status 08/06/2020 FINAL  Final     Time coordinating discharge:  35 minutes  SIGNED:   Barb Merino, MD  [facility] 08/12/2020, 10:45 AM

## 2020-08-13 LAB — VITAMIN B1: Vitamin B1 (Thiamine): 339.9 nmol/L — ABNORMAL HIGH (ref 66.5–200.0)

## 2020-08-21 ENCOUNTER — Encounter (HOSPITAL_COMMUNITY): Payer: Self-pay | Admitting: Emergency Medicine

## 2020-08-21 ENCOUNTER — Emergency Department (HOSPITAL_COMMUNITY): Payer: Medicaid Other

## 2020-08-21 ENCOUNTER — Other Ambulatory Visit: Payer: Self-pay

## 2020-08-21 ENCOUNTER — Emergency Department (HOSPITAL_COMMUNITY)
Admission: EM | Admit: 2020-08-21 | Discharge: 2020-08-21 | Discharge status: Against Medical Advice | Payer: Medicaid Other | Attending: Emergency Medicine | Admitting: Emergency Medicine

## 2020-08-21 DIAGNOSIS — R630 Anorexia: Secondary | ICD-10-CM | POA: Insufficient documentation

## 2020-08-21 DIAGNOSIS — F17210 Nicotine dependence, cigarettes, uncomplicated: Secondary | ICD-10-CM | POA: Diagnosis not present

## 2020-08-21 DIAGNOSIS — I959 Hypotension, unspecified: Secondary | ICD-10-CM

## 2020-08-21 DIAGNOSIS — R000 Tachycardia, unspecified: Secondary | ICD-10-CM | POA: Insufficient documentation

## 2020-08-21 DIAGNOSIS — E860 Dehydration: Secondary | ICD-10-CM

## 2020-08-21 DIAGNOSIS — J189 Pneumonia, unspecified organism: Secondary | ICD-10-CM

## 2020-08-21 DIAGNOSIS — J188 Other pneumonia, unspecified organism: Secondary | ICD-10-CM | POA: Diagnosis not present

## 2020-08-21 DIAGNOSIS — R42 Dizziness and giddiness: Secondary | ICD-10-CM | POA: Diagnosis present

## 2020-08-21 LAB — CBC WITH DIFFERENTIAL/PLATELET
Abs Immature Granulocytes: 0.02 10*3/uL (ref 0.00–0.07)
Basophils Absolute: 0.2 10*3/uL — ABNORMAL HIGH (ref 0.0–0.1)
Basophils Relative: 2 %
Eosinophils Absolute: 0.8 10*3/uL — ABNORMAL HIGH (ref 0.0–0.5)
Eosinophils Relative: 11 %
HCT: 32.2 % — ABNORMAL LOW (ref 39.0–52.0)
Hemoglobin: 9.8 g/dL — ABNORMAL LOW (ref 13.0–17.0)
Immature Granulocytes: 0 %
Lymphocytes Relative: 18 %
Lymphs Abs: 1.3 10*3/uL (ref 0.7–4.0)
MCH: 28.3 pg (ref 26.0–34.0)
MCHC: 30.4 g/dL (ref 30.0–36.0)
MCV: 93.1 fL (ref 80.0–100.0)
Monocytes Absolute: 0.6 10*3/uL (ref 0.1–1.0)
Monocytes Relative: 8 %
Neutro Abs: 4.4 10*3/uL (ref 1.7–7.7)
Neutrophils Relative %: 61 %
Platelets: 405 10*3/uL — ABNORMAL HIGH (ref 150–400)
RBC: 3.46 MIL/uL — ABNORMAL LOW (ref 4.22–5.81)
RDW: 14.6 % (ref 11.5–15.5)
WBC: 7.1 10*3/uL (ref 4.0–10.5)
nRBC: 0 % (ref 0.0–0.2)

## 2020-08-21 LAB — COMPREHENSIVE METABOLIC PANEL
ALT: 9 U/L (ref 0–44)
AST: 11 U/L — ABNORMAL LOW (ref 15–41)
Albumin: 1.7 g/dL — ABNORMAL LOW (ref 3.5–5.0)
Alkaline Phosphatase: 70 U/L (ref 38–126)
Anion gap: 7 (ref 5–15)
BUN: 17 mg/dL (ref 6–20)
CO2: 25 mmol/L (ref 22–32)
Calcium: 8.1 mg/dL — ABNORMAL LOW (ref 8.9–10.3)
Chloride: 102 mmol/L (ref 98–111)
Creatinine, Ser: 1.06 mg/dL (ref 0.61–1.24)
GFR, Estimated: >60 mL/min (ref >60)
Glucose, Bld: 111 mg/dL — ABNORMAL HIGH (ref 70–99)
Potassium: 4.8 mmol/L (ref 3.5–5.1)
Sodium: 134 mmol/L — ABNORMAL LOW (ref 135–145)
Total Bilirubin: 0.5 mg/dL (ref 0.3–1.2)
Total Protein: 6.8 g/dL (ref 6.5–8.1)

## 2020-08-21 LAB — LACTIC ACID, PLASMA
Lactic Acid, Venous: 2 mmol/L (ref 0.5–1.9)
Lactic Acid, Venous: 1.9 mmol/L (ref 0.5–1.9)

## 2020-08-21 LAB — TYPE AND SCREEN
ABO/RH(D): A POS
Antibody Screen: NEGATIVE

## 2020-08-21 LAB — APTT: aPTT: 29 seconds (ref 24–36)

## 2020-08-21 LAB — PROTIME-INR
INR: 1.2 (ref 0.8–1.2)
Prothrombin Time: 15.2 seconds (ref 11.4–15.2)

## 2020-08-21 MED ORDER — SODIUM CHLORIDE 0.9 % IV BOLUS
2000.0000 mL | Freq: Once | INTRAVENOUS | Status: AC
  Administered 2020-08-21: 2000 mL via INTRAVENOUS

## 2020-08-21 NOTE — ED Notes (Signed)
Pt has been threatening to leave since admission to ED.  PA informed.  Pt is allowing lab the to draw labs.

## 2020-08-21 NOTE — ED Provider Notes (Signed)
  Face-to-face evaluation   History: Patient presents for sensation of dizziness with standing, and thinks he is dehydrated.  He went to see his PCP today for checkup Here because he was hypotensive.  He is anxious to leave and go home and states "I want to stay here in the hospital."  He was recently treated for necrotizing pneumonia and states he is taking Augmentin as directed.  He denies fever, chills, cough, shortness of breath, focal weakness or paresthesia.  Physical exam: Disheveled and malnourished appearing.  No respiratory distress.  No dysarthria or aphasia.  He appears lucid.  Medical screening examination/treatment/procedure(s) were conducted as a shared visit with non-physician practitioner(s) and myself.  I personally evaluated the patient during the encounter   Daleen Bo, MD 08/21/20 1807

## 2020-08-21 NOTE — ED Provider Notes (Signed)
[facility] EMERGENCY DEPARTMENT Provider Note   CSN: [id number] Arrival date & time: 08/21/20  1033     History Chief Complaint  Patient presents with  . Dizziness    Derrick Beltran is a 60 y.o. male with past medical history significant for recent discharge for metabolic encephalopathy, sepsis due to multifocal cavitary pneumonia, empyema who presents for evaluation of hypotension.  Patient discharged 1 week ago.  Patient states he went to primary care provider today and noted to have low blood pressure and sent him here to the emergency department.  Patient states he has some lightheaded nests and dizziness when he goes from sitting to standing.  Patient states he does not have an appetite.  He has not been eating and drinking at home.  States he is having "normal" bowel movements.  Last bowel movement he thinks was yesterday.  He denies any melena or blood per rectum.  He denies any emesis.  States he does have a cough which is nonproductive.  States he is compliant with his medications, specifically his antibiotics but she was discharged on however does not know the name of this.  He denies headache, fever, chills, chest pain, shortness of breath abdominal pain, diarrhea, dysuria, paresthesias.  He does feel generally weak.  States he did have a nosebleed yesterday.  None currently.  States he feels like his mouth is "sticky and dry."  Denies additional aggravating or alleviating factors.  History obtained from patient and past medical records.  No interpreter used.  HPI     Past Medical History:  Diagnosis Date  . Empyema ([place]) 03/03/2018; 03/14/2018  . GERD (gastroesophageal reflux disease)     Patient Active Problem List   Diagnosis Date Noted  . Multifocal pneumonia 08/02/2020  . Unintentional weight loss 08/02/2020  . Sepsis ([place]) 08/02/2020  . Acute metabolic encephalopathy 50/27/7412  . AKI (acute kidney injury) ([place]) 08/02/2020  . Leukocytosis 08/02/2020  . Necrotizing  pneumonia ([place]) 05/02/2018  . Malnutrition of moderate degree 03/17/2018  . Hyponatremia 03/04/2018  . Pulmonary nodule 03/04/2018  . Aortic atherosclerosis ([place]) 03/04/2018  . Tobacco use 03/04/2018  . Pleural empyema ([place]) 03/03/2018    Past Surgical History:  Procedure Laterality Date  . NO PAST SURGERIES         Family History  Problem Relation Age of Onset  . Alzheimer's disease Mother   . Alzheimer's disease Father     Social History   Tobacco Use  . Smoking status: Current Every Day Smoker    Packs/day: 0.50    Years: 45.00    Pack years: 22.50    Types: Cigarettes  . Smokeless tobacco: Never Used  Vaping Use  . Vaping Use: Never used  Substance Use Topics  . Alcohol use: Yes    Comment: 03/03/2018 "3-4 / day and more on weekends "' 05/22/20 avg 1-2 daily  . Drug use: Never    Home Medications Prior to Admission medications   Medication Sig Start Date End Date Taking? Authorizing Provider  albuterol (VENTOLIN HFA) 108 (90 Base) MCG/ACT inhaler Inhale 2 puffs into the lungs every 4 (four) hours as needed for wheezing or shortness of breath. 06/19/20  Yes [provider]  Hydrocortisone, Perianal, 1 % CREA Apply 1 application topically daily as needed (sore). 02/21/20  Yes [provider]  QUEtiapine (SEROQUEL) 100 MG tablet Take 1 tablet (100 mg total) by mouth every morning. 08/13/20 09/12/20 Yes Ghimire, Dante Gang, MD  QUEtiapine (SEROQUEL) 200 MG tablet Take  1 tablet (200 mg total) by mouth at bedtime. 08/12/20 09/11/20 Yes Barb Merino, MD  traZODone (DESYREL) 50 MG tablet Take 100 mg by mouth at bedtime. 02/21/20  Yes [provider]  Vitamin D, Ergocalciferol, (DRISDOL) 1.25 MG (50000 UNIT) CAPS capsule Take 50,000 Units by mouth every Thursday. 07/25/20  Yes [provider]  amoxicillin-clavulanate (AUGMENTIN) 875-125 MG tablet Take 1 tablet by mouth every 12 (twelve) hours for 19 days. Patient not taking: Reported on 08/21/2020  08/12/20 08/31/20  Barb Merino, MD  guaiFENesin-codeine 100-10 MG/5ML syrup Take 5 mLs by mouth 2 (two) times daily as needed for cough. Patient not taking: Reported on 08/21/2020 07/24/20   [provider]    Allergies    Patient has no known allergies.  Review of Systems   Review of Systems  Constitutional: Positive for activity change, appetite change and fatigue.  HENT: Negative.   Respiratory: Positive for cough. Negative for choking, chest tightness and shortness of breath.   Cardiovascular: Negative.   Gastrointestinal: Negative.   Genitourinary: Negative.   Musculoskeletal: Negative.   Skin: Negative.   Neurological: Positive for dizziness (positional), weakness (Generalized) and light-headedness. Negative for tremors, seizures, syncope, facial asymmetry, speech difficulty, numbness and headaches.  All other systems reviewed and are negative.   Physical Exam Updated Vital Signs BP 107/70   Pulse 64   Temp 97.8 F (36.6 C) (Oral)   Resp 17   Ht 5\' 9"  (1.753 m)   Wt 54 kg   SpO2 100%   BMI 17.57 kg/m   Physical Exam Vitals and nursing note reviewed.  Constitutional:      General: He is not in acute distress.    Appearance: He is well-developed. He is ill-appearing (Chronically ill appearing).  HENT:     Head: Normocephalic and atraumatic.     Nose: Nose normal.     Comments: Dried blood to bilateral nares.  No septal hematoma.    Mouth/Throat:     Mouth: Mucous membranes are dry.     Pharynx: Oropharynx is clear.     Comments: Posterior oropharynx clear.  Dry tongue and mucous membranes.  Tongue midline. Eyes:     Pupils: Pupils are equal, round, and reactive to light.  Neck:     Comments: Full range of motion without difficulty Cardiovascular:     Rate and Rhythm: Regular rhythm. Tachycardia present.     Pulses: Normal pulses.     Heart sounds: Normal heart sounds.  Pulmonary:     Effort: Pulmonary effort is normal. No respiratory distress.      Breath sounds: Normal breath sounds.     Comments: Minimal rhonchi at bases.  Clears with cough.  Speaks in full sentences without difficulty. Abdominal:     General: Bowel sounds are normal. There is no distension.     Palpations: Abdomen is soft. There is no mass.     Tenderness: There is no abdominal tenderness. There is no right CVA tenderness, left CVA tenderness or guarding.     Comments: Soft, nontender without rebound or guarding.  Musculoskeletal:        General: Normal range of motion.     Cervical back: Normal range of motion and neck supple.     Comments: No bony tenderness.  Moves all 4 extremities without difficulty.  Skin:    General: Skin is warm and dry.     Capillary Refill: Capillary refill takes 2 to 3 seconds.     Comments: Appears pale.  No rashes or lesions.  Neurological:     General: No focal deficit present.     Mental Status: He is alert and oriented to person, place, and time.     Comments: Cranial nerves II 12 grossly intact Intact sensation Equal handgrip bilaterally Clear speech     ED Results / Procedures / Treatments   Labs (all labs ordered are listed, but only abnormal results are displayed) Labs Reviewed  LACTIC ACID, PLASMA - Abnormal; Notable for the following components:      Result Value   Lactic Acid, Venous 2.0 (*)    All other components within normal limits  COMPREHENSIVE METABOLIC PANEL - Abnormal; Notable for the following components:   Sodium 134 (*)    Glucose, Bld 111 (*)    Calcium 8.1 (*)    Albumin 1.7 (*)    AST 11 (*)    All other components within normal limits  CBC WITH DIFFERENTIAL/PLATELET - Abnormal; Notable for the following components:   RBC 3.46 (*)    Hemoglobin 9.8 (*)    HCT 32.2 (*)    Platelets 405 (*)    Eosinophils Absolute 0.8 (*)    Basophils Absolute 0.2 (*)    All other components within normal limits  CULTURE, BLOOD (SINGLE)  URINE CULTURE  LACTIC ACID, PLASMA  PROTIME-INR  APTT  URINALYSIS,  ROUTINE W REFLEX MICROSCOPIC  TYPE AND SCREEN    EKG EKG Interpretation  Date/Time:  Wednesday August 21 2020 11:41:56 EDT Ventricular Rate:  69 PR Interval:  124 QRS Duration: 94 QT Interval:  405 QTC Calculation: 434 R Axis:   73 Text Interpretation: Sinus rhythm Since last tracing rate slower Otherwise no significant change Confirmed by Daleen Bo [phone]) on 08/21/2020 12:19:25 PM   Radiology DG Chest Port 1 View  Result Date: 08/21/2020 CLINICAL DATA:  Sepsis EXAM: PORTABLE CHEST 1 VIEW COMPARISON:  08/01/2020, 08/02/2020 FINDINGS: Stable cardiomediastinal contours. Chronic cavitary changes within the right upper lobe with a small amount of layering fluid. Improved aeration of the right lower lobe compared to prior with residual scarring/fibrosis. Persistent small right pleural effusion. Patchy opacities within the left mid and lower lung fields with slight improvement of aeration compared to prior. No left-sided pleural effusion is seen. No pneumothorax. IMPRESSION: 1. Improved aeration of the right lower lobe compared to prior. 2. Patchy opacities within the left mid and lower lung fields with slight improvement of aeration. Electronically Signed   By: Davina Poke D.O.   On: 08/21/2020 12:20    Procedures .Critical Care Performed by: Nettie Elm, PA-C Authorized by: Nettie Elm, PA-C   Critical care provider statement:    Critical care time (minutes):  45   Critical care was necessary to treat or prevent imminent or life-threatening deterioration of the following conditions:  Circulatory failure and dehydration   Critical care was time spent personally by me on the following activities:  Discussions with consultants, evaluation of patient's response to treatment, examination of patient, ordering and performing treatments and interventions, ordering and review of laboratory studies, ordering and review of radiographic studies, pulse oximetry, re-evaluation of  patient's condition, obtaining history from patient or surrogate and review of old charts     Medications Ordered in ED Medications  sodium chloride 0.9 % bolus 2,000 mL (0 mLs Intravenous Stopped 08/21/20 1416)   ED Course  I have reviewed the triage vital signs and the nursing notes.  Pertinent labs & imaging results that were available during  my care of the patient were reviewed by me and considered in my medical decision making (see chart for details).  60 year old here for evaluation of headache and dizziness.  On arrival he is afebrile, nonseptic appearing however does appear chronically ill.  He is hypotensive and mildly tachycardic.  Thorough chart review reveals recently discharged 1 week ago for multifocal cavitary pneumonia with associated metabolic encephalopathy, and sepsis.  He states he has been compliant with his p.o. antibiotics.  Has some very minimal rhonchi at bases, clears with cough.  Abdomen soft, nontender.  He does admit to very minimal p.o. intake since discharge as "my appetite is gone."  Hypotension likely multifactorial given appears very dehydrated.  Given afebrile, Will hold on code sepsis however will obtain sepsis labs. Normal mentation currently. Will start IVF.  Called to bedside by nursing. Patient reusing labs, IVF. Discussed importance with patient. He is agreeable for Labs, IVF however is adamant about no IV abx. States "I take the mouth ones at home. I dont need any here."  Labs and imaging personally reviewed and interpreted:  CBC without leukocytosis, Hemoglobin 9.8 at baseline DG chest with improved infiltrates from prior imaging Lactic 6.2>>0.3 INR 1.2 Metabolic panel sodium 559, glucose 111, calcium 8.1, noticed additional electrolyte, renal or liver abnormality UA>> Pt refusing to give urinalysis   Called to bedside by nursing.  Patient refusing additional care.  Discussed with patient low blood pressures, pending additional labs.  Discussed  with patient if he was to leave at this point he will be leaving [place].  Patient states "I wait for a little bit and then decide." Lab at bedside collected repeat lactic.  Patient has a nonfocal neuro exam without deficits have low suspicion for acute intracranial, central cause of his dizziness.  Likely due to his hypotension.  No chest pain, shortness of breath, back, abd pain>>low suspicion for acute ACS, PE, dissection, AAA.  Orthostatic Lying  BP- Lying:106/67 Pulse- Lying:66 (Sats 100% on RA) Orthostatic Sitting BP- Sitting:87/64 (!) Pulse- Sitting:65 (Sats 100% on RA. Denies dizziness. ) Orthostatic Standing at 0 minutes BP- Standing at 0 minutes:89/57 (!) Pulse- Standing at 0 minutes:74 (Denies dizziness. Sats 95% on RA. ) Orthostatic Standing at 3 minutes BP- Standing at 3 minutes:73/64 (!) Pulse- Standing at 3 minutes:97 (Denies dizziness. )  Patient still has significant hypotension despite multiple rounds of IV fluids. When sitting BP in 74'B systolic.  Likely multifactorial in nature given known infection, not eating or drinking at home.  Patient's wife at bedside.  Patient has normal mentation per wife in room.  Has capacity make decisions.  All questions answered with patient and family in room.  Recommended admission for continued hypotension.  Patient is adamant that he is leaving the ED.  I discussed risk versus benefit of leaving [place].  Patient and wife voiced understanding.    Patient will be leaving AGAINST MEDICAL ADVICE.  Encourage continuing p.o. antibiotics which he has at home, encouraged push fluids, return if agreeable for admission or worsening symptoms  We discussed the nature and purpose, risks and benefits, as well as, the alternatives of treatment. Time was given to allow the opportunity to ask questions and consider their options, and after the discussion, the patient decided to refuse the offerred treatment. The patient was  informed that refusal could lead to, but was not limited to, death, permanent disability, or severe pain. If present, I asked the relatives or significant others to dissuade them without  success. Prior to refusing, I determined that the patient had the capacity to make their decision and understood the consequences of that decision. After refusal, I made every reasonable opportunity to treat them to the best of my ability.  The patient was notified that they may return to the emergency department at any time for further treatment.       MDM Rules/Calculators/A&P                           Final Clinical Impression(s) / ED Diagnoses Final diagnoses:  Hypotension, unspecified hypotension type  Multifocal pneumonia  Dehydration    Rx / DC Orders ED Discharge Orders    None       Song Garris A, PA-C 08/21/20 1444    Daleen Bo, MD 08/21/20 [phone]

## 2020-08-21 NOTE — Discharge Instructions (Signed)
Continue taking antibiotics at home  Push fluids  Return for any worsening symptoms

## 2020-08-21 NOTE — ED Notes (Signed)
Pt refusing gown and IV at this time.

## 2020-08-21 NOTE — ED Triage Notes (Signed)
Pt to the ED with dizziness x 2 days. Pt states he has not been eating and drinking like normal.  Denies N/V/D/F

## 2020-08-21 NOTE — ED Notes (Signed)
Not able to obtain urine.  Pt is leaving AMA.. Pt's wife is asking pt to stay but pt is wanting to leave.

## 2020-08-26 LAB — CULTURE, BLOOD (SINGLE)
Culture: NO GROWTH
Special Requests: ADEQUATE

## 2020-09-19 ENCOUNTER — Inpatient Hospital Stay: Payer: Self-pay | Admitting: Pulmonary Disease

## 2020-10-09 ENCOUNTER — Inpatient Hospital Stay: Payer: Self-pay | Admitting: Pulmonary Disease

## 2020-11-04 ENCOUNTER — Ambulatory Visit: Payer: Self-pay | Admitting: Neurology

## 2020-11-14 ENCOUNTER — Ambulatory Visit: Payer: Self-pay | Admitting: Neurology

## 2021-02-10 ENCOUNTER — Ambulatory Visit: Payer: Self-pay | Admitting: Neurology

## 2021-07-16 ENCOUNTER — Other Ambulatory Visit (HOSPITAL_COMMUNITY): Payer: Self-pay | Admitting: Adult Health

## 2021-07-16 ENCOUNTER — Other Ambulatory Visit: Payer: Self-pay

## 2021-07-16 ENCOUNTER — Ambulatory Visit (HOSPITAL_COMMUNITY)
Admission: RE | Admit: 2021-07-16 | Discharge: 2021-07-16 | Disposition: A | Discharge status: Home / Self Care | Payer: Medicaid Other | Source: Ambulatory Visit | Attending: Adult Health | Admitting: Adult Health

## 2021-07-16 DIAGNOSIS — M25511 Pain in right shoulder: Secondary | ICD-10-CM | POA: Diagnosis not present

## 2022-04-23 ENCOUNTER — Other Ambulatory Visit (HOSPITAL_COMMUNITY): Payer: Self-pay

## 2023-04-19 ENCOUNTER — Other Ambulatory Visit: Payer: Self-pay

## 2023-04-19 ENCOUNTER — Emergency Department (HOSPITAL_COMMUNITY)
Admission: EM | Admit: 2023-04-19 | Discharge: 2023-04-19 | Disposition: A | Discharge status: Home / Self Care | Payer: Medicare Other | Attending: Emergency Medicine | Admitting: Emergency Medicine

## 2023-04-19 ENCOUNTER — Encounter (HOSPITAL_COMMUNITY): Payer: Self-pay

## 2023-04-19 DIAGNOSIS — K6289 Other specified diseases of anus and rectum: Secondary | ICD-10-CM | POA: Diagnosis present

## 2023-04-19 NOTE — Discharge Instructions (Addendum)
It was a pleasure taking care of you this afternoon.  You were evaluated in the emergency room for rectal pain.  A rectal exam was performed which did not show any obvious external hemorrhoids.  You are being provided a referral to see gastroenterology, however this may take months to get an appointment so in the meantime please follow-up with your primary care doctor.  As discussed please eat a high fiber diet and consume lots of fluids.  If you experience any new or worsening symptoms including fevers or chills please return to the emergency room department.

## 2023-04-19 NOTE — ED Provider Notes (Signed)
[place] EMERGENCY DEPARTMENT AT [facility] Provider Note   CSN: [id number] Arrival date & time: 04/19/23  1256     History  Chief Complaint  Patient presents with   Rectal Pain    Derrick Beltran is a 62 y.o. male with past medical history of hemorrhoids presents with rectal pain.  He states he was evaluated by his PCP who prescribed him Preparation H and hydrocortisone cortisone suppositories.  He has tried these for the past 6 days without significant relief.  He describes persistent burning pain.  Symptoms are not worse with bowel movements.  Denies any hematochezia.  Abdominal pain or fevers or chills.  Denies any injury or trauma or inserting any foreign bodies into his rectum.  HPI     Home Medications Prior to Admission medications   Medication Sig Start Date End Date Taking? Authorizing Provider  albuterol (VENTOLIN HFA) 108 (90 Base) MCG/ACT inhaler Inhale 2 puffs into the lungs every 4 (four) hours as needed for wheezing or shortness of breath. 06/19/20   [provider]  guaiFENesin-codeine 100-10 MG/5ML syrup Take 5 mLs by mouth 2 (two) times daily as needed for cough. Patient not taking: Reported on 08/21/2020 07/24/20   [provider]  Hydrocortisone, Perianal, 1 % CREA Apply 1 application topically daily as needed (sore). 02/21/20   [provider]  QUEtiapine (SEROQUEL) 100 MG tablet Take 1 tablet (100 mg total) by mouth every morning. 08/13/20 09/12/20  Dorcas Carrow, MD  QUEtiapine (SEROQUEL) 100 MG tablet TAKE 1 TABLET (100 MG TOTAL) BY MOUTH EVERY MORNING. 08/12/20 08/12/21  Dorcas Carrow, MD  QUEtiapine (SEROQUEL) 200 MG tablet Take 1 tablet (200 mg total) by mouth at bedtime. 08/12/20 09/11/20  Dorcas Carrow, MD  QUEtiapine (SEROQUEL) 200 MG tablet TAKE 1 TABLET (200 MG TOTAL) BY MOUTH AT BEDTIME. 08/12/20 08/12/21  Dorcas Carrow, MD  traZODone (DESYREL) 50 MG tablet Take 100 mg by mouth at bedtime. 02/21/20   [provider]   Vitamin D, Ergocalciferol, (DRISDOL) 1.25 MG (50000 UNIT) CAPS capsule Take 50,000 Units by mouth every Thursday. 07/25/20   [provider]      Allergies    Patient has no known allergies.    Review of Systems   Review of Systems  Constitutional:  Negative for fever.  Gastrointestinal:  Positive for rectal pain. Negative for anal bleeding and blood in stool.    Physical Exam Updated Vital Signs BP (!) 145/94 (BP Location: Right Arm)   Pulse 73   Temp 97.6 F (36.4 C) (Oral)   Resp 16   Ht 5\' 9"  (1.753 m)   Wt 70.8 kg   SpO2 99%   BMI 23.04 kg/m  Physical Exam Vitals and nursing note reviewed. Exam conducted with a chaperone present.  Constitutional:      General: He is not in acute distress.    Appearance: He is well-developed.  HENT:     Head: Normocephalic and atraumatic.  Eyes:     Conjunctiva/sclera: Conjunctivae normal.  Cardiovascular:     Rate and Rhythm: Normal rate and regular rhythm.  Pulmonary:     Effort: Pulmonary effort is normal. No respiratory distress.  Genitourinary:    Comments: No obvious hemorrhoid is present, no anal fissures present, multiple surrounding excoriations noted without erythema.  No fluctuance. Musculoskeletal:        General: No swelling.     Cervical back: Neck supple.  Skin:    General: Skin is warm and dry.  Capillary Refill: Capillary refill takes less than 2 seconds.  Neurological:     Mental Status: He is alert.  Psychiatric:        Mood and Affect: Mood normal.     ED Results / Procedures / Treatments   Labs (all labs ordered are listed, but only abnormal results are displayed) Labs Reviewed - No data to display  EKG None  Radiology No results found.  Procedures Procedures    Medications Ordered in ED Medications - No data to display  ED Course/ Medical Decision Making/ A&P                                 Medical Decision Making  This patient presents to the ED with chief complaint(s)  of rectal pain with pertinent past medical history of hemorrhoids.  The complaint involves an extensive differential diagnosis and also carries with it a high risk of complications and morbidity.    The differential diagnosis includes  Hemorrhoids, thrombosed hemorrhoids, anal fissures, proctitis, pilonidal cyst, foreign body  The initial plan is to  Referral to GI, follow-up PCP, continue current regiment Additional history obtained: No additional historians or records utilized  Initial Assessment:   Patient is hemodynamically stable, afebrile.  History consistent with hemorrhoids.  Rectal exam does not demonstrate any obvious hemorrhoids however. Could have deeper internal.  No fluctuance or erythema to suggest abscess.  He does have some surrounding excoriations. Denies pruritus.  He is already utilizing hydrocortisone and Preparation H.   Independent ECG/labs interpretation:  none  Independent visualization and interpretation of imaging: None indicated  Treatment and Reassessment: No medications given during patient visit  Consultations obtained:   None  Disposition:   Patient discharged home.  Encouraged to follow-up with PCP, given referral for GI.  Encouraged to continue current regiment and increase consumption of fiber and water.  The patient has been appropriately medically screened and/or stabilized in the ED. I have low suspicion for any other emergent medical condition which would require further screening, evaluation or treatment in the ED or require inpatient management. At time of discharge the patient is hemodynamically stable and in no acute distress. I have discussed work-up results and diagnosis with patient and answered all questions. Patient is agreeable with discharge plan. We discussed strict return precautions for returning to the emergency department and they verbalized understanding.      Social Determinants of Health:   None   This note was dictated with  voice recognition software.  Despite best efforts at proofreading, errors may have occurred which can change the documentation meaning.         Final Clinical Impression(s) / ED Diagnoses Final diagnoses:  Rectal pain    Rx / DC Orders ED Discharge Orders     None         Halford Decamp, PA-C 04/19/23 1631    Rozelle Logan, DO 04/19/23 2311

## 2023-04-19 NOTE — ED Triage Notes (Addendum)
Pt c/o rectal pain. Pt states his rectum feels raw on the outside. Pt states he was seen at PCP for same, prescribed preparation H, didn't work. Pt was then prescribed hydrocortisone suppositories without relief. Pt denies urinary symptoms. Pt denies bleeding.

## 2023-04-26 ENCOUNTER — Encounter: Payer: Self-pay | Admitting: Gastroenterology

## 2023-04-26 ENCOUNTER — Ambulatory Visit (INDEPENDENT_AMBULATORY_CARE_PROVIDER_SITE_OTHER): Payer: Medicare Other | Admitting: Gastroenterology

## 2023-04-26 VITALS — BP 122/32 | HR 86 | Temp 98.7°F | Ht 69.0 in | Wt 156.6 lb

## 2023-04-26 DIAGNOSIS — K626 Ulcer of anus and rectum: Secondary | ICD-10-CM

## 2023-04-26 DIAGNOSIS — K219 Gastro-esophageal reflux disease without esophagitis: Secondary | ICD-10-CM

## 2023-04-26 DIAGNOSIS — K6289 Other specified diseases of anus and rectum: Secondary | ICD-10-CM | POA: Diagnosis not present

## 2023-04-26 DIAGNOSIS — K629 Disease of anus and rectum, unspecified: Secondary | ICD-10-CM

## 2023-04-26 DIAGNOSIS — Z114 Encounter for screening for human immunodeficiency virus [HIV]: Secondary | ICD-10-CM

## 2023-04-26 DIAGNOSIS — Z1211 Encounter for screening for malignant neoplasm of colon: Secondary | ICD-10-CM

## 2023-04-26 NOTE — Patient Instructions (Addendum)
We will get you scheduled for an upper endoscopy as well as a colonoscopy in the near future with Dr. Marletta Lor.  I want you to apply some rectal lidocaine (look for Preparation H brand that only has lidocaine) and apply it with Desitin or Boudreau's Butt paste.  Please apply this twice a day to help provide you some relief of the burning and itching.  You can take Tylenol 500 mg every 8 hours for the pain.  Please ensure you are washing your hands well.  Please have blood work completed at [facility].  We will call you with results once they have been received. Please allow 3-5 business days for review. 2 locations for [facility] in [place]:              1. [address], [place]              2. [address] [place],    Follow a GERD diet:  Avoid fried, fatty, greasy, spicy, citrus foods. Avoid caffeine and carbonated beverages. Avoid chocolate. Try eating 4-6 small meals a day rather than 3 large meals. Do not eat within 3 hours of laying down. Prop head of bed up on wood or bricks to create a 6 inch incline.   After receiving your blood work if there is anything that is positive including checking for active herpes infection then I will send an appropriate treatment.  It was a pleasure to see you today. I want to create trusting relationships with patients. If you receive a survey regarding your visit,  I greatly appreciate you taking time to fill this out on paper or through your MyChart. I value your feedback.  Brooke Bonito, MSN, FNP-BC, AGACNP-BC [facility]

## 2023-04-26 NOTE — Progress Notes (Signed)
GI Office Note    Referring Provider: Marylynn Pearson, FNP Primary Care Physician:  Marylynn Pearson, FNP Primary Gastroenterologist: Hennie Duos. 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 Pt states he is no better    History of Present Illness  Isam Zanger is a 62 y.o. male with a history of reflux, empyema, and hemorrhoids presenting today with complaint of   Last office visit 05/22/2020.  Patient presented with chronic cough as well as mucus production and had reported concerns for aspiration.  Had a recent CT chest abdomen pelvis which showed pulmonary findings and was unremarkable from a GI standpoint.  He denied any dysphagia or odynophagia.  Did note chronic acid reflux for which she was taking omeprazole as needed.  Chronic tobacco use.  Denied any abdominal pain.  Has not had any form of colon cancer screening.  Denied any unintentional weight loss.  He was advised to schedule an EGD for further evaluation of his chronic cough and reflux.  Recommended screening colonoscopy but patient deferred.  He was originally scheduled for EGD in early 2022 and then canceled due to financial concerns.  Recent ED visit 04/19/2023.  He reported history of hemorrhoids and presented with rectal pain.  His PCP prescribed him Preparation H and hydrocortisone suppositories which he tried for 6 days without any relief.  He described persistent burning pain.  Denied symptoms being worse with bowel movements and also denied any hematochezia.  Rectal exam performed by ED physician which noted no obvious hemorrhoids, no anal fissure, and multiple surrounding excoriations noted without erythema.  No fluctuance.  He was advised to follow-up with PCP and given GI referral.  Today:  He reports a a chronic rash throughout his legs that spread to his  bottom and feels like it is on his arms as well. On his leg it itches. In the perineum area and anorectal area he repots he scratched it and it was having some bleeding. He reports it has been very sore. Does not eat much at baseline. He reports he has been through some rough times lately and going through disability claims and he has a rough home life.   He denies any issues with constipation, diarrhea, changes in bowel habits.  He also denies any nausea or vomiting but does report chronic mucus production and some occasional burning and heartburn symptoms.  Does not currently take anything over-the-counter.  Denies any frequent NSAIDs.  Does admit to regular tobacco use.  And some intermittent alcohol use as well.  Denies any abdominal pain.  He reports years ago he did lose a large amount of weight but overall has been fairly stable since then.  According to review of weights his weight in March 2022 was 119 pounds and he is up to 156 pounds today.  Wt Readings from Last 3 Encounters:  04/26/23 156 lb 9.6 oz (71 kg)  04/19/23 156 lb (70.8 kg)  08/21/20 119 lb (54 kg)    Current Outpatient Medications  Medication Sig Dispense Refill   albuterol (VENTOLIN HFA) 108 (90 Base) MCG/ACT inhaler Inhale 2 puffs into the lungs every 4 (four) hours as needed for wheezing or shortness of breath.     guaiFENesin-codeine 100-10 MG/5ML syrup Take 5 mLs by mouth 2 (two) times daily as needed for  cough. (Patient not taking: Reported on 08/21/2020)     Hydrocortisone, Perianal, 1 % CREA Apply 1 application topically daily as needed (sore).     QUEtiapine (SEROQUEL) 100 MG tablet Take 1 tablet (100 mg total) by mouth every morning. 30 tablet 0   QUEtiapine (SEROQUEL) 100 MG tablet TAKE 1 TABLET (100 MG TOTAL) BY MOUTH EVERY MORNING. 30 tablet 0   QUEtiapine (SEROQUEL) 200 MG tablet Take 1 tablet (200 mg total) by mouth at bedtime. 30 tablet 0   QUEtiapine (SEROQUEL) 200 MG tablet TAKE 1 TABLET (200 MG TOTAL) BY  MOUTH AT BEDTIME. 30 tablet 0   traZODone (DESYREL) 50 MG tablet Take 100 mg by mouth at bedtime.     Vitamin D, Ergocalciferol, (DRISDOL) 1.25 MG (50000 UNIT) CAPS capsule Take 50,000 Units by mouth every Thursday.     No current facility-administered medications for this visit.    Past Medical History:  Diagnosis Date   Empyema (HCC) 03/03/2018; 03/14/2018   GERD (gastroesophageal reflux disease)     Past Surgical History:  Procedure Laterality Date   NO PAST SURGERIES      Family History  Problem Relation Age of Onset   Alzheimer's disease Mother    Alzheimer's disease Father     Allergies as of 04/26/2023   (No Known Allergies)    Social History   Socioeconomic History   Marital status: Married    Spouse name: Not on file   Number of children: Not on file   Years of education: Not on file   Highest education level: Not on file  Occupational History   Not on file  Tobacco Use   Smoking status: Every Day    Current packs/day: 0.50    Average packs/day: 0.5 packs/day for 45.0 years (22.5 ttl pk-yrs)    Types: Cigarettes   Smokeless tobacco: Never  Vaping Use   Vaping status: Never Used  Substance and Sexual Activity   Alcohol use: Yes    Comment: 03/03/2018 "3-4 / day and more on weekends "' 05/22/20 avg 1-2 daily   Drug use: Never   Sexual activity: Not Currently  Other Topics Concern   Not on file  Social History Narrative   Not on file   Social Determinants of Health   Financial Resource Strain: Not on file  Food Insecurity: Not on file  Transportation Needs: Not on file  Physical Activity: Not on file  Stress: Not on file  Social Connections: Not on file     Review of Systems   Gen: Denies fever, chills, anorexia. Denies fatigue, weakness, weight loss.  CV: Denies chest pain, palpitations, syncope, peripheral edema, and claudication. Resp: Denies dyspnea at rest, cough, wheezing, coughing up blood, and pleurisy. GI: See HPI Derm: Denies  rash, itching, dry skin Psych: Denies depression, anxiety, memory loss, confusion. No homicidal or suicidal ideation.  Heme: Denies bruising, bleeding, and enlarged lymph nodes.  Physical Exam   BP (!) 175/87   Pulse 86   Temp 98.7 F (37.1 C)   Ht 5\' 9"  (1.753 m)   Wt 156 lb 9.6 oz (71 kg)   BMI 23.13 kg/m   General:   Alert and oriented. No distress noted. Pleasant and cooperative.  Head:  Normocephalic and atraumatic. Eyes:  Conjuctiva clear without scleral icterus. Mouth: Poor dentition Lungs:  Clear to auscultation bilaterally. No wheezes, rales, or rhonchi. No distress.  Heart:  S1, S2 present without murmurs appreciated.  Abdomen:  +BS, soft, non-tender and non-distended.  No rebound or guarding. No HSM or masses noted. Rectal: Multiple oozing and ulcerated lesions noted in the perianal region and gluteal cleft bilaterally.  Tender to touch.  DRE with good rectal tone and some mild firmness to the right posterior region. Msk:  Symmetrical without gross deformities. Normal posture. Extremities:  Without edema. Neurologic:  Alert and  oriented x4 Psych:  Alert and cooperative. Normal mood and affect.  Assessment  Abdourahmane Stasi is a 62 y.o. male with a history of reflux, empyema, and hemorrhoids presenting today for evaluation of rectal pain.  Perirectal ulcerations, rectal pain: Presents with almost 2 weeks of perianal itching along with pain.  After itching occasionally has some bleeding associated with this.  Has been applying Preparation H cream as directed by PCP and ED.  Rectal exam today without any evidence of obvious hemorrhoids but with some firmness noted to the right posterior column within the rectum.  Mild firmness along lesions this will and they are ulcerated and oozing.  No obvious anal fissure present.  For now I have advised over-the-counter lidocaine mixed with Desitin to apply twice daily to the perianal area to help with discomfort and will assess labs including  testing for HSV.  If antibodies are positive or any elevation of white count then I will treat as if HSV as these lesions are very suspicious for this.  GERD: Has had chronic intermittent symptoms.  Has some productive mucus production as well.  Currently not taking anything over-the-counter.  Reinforced GERD diet and lifestyle modifications today.  Will proceed with an upper endoscopy for further evaluation given his complaints.  Screening colon cancer: Has never had a screening colonoscopy in the past.  Given his current possible HSV infection we will assess for this while awaiting scheduling for procedures.  He has agreed to proceed with colonoscopy.  PLAN   Proceed with upper endoscopy and colonoscopy with propofol by Dr. Marletta Lor in near future: the risks, benefits, and alternatives have been discussed with the patient in detail. The patient states understanding and desires to proceed. ASA 2 Rectal lidocaine to apply externally mixed with desitin Bid to perianal area CBC, CMP, HSV, HIV, CRP Will treat with antiviral if HSV positive GERD diet Follow-up post colonoscopy    Brooke Bonito, MSN, FNP-BC, AGACNP-BC [facility]

## 2023-05-05 ENCOUNTER — Telehealth: Payer: Self-pay | Admitting: *Deleted

## 2023-05-05 NOTE — Telephone Encounter (Signed)
Called pt to schedule TCS/EGD with Dr. Marletta Lor ASA 2. He stated he needed to hold off on this right now. He will call us when he is ready to schedule

## 2023-06-18 LAB — COMPREHENSIVE METABOLIC PANEL
ALT: 46 [IU]/L — ABNORMAL HIGH (ref 0–44)
AST: 54 [IU]/L — ABNORMAL HIGH (ref 0–40)
Albumin: 3.9 g/dL (ref 3.9–4.9)
Alkaline Phosphatase: 99 [IU]/L (ref 44–121)
BUN/Creatinine Ratio: 8 — ABNORMAL LOW (ref 10–24)
BUN: 8 mg/dL (ref 8–27)
Bilirubin Total: 0.9 mg/dL (ref 0.0–1.2)
CO2: 24 mmol/L (ref 20–29)
Calcium: 9 mg/dL (ref 8.6–10.2)
Chloride: 96 mmol/L (ref 96–106)
Creatinine, Ser: 1.03 mg/dL (ref 0.76–1.27)
Globulin, Total: 3.6 g/dL (ref 1.5–4.5)
Glucose: 88 mg/dL (ref 70–99)
Potassium: 5.4 mmol/L — ABNORMAL HIGH (ref 3.5–5.2)
Sodium: 135 mmol/L (ref 134–144)
Total Protein: 7.5 g/dL (ref 6.0–8.5)
eGFR: 82 mL/min/{1.73_m2} (ref >59)

## 2023-06-18 LAB — HSV 1 AND 2 AB, IGG
HSV 1 Glycoprotein G Ab, IgG: REACTIVE — AB
HSV 2 IgG, Type Spec: REACTIVE — AB

## 2023-06-18 LAB — C-REACTIVE PROTEIN: CRP: 20 mg/L — ABNORMAL HIGH (ref 0–10)

## 2023-06-18 LAB — CBC
Hematocrit: 43.2 % (ref 37.5–51.0)
Hemoglobin: 14.6 g/dL (ref 13.0–17.7)
MCH: 31.9 pg (ref 26.6–33.0)
MCHC: 33.8 g/dL (ref 31.5–35.7)
MCV: 94 fL (ref 79–97)
Platelets: 275 10*3/uL (ref 150–450)
RBC: 4.58 x10E6/uL (ref 4.14–5.80)
RDW: 12.3 % (ref 11.6–15.4)
WBC: 7.2 10*3/uL (ref 3.4–10.8)

## 2023-06-18 LAB — HIV ANTIBODY (ROUTINE TESTING W REFLEX): HIV Screen 4th Generation wRfx: NONREACTIVE

## 2023-09-28 ENCOUNTER — Other Ambulatory Visit (HOSPITAL_COMMUNITY): Payer: Self-pay | Admitting: Adult Health

## 2023-09-28 ENCOUNTER — Ambulatory Visit (HOSPITAL_COMMUNITY)
Admission: RE | Admit: 2023-09-28 | Discharge: 2023-09-28 | Disposition: A | Discharge status: Home / Self Care | Source: Ambulatory Visit | Attending: Adult Health | Admitting: Adult Health

## 2023-09-28 DIAGNOSIS — R042 Hemoptysis: Secondary | ICD-10-CM | POA: Diagnosis present

## 2023-10-11 ENCOUNTER — Other Ambulatory Visit (HOSPITAL_COMMUNITY): Payer: Self-pay | Admitting: Adult Health

## 2023-10-11 DIAGNOSIS — R9389 Abnormal findings on diagnostic imaging of other specified body structures: Secondary | ICD-10-CM

## 2023-10-15 ENCOUNTER — Ambulatory Visit (HOSPITAL_COMMUNITY)
Admission: RE | Admit: 2023-10-15 | Discharge: 2023-10-15 | Disposition: A | Discharge status: Home / Self Care | Source: Ambulatory Visit | Attending: Adult Health | Admitting: Adult Health

## 2023-10-15 DIAGNOSIS — R9389 Abnormal findings on diagnostic imaging of other specified body structures: Secondary | ICD-10-CM | POA: Insufficient documentation
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
## 2023-12-01 ENCOUNTER — Encounter: Payer: Self-pay | Admitting: *Deleted

## 2023-12-01 ENCOUNTER — Ambulatory Visit: Admitting: Gastroenterology

## 2023-12-01 ENCOUNTER — Encounter: Payer: Self-pay | Admitting: Gastroenterology

## 2023-12-01 VITALS — BP 131/73 | HR 73 | Temp 98.6°F | Ht 69.0 in | Wt 152.8 lb

## 2023-12-01 DIAGNOSIS — K219 Gastro-esophageal reflux disease without esophagitis: Secondary | ICD-10-CM | POA: Diagnosis not present

## 2023-12-01 DIAGNOSIS — R894 Abnormal immunological findings in specimens from other organs, systems and tissues: Secondary | ICD-10-CM

## 2023-12-01 DIAGNOSIS — K649 Unspecified hemorrhoids: Secondary | ICD-10-CM

## 2023-12-01 DIAGNOSIS — Z1211 Encounter for screening for malignant neoplasm of colon: Secondary | ICD-10-CM

## 2023-12-01 DIAGNOSIS — K6289 Other specified diseases of anus and rectum: Secondary | ICD-10-CM | POA: Diagnosis not present

## 2023-12-01 DIAGNOSIS — K629 Disease of anus and rectum, unspecified: Secondary | ICD-10-CM

## 2023-12-01 MED ORDER — NYSTATIN 100000 UNIT/GM EX CREA: 1.0000 | TOPICAL_CREAM | Freq: Three times a day (TID) | CUTANEOUS | 2 refills | Status: DC

## 2023-12-01 MED ORDER — PANTOPRAZOLE SODIUM 40 MG PO TBEC: 40.0000 mg | DELAYED_RELEASE_TABLET | Freq: Every day | ORAL | 3 refills | Status: DC

## 2023-12-01 MED ORDER — PEG 3350-KCL-NA BICARB-NACL 420 G PO SOLR: 4000.0000 mL | Freq: Once | ORAL | 0 refills | Status: AC

## 2023-12-01 NOTE — Patient Instructions (Addendum)
 Nystatin  cream applied three times a day for 2 weeks.  Continue baby diaper cream but only apply after nystain cream Pantoprazole  40 mg once daily.  Continue valtrex daily  We need to get you scheduled for upper endoscopy and colonoscopy in the near future.   Follow a GERD diet:  Avoid fried, fatty, greasy, spicy, citrus foods. Avoid caffeine and carbonated beverages. Avoid chocolate. Try eating 4-6 small meals a day rather than 3 large meals. Do not eat within 3 hours of laying down. Prop head of bed up on wood or bricks to create a 6 inch incline.  Follow up in 3 months   It was a pleasure to see you today. I want to create trusting relationships with patients. If you receive a survey regarding your visit,  I greatly appreciate you taking time to fill this out on paper or through your MyChart. I value your feedback.  Charmaine Melia, MSN, FNP-BC, AGACNP-BC [facility]

## 2023-12-12 ENCOUNTER — Other Ambulatory Visit: Payer: Self-pay | Admitting: Gastroenterology

## 2023-12-12 DIAGNOSIS — K219 Gastro-esophageal reflux disease without esophagitis: Secondary | ICD-10-CM

## 2023-12-30 NOTE — OR Nursing (Signed)
 Spoke with patient to confirm date and time for procedure. Pt stated he was scared to be put to sleep because he has never been put to sleep before. Pt stated he does not want to have the procedures done. Message sent to the office.

## 2023-12-31 ENCOUNTER — Telehealth: Payer: Self-pay | Admitting: Gastroenterology

## 2023-12-31 ENCOUNTER — Telehealth: Payer: Self-pay | Admitting: *Deleted

## 2023-12-31 NOTE — Telephone Encounter (Signed)
 Received message from endo stating Hey. I called Makoa Satz to confirm his procedure date and time (01/04/2024) and he is scared to have this procedure done because he has never been put to sleep. He does not want to have it done.  Spoke with pt. He reports he is not having it done. He wants it cancelled and refused to reschedule it. RICK Pfeiffer

## 2023-12-31 NOTE — Telephone Encounter (Signed)
 Error

## 2024-01-02 NOTE — Progress Notes (Signed)
 Derrick Beltran, male    DOB: 11/26/1960    MRN: [id number]   Brief patient profile:  16  yobm  active smoker  referred to pulmonary clinic in [place]  01/05/2024 by Alfonso Donalds PA  for  hemoptysis    Dr claudene 08/03/20 last  eval for R nec pneumoia/ empyema pattern of non-adherence and leaving AMA documented    History of Present Illness  01/05/2024  Pulmonary/ 1st office eval/ Derrick Beltran / [place] [facility]  Chief Complaint  Patient presents with   Establish Care    Mucus yellow and red   Dyspnea:  ok with yardwork= riding mower breathing  fine/ cc weak > sob limited Cough: minimal blood /yellowish x good while   = weeks to months  Sleep: flat bed / 2 pillows  SABA use: none  02: none    No obvious day to day or daytime pattern/variability or assoc  mucus plugs or R cp or chest tightness, subjective wheeze or overt sinus or hb symptoms.    Also denies any obvious fluctuation of symptoms with weather or environmental changes or other aggravating or alleviating factors except as outlined above   No unusual exposure hx or h/o childhood pna/ asthma or knowledge of premature birth.  Current Allergies, Complete Past Medical History, Past Surgical History, Family History, and Social History were reviewed in [employer] record.  ROS  The following are not active complaints unless bolded Hoarseness, sore throat, dysphagia, dental problems, itching, sneezing,  nasal congestion or discharge of excess mucus or purulent secretions, ear ache,   fever, chills, sweats, unintended wt loss or wt gain, classically pleuritic or exertional cp,  orthopnea pnd or arm/hand swelling  or leg swelling, presyncope, palpitations, abdominal pain, anorexia, nausea, vomiting, diarrhea  or change in bowel habits or change in bladder habits, change in stools or change in urine, dysuria, hematuria,  rash, arthralgias, visual complaints, headache, numbness, weakness or ataxia or problems with walking  or coordination,  change in mood or  memory.            Outpatient Medications Prior to Visit  Medication Sig Dispense Refill   hydrocortisone  cream 1 % Apply 1 Application topically 2 (two) times daily.     Hydrocortisone , Perianal, 1 % CREA Apply 1 application topically daily as needed (sore).     lidocaine  (XYLOCAINE ) 2 % jelly as directed.     nystatin  cream (MYCOSTATIN ) Apply 1 Application topically 3 (three) times daily. 30 g 2   pantoprazole  (PROTONIX ) 40 MG tablet TAKE 1 TABLET BY MOUTH EVERY DAY 90 tablet 1   valACYclovir (VALTREX) 1000 MG tablet Take 1,000 mg by mouth daily.     clindamycin (CLINDAGEL) 1 % gel Apply topically 2 (two) times daily as needed.     QUEtiapine  (SEROQUEL ) 100 MG tablet Take 1 tablet (100 mg total) by mouth every morning. 30 tablet 0   QUEtiapine  (SEROQUEL ) 100 MG tablet TAKE 1 TABLET (100 MG TOTAL) BY MOUTH EVERY MORNING. 30 tablet 0   QUEtiapine  (SEROQUEL ) 200 MG tablet Take 1 tablet (200 mg total) by mouth at bedtime. 30 tablet 0   QUEtiapine  (SEROQUEL ) 200 MG tablet TAKE 1 TABLET (200 MG TOTAL) BY MOUTH AT BEDTIME. 30 tablet 0   traZODone  (DESYREL ) 50 MG tablet Take 100 mg by mouth at bedtime. (Patient not taking: Reported on 12/01/2023)     Vitamin D, Ergocalciferol, (DRISDOL) 1.25 MG (50000 UNIT) CAPS capsule Take 50,000 Units by mouth every Thursday. (Patient  not taking: Reported on 12/01/2023)     No facility-administered medications prior to visit.    Past Medical History:  Diagnosis Date   Empyema (HCC) 03/03/2018; 03/14/2018   GERD (gastroesophageal reflux disease)       Objective:     BP (!) 144/82   Pulse 88   Ht 5' 9 (1.753 m)   Wt 156 lb 6.4 oz (70.9 kg)   SpO2 98% Comment: ra  BMI 23.10 kg/m   Wt Readings from Last 3 Encounters:  01/05/24 156 lb 6.4 oz (70.9 kg)  12/01/23 152 lb 12.8 oz (69.3 kg)  04/26/23 156 lb 9.6 oz (71 kg)     SpO2: 98 % (ra)  amb somber /stoic bm nad    HEENT : Oropharynx clear with  very poor  dentition   Nasal turbinates nl    NECK :  without  apparent JVD/ palpable Nodes/TM    LUNGS: no acc muscle use,  Nl contour chest which is clear to A and P bilaterally without cough on insp or exp maneuvers   CV:  RRR  no s3 or murmur or increase in P2, and no edema   ABD:  soft and nontender   MS:  Gait nl   ext warm without deformities Or obvious joint restrictions  calf tenderness, cyanosis or clubbing    SKIN: warm and dry without lesions    NEURO:  alert, approp, nl sensorium with  no motor or cerebellar deficits apparent.     I personally reviewed images and agree with radiology impression as follows:   Chest CTw/o contrast    10/15/23  1. 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       Lab Results  Component Value Date   HGB 14.0 01/05/2024   HGB 14.6 06/17/2023   HGB 9.8 (L) 08/21/2020   HGB 9.9 (L) 08/09/2020   HGB 10.1 (L) 08/06/2020     Labs ordered/ reviewed:      Chemistry      Component Value Date/Time   NA 128 (L) 01/05/2024 0958   K 5.6 (H) 01/05/2024 0958   CL 92 (L) 01/05/2024 0958   CO2 21 01/05/2024 0958   BUN 9 01/05/2024 0958   CREATININE 1.07 01/05/2024 0958      Component Value Date/Time   CALCIUM 9.3 01/05/2024 0958   ALKPHOS 99 06/17/2023 1159   AST 54 (H) 06/17/2023 1159   ALT 46 (H) 06/17/2023 1159   BILITOT 0.9 06/17/2023 1159        Lab Results  Component Value Date   WBC 7.6 01/05/2024   HGB 14.0 01/05/2024   HCT 42.2 01/05/2024   MCV 101 (H) 01/05/2024   PLT 257 01/05/2024         Lab Results  Component Value Date   TSH 1.830 01/05/2024          Lab Results  Component Value Date   ESRSEDRATE 29 01/05/2024      Quant Gold TB   01/05/24  indeterminate   BNP  01/05/2024     =   18      Assessment   Assessment & Plan Necrotizing pneumonia (HCC)  See admit  08/03/20 assoc with severe dental  deterioration chronically > left AMA  - ? New onset hemoptysis summer 2025 streaky /intermittent  -  01/05/2024   rec augmentin  x 10 d for likely bronchiectasis flare in one of his  destroyed areas of the R lung from prev necrotic inection that may have been related to teeth rotting > refer to dentist   pt does not appear acutely ill, not anemic does have mild decreased Na and increased K will ec return to [facility] office for cxr in 2 weeks and check fasting cortisol levels and repeat bmet    DOE (dyspnea on exertion) Active smoker with extensive but likely stable R lung scarring s/p nec pna in 2022  - 01/05/2024   Walked on RA  x  3  lap(s) =  approx 450  ft  @ mod pace, stopped due to 92%  with lowest 02 sats 88% and no sob so likely this is a chronic finding    No evidence of anemia from reported hemoptysis, very little evidence of copd so likely this is bronchiectasis related and no acute change from baseline    Re-eval in 2 weeks as above    Cigarette smoker Counseled re importance of smoking cessation but did not meet time criteria for separate billing    Poor dentition Assoc with nec pna in 2022 ? Source for asp pna  Strongly rec dental evaluation if he can afford it.  Each maintenance medication was reviewed in detail including emphasizing most importantly the difference between maintenance and prns and under what circumstances the prns are to be triggered using an action plan format where appropriate.  Total time for H and P, chart review, counseling,  directly observing portions of ambulatory 02 saturation study/ and generating customized AVS unique to this office visit / same day charting = 48 min with pt new to me                 Patient Instructions  For cough/ congestion > mucinex or mucinex dm  up to maximum of  1200 mg every 12 hours for cough and congestion   Augmentin  875 mg take one pill twice daily  X 10 days - take at breakfast and supper with large glass of water .   It would help reduce the usual side effects (diarrhea and yeast infections) if you ate cultured yogurt at lunch.   You need to see a dentist as soon as possible to reduce risk of further lung infections  Please remember to go to the lab department   for your tests - we will call you with the results when they are available.      I will arrange follow up at our [facility] office once  I have reviewed all your records      Add:   pt does not appear acutely ill, not anemic does have mild decreased Na and increased K will ec return to [facility] office for cxr in 2 weeks and check fasting cortisol levels and repeat bmet    Ozell America, MD 01/08/2024

## 2024-01-04 ENCOUNTER — Encounter (HOSPITAL_COMMUNITY): Admission: RE | Payer: Self-pay | Source: Home / Self Care

## 2024-01-04 ENCOUNTER — Ambulatory Visit (HOSPITAL_COMMUNITY): Admission: RE | Admit: 2024-01-04 | Source: Home / Self Care | Admitting: Internal Medicine

## 2024-01-04 SURGERY — COLONOSCOPY
Anesthesia: Choice

## 2024-01-05 ENCOUNTER — Ambulatory Visit (INDEPENDENT_AMBULATORY_CARE_PROVIDER_SITE_OTHER): Admitting: Internal Medicine

## 2024-01-05 ENCOUNTER — Encounter: Payer: Self-pay | Admitting: Internal Medicine

## 2024-01-05 VITALS — BP 144/82 | HR 88 | Ht 69.0 in | Wt 156.4 lb

## 2024-01-05 DIAGNOSIS — J850 Gangrene and necrosis of lung: Secondary | ICD-10-CM | POA: Diagnosis not present

## 2024-01-05 DIAGNOSIS — F17210 Nicotine dependence, cigarettes, uncomplicated: Secondary | ICD-10-CM | POA: Diagnosis not present

## 2024-01-05 DIAGNOSIS — K089 Disorder of teeth and supporting structures, unspecified: Secondary | ICD-10-CM

## 2024-01-05 DIAGNOSIS — R0609 Other forms of dyspnea: Secondary | ICD-10-CM | POA: Insufficient documentation

## 2024-01-05 DIAGNOSIS — R042 Hemoptysis: Secondary | ICD-10-CM | POA: Diagnosis not present

## 2024-01-05 MED ORDER — AMOXICILLIN-POT CLAVULANATE 875-125 MG PO TABS: 1.0000 | ORAL_TABLET | Freq: Two times a day (BID) | ORAL | 0 refills | Status: DC

## 2024-01-05 NOTE — Patient Instructions (Addendum)
 For cough/ congestion > mucinex or mucinex dm  up to maximum of  1200 mg every 12 hours for cough and congestion   Augmentin  875 mg take one pill twice daily  X 10 days - take at breakfast and supper with large glass of water .  It would help reduce the usual side effects (diarrhea and yeast infections) if you ate cultured yogurt at lunch.   You need to see a dentist as soon as possible to reduce risk of further lung infections  Please remember to go to the lab department   for your tests - we will call you with the results when they are available.      I will arrange follow up at our [facility] office once  I have reviewed all your records      Add:   pt does not appear acutely ill, not anemic does have mild decreased Na and increased K will ec return to [facility] office for cxr in 2 weeks and check fasting cortisol levels and repeat bmet

## 2024-01-08 ENCOUNTER — Encounter: Payer: Self-pay | Admitting: Internal Medicine

## 2024-01-08 ENCOUNTER — Ambulatory Visit: Payer: Self-pay | Admitting: Internal Medicine

## 2024-01-08 DIAGNOSIS — K089 Disorder of teeth and supporting structures, unspecified: Secondary | ICD-10-CM | POA: Insufficient documentation

## 2024-01-08 LAB — CBC WITH DIFFERENTIAL/PLATELET
Basophils Absolute: 0.1 x10E3/uL (ref 0.0–0.2)
Basos: 1 %
EOS (ABSOLUTE): 0.2 x10E3/uL (ref 0.0–0.4)
Eos: 3 %
Hematocrit: 42.2 % (ref 37.5–51.0)
Hemoglobin: 14 g/dL (ref 13.0–17.7)
Immature Grans (Abs): 0.1 x10E3/uL (ref 0.0–0.1)
Immature Granulocytes: 1 %
Lymphocytes Absolute: 0.6 x10E3/uL — ABNORMAL LOW (ref 0.7–3.1)
Lymphs: 8 %
MCH: 33.4 pg — ABNORMAL HIGH (ref 26.6–33.0)
MCHC: 33.2 g/dL (ref 31.5–35.7)
MCV: 101 fL — ABNORMAL HIGH (ref 79–97)
Monocytes Absolute: 1.1 x10E3/uL — ABNORMAL HIGH (ref 0.1–0.9)
Monocytes: 14 %
Neutrophils Absolute: 5.6 x10E3/uL (ref 1.4–7.0)
Neutrophils: 73 %
Platelets: 257 x10E3/uL (ref 150–450)
RBC: 4.19 x10E6/uL (ref 4.14–5.80)
RDW: 13.8 % (ref 11.6–15.4)
WBC: 7.6 x10E3/uL (ref 3.4–10.8)

## 2024-01-08 LAB — QUANTIFERON-TB GOLD PLUS
QuantiFERON Mitogen Value: 0.55 [IU]/mL
QuantiFERON Nil Value: 0.02 [IU]/mL
QuantiFERON TB1 Ag Value: 0.03 [IU]/mL
QuantiFERON TB2 Ag Value: 0.02 [IU]/mL
QuantiFERON-TB Gold Plus: NEGATIVE

## 2024-01-08 LAB — TSH: TSH: 1.83 u[IU]/mL (ref 0.450–4.500)

## 2024-01-08 LAB — BASIC METABOLIC PANEL WITH GFR
BUN/Creatinine Ratio: 8 — ABNORMAL LOW (ref 10–24)
BUN: 9 mg/dL (ref 8–27)
CO2: 21 mmol/L (ref 20–29)
Calcium: 9.3 mg/dL (ref 8.6–10.2)
Chloride: 92 mmol/L — ABNORMAL LOW (ref 96–106)
Creatinine, Ser: 1.07 mg/dL (ref 0.76–1.27)
Glucose: 112 mg/dL — ABNORMAL HIGH (ref 70–99)
Potassium: 5.6 mmol/L — ABNORMAL HIGH (ref 3.5–5.2)
Sodium: 128 mmol/L — ABNORMAL LOW (ref 134–144)
eGFR: 78 mL/min/1.73 (ref >59)

## 2024-01-08 LAB — SEDIMENTATION RATE: Sed Rate: 29 mm/h (ref 0–30)

## 2024-01-08 LAB — BRAIN NATRIURETIC PEPTIDE: BNP: 18 pg/mL (ref 0.0–100.0)

## 2024-01-08 NOTE — Assessment & Plan Note (Addendum)
 Assoc with nec pna in 2022 ? Source for asp pna  Strongly rec dental evaluation if he can afford it.  Each maintenance medication was reviewed in detail including emphasizing most importantly the difference between maintenance and prns and under what circumstances the prns are to be triggered using an action plan format where appropriate.  Total time for H and P, chart review, counseling,  directly observing portions of ambulatory 02 saturation study/ and generating customized AVS unique to this office visit / same day charting = 48 min with pt new to me

## 2024-01-08 NOTE — Assessment & Plan Note (Addendum)
 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

## 2024-01-08 NOTE — Assessment & Plan Note (Addendum)
 See admit  08/03/20 assoc with severe dental deterioration chronically > left AMA  - ? New onset hemoptysis summer 2025 streaky /intermittent  -  01/05/2024   rec augmentin  x 10 d for likely bronchiectasis flare in one of his destroyed areas of the R lung from prev necrotic inection that may have been related to teeth rotting > refer to dentist   pt does not appear acutely ill, not anemic does have mild decreased Na and increased K will ec return to [facility] office for cxr in 2 weeks and check fasting cortisol levels and repeat bmet

## 2024-01-08 NOTE — Assessment & Plan Note (Addendum)
 Counseled re importance of smoking cessation but did not meet time criteria for separate billing

## 2024-01-10 NOTE — Progress Notes (Signed)
 Called and spoke with pt regarding dr leisa recs at his next ov in 2 weeks and pt said maybe. Told him he will get a call to schedule and appt from my front office. NFN

## 2024-01-10 NOTE — Telephone Encounter (Signed)
 LVM for patient to call and discuss scheduling 2 week follow up with Dr. Darlean

## 2024-01-10 NOTE — Telephone Encounter (Signed)
 Patient returned my call and is scheduled for Monday 01/31/24 at 3:00pm will mail informatin to patient and he voiced his understanding

## 2024-01-26 ENCOUNTER — Other Ambulatory Visit (HOSPITAL_BASED_OUTPATIENT_CLINIC_OR_DEPARTMENT_OTHER): Payer: Self-pay

## 2024-01-30 NOTE — Progress Notes (Unsigned)
 Derrick Beltran, male    DOB: 12/31/1960    MRN: [id number]   Brief patient profile:  58  yobm  active smoker  referred to pulmonary clinic in [place]  01/05/2024 by Alfonso Donalds PA  for  hemoptysis    Dr claudene 08/03/20 last  eval for R nec pneumoia/ empyema pattern of non-adherence and leaving AMA documented   History of Present Illness  01/05/2024  Pulmonary/ 1st office eval/ Derrick Beltran / [facility]  Chief Complaint  Patient presents with   Establish Care    Mucus yellow and red   Dyspnea:  ok with yardwork= riding mower breathing  fine/ cc weak > sob limited Cough: minimal blood /yellowish x good while   = weeks to months  Sleep: flat bed / 2 pillows  SABA use: none  02: none Rec For cough/ congestion > mucinex or mucinex dm  up to maximum of  1200 mg every 12 hours for cough and congestion  Augmentin  875 mg take one pill twice daily  X 10 days - take at breakfast and supper with large glass of water .  It would help reduce the usual side effects (diarrhea and yeast infections) if you ate cultured yogurt at lunch.  You need to see a dentist as soon as possible to reduce risk of further lung infections Please remember to go to the lab department   for your tests - we will call you with the results when they are available. I will arrange follow up at our [facility] office once  I have reviewed all your records      Add:   pt does not appear acutely ill, not anemic does have mild decreased Na and increased K will ec return to [facility] office for cxr in 2 weeks and check fasting cortisol levels and repeat bmet    01/31/2024  f/u ov/[place] office/Derrick Beltran re: R nec pneumoia/ empyema maint on no resp dz  active  smoker Chief Complaint  Patient presents with   Cough    2 week f/u  Dyspnea:  denies but very sedentary  Cough:  some better ? Not sure if on mucinex / no longer anything purulent or bloody  Sleeping: flat bed 2 pillows  s resp cc  SABA use: none  02: none       No obvious  day to day or daytime variability or assoc excess/ purulent sputum or mucus plugs or hemoptysis or cp or chest tightness, subjective wheeze or overt sinus or hb symptoms.    Also denies any obvious fluctuation of symptoms with weather or environmental changes or other aggravating or alleviating factors except as outlined above   No unusual exposure hx or h/o childhood pna/ asthma or knowledge of premature birth.  Current Allergies, Complete Past Medical History, Past Surgical History, Family History, and Social History were reviewed in [employer] record.  ROS  The following are not active complaints unless bolded Hoarseness, sore throat, dysphagia, dental problems, itching, sneezing,  nasal congestion or discharge of excess mucus or purulent secretions, ear ache,   fever, chills, sweats, unintended wt loss or wt gain, classically pleuritic or exertional cp,  orthopnea pnd or arm/hand swelling  or leg swelling, presyncope, palpitations, abdominal pain, anorexia, nausea, vomiting, diarrhea  or change in bowel habits or change in bladder habits, change in stools or change in urine, dysuria, hematuria,  rash perirectal/not responding to [facility] cream   arthralgias, visual complaints, headache, numbness, weakness or ataxia or problems  with walking or coordination,  change in mood or  memory.         Meds:  does not know any names or what meds he's on for what problem     Past Medical History:  Diagnosis Date   Empyema (HCC) 03/03/2018; 03/14/2018   GERD (gastroesophageal reflux disease)       Objective:     wts  01/31/2024          158    01/05/24 156 lb 6.4 oz (70.9 kg)  12/01/23 152 lb 12.8 oz (69.3 kg)  04/26/23 156 lb 9.6 oz (71 kg)    Vital signs reviewed  01/31/2024  - Note at rest 02 sats  97% on RA   General appearance:    chronically ill amb  bm nad    HEENT : Oropharynx  clear / dentition poor         NECK :  without  apparent JVD/ palpable Nodes/TM     LUNGS: no acc muscle use,  Nl contour chest with scattered insp and exp rhonchi R > L   CV:  RRR  no s3 or murmur or increase in P2, and no edema   ABD:  soft and nontender   MS:  Gait nl   ext warm without deformities Or obvious joint restrictions  calf tenderness, cyanosis or clubbing    SKIN: warm and dry without lesions  / excoriated eliptical rash around rectum   NEURO:  alert, approp, nl sensorium with  no motor or cerebellar deficits apparent.             Chemistry      Component Value Date/Time   NA 128 (L) 01/05/2024 0958   K 5.6 (H) 01/05/2024 0958   CL 92 (L) 01/05/2024 0958   CO2 21 01/05/2024 0958   BUN 9 01/05/2024 0958   CREATININE 1.07 01/05/2024 0958      Component Value Date/Time   CALCIUM 9.3 01/05/2024 0958   ALKPHOS 99 06/17/2023 1159   AST 54 (H) 06/17/2023 1159   ALT 46 (H) 06/17/2023 1159   BILITOT 0.9 06/17/2023 1159          Quant Gold TB   01/05/24  indeterminate        Assessment   Assessment & Plan Necrotizing pneumonia (HCC) See admit  08/03/20 assoc with severe dental deterioration chronically > left AMA  - ? New onset hemoptysis summer 2025 streaky /intermittent  -  01/05/2024   rec augmentin  x 10 d > refused f/u labs/ cxr 01/31/2024 so pulmonary f/u is prn   Poor dentition Assoc with nec pna in 2022 ? Source for asp pna  Again strongly advised dental care       Total time for H and P, chart review, counseling,  and generating customized AVS unique to this office visit / same day charting = 30 min attempting to explain to him the background of the pna/ empyema and what we've attemptd to do to date to which he  responded don't know anything about what your saying, did nt remember admits or chest tube on R  and then left s getting any fo the f/u studies I recommended.         Cigarette smoker 4-5 min discussion re active cigarette smoking in addition to office E&M  Ask about tobacco use:   ongoing  Advise quitting:  I took  an extended  opportunity with this patient to outline the consequences of continued  cigarette use  in airway disorders based on all the data we have from the multiple national lung health studies (perfomed over decades at millions of dollars in cost)  indicating that smoking cessation, not choice of inhalers or pulmonary physicians, is the most important aspect of his care.   Assess willingness:  Not committed at this point Assist in quit attempt:  Per PCP when ready Arrange follow up:   Follow up per Primary Care planned          AVS  Patient Instructions  Try to stop smoking before smoking stops you   Apply baba powder to rectal area twice daily after first washing with mild soap and water  and drying thoroughly   Please remember to go to the lab department   for your tests - we will call you with the results when they are available.      Please remember to go to the  x-ray department  @  [facility] for your tests - we will call you with the results when they are available      Follow up here will depend on results of the above studies      Ozell America, MD 01/31/2024

## 2024-01-31 ENCOUNTER — Encounter: Payer: Self-pay | Admitting: Internal Medicine

## 2024-01-31 ENCOUNTER — Ambulatory Visit: Admitting: Internal Medicine

## 2024-01-31 VITALS — BP 166/83 | HR 65 | Ht 69.0 in | Wt 158.0 lb

## 2024-01-31 DIAGNOSIS — J850 Gangrene and necrosis of lung: Secondary | ICD-10-CM | POA: Diagnosis not present

## 2024-01-31 DIAGNOSIS — R0609 Other forms of dyspnea: Secondary | ICD-10-CM

## 2024-01-31 DIAGNOSIS — F17210 Nicotine dependence, cigarettes, uncomplicated: Secondary | ICD-10-CM | POA: Diagnosis not present

## 2024-01-31 DIAGNOSIS — K089 Disorder of teeth and supporting structures, unspecified: Secondary | ICD-10-CM

## 2024-01-31 NOTE — Assessment & Plan Note (Addendum)

## 2024-01-31 NOTE — Assessment & Plan Note (Addendum)
 Assoc with nec pna in 2022 ? Source for asp pna  Again strongly advised dental care       Total time for H and P, chart review, counseling,  and generating customized AVS unique to this office visit / same day charting = 30 min attempting to explain to him the background of the pna/ empyema and what we've attemptd to do to date to which he  responded don't know anything about what your saying, did nt remember admits or chest tube on R  and then left s getting any fo the f/u studies I recommended.

## 2024-01-31 NOTE — Assessment & Plan Note (Addendum)
 See admit  08/03/20 assoc with severe dental deterioration chronically > left AMA  - ? New onset hemoptysis summer 2025 streaky /intermittent  -  01/05/2024   rec augmentin  x 10 d > refused f/u labs/ cxr 01/31/2024 so pulmonary f/u is prn

## 2024-01-31 NOTE — Patient Instructions (Signed)
 Try to stop smoking before smoking stops you   Apply baba powder to rectal area twice daily after first washing with mild soap and water  and drying thoroughly   Please remember to go to the lab department   for your tests - we will call you with the results when they are available.      Please remember to go to the  x-ray department  @  [facility] for your tests - we will call you with the results when they are available      Follow up here will depend on results of the above studies

## 2024-02-08 ENCOUNTER — Encounter: Payer: Self-pay | Admitting: Gastroenterology

## 2024-02-19 ENCOUNTER — Ambulatory Visit
Admission: EM | Admit: 2024-02-19 | Discharge: 2024-02-19 | Disposition: A | Discharge status: Home / Self Care | Attending: Internal Medicine | Admitting: Internal Medicine

## 2024-02-19 ENCOUNTER — Encounter: Payer: Self-pay | Admitting: Emergency Medicine

## 2024-02-19 DIAGNOSIS — R21 Rash and other nonspecific skin eruption: Secondary | ICD-10-CM | POA: Diagnosis not present

## 2024-02-19 DIAGNOSIS — L282 Other prurigo: Secondary | ICD-10-CM

## 2024-02-19 DIAGNOSIS — B356 Tinea cruris: Secondary | ICD-10-CM | POA: Diagnosis not present

## 2024-02-19 MED ORDER — FLUCONAZOLE 150 MG PO TABS: 150.0000 mg | ORAL_TABLET | ORAL | 0 refills | Status: AC

## 2024-02-19 MED ORDER — CLOTRIMAZOLE 1 % EX CREA: TOPICAL_CREAM | CUTANEOUS | 0 refills | Status: AC

## 2024-02-19 MED ORDER — CEPHALEXIN 500 MG PO CAPS: 500.0000 mg | ORAL_CAPSULE | Freq: Three times a day (TID) | ORAL | 0 refills | Status: AC

## 2024-02-19 NOTE — ED Provider Notes (Addendum)
 [facility]    CSN: [id number] Arrival date & time: 02/19/24  1440      History   Chief Complaint No chief complaint on file.   HPI Derrick Beltran is a 63 y.o. male.   Derrick Beltran is a 63 y.o. male presenting for chief complaint of itching to the buttocks that started several months ago and has worsened over the last 1 to 2 weeks. He additionally reports itchy rash to the bilateral legs. He has been treating the itchy rash to his buttocks with hemorrhoid cream and has gone through 2-3 tubes of hemorrhoid cream in the last 2 months. He has now noted some open sores and pain to the buttock rash.  Denies recent fever, chills, nausea, vomiting, and bodyaches.  Denies recent antibiotic or oral steroid use. Denies history of immunosuppression.  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 He has never had a herpes outbreak confirmed with HSV swab of the rash with culture and typing. He has only been sexually active with his wife of 43 years and denies other potential exposures to HSV.      Past Medical History:  Diagnosis Date   Empyema (HCC) 03/03/2018; 03/14/2018   GERD (gastroesophageal reflux disease)     Patient Active Problem List   Diagnosis Date Noted   Poor dentition 01/08/2024   DOE (dyspnea on exertion) 01/05/2024   Multifocal pneumonia 08/02/2020   Unintentional weight loss 08/02/2020   Sepsis (HCC) 08/02/2020   Acute metabolic encephalopathy 08/02/2020   AKI (acute kidney injury) 08/02/2020   Leukocytosis 08/02/2020   Necrotizing pneumonia (HCC) 05/02/2018   Malnutrition of moderate degree 03/17/2018   Hyponatremia 03/04/2018   Pulmonary nodule 03/04/2018   Aortic atherosclerosis 03/04/2018   Cigarette smoker 03/04/2018   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 Authorizing Provider  cephALEXin (KEFLEX) 500 MG capsule Take 1 capsule (500 mg total) by mouth 3 (three) times daily for 7 days. 02/19/24 02/26/24 Yes StanhopeDorna HERO, FNP  clotrimazole (LOTRIMIN) 1 % cream Apply to buttock rash 2 times daily for the next 14 days. 02/19/24  Yes Enedelia Dorna HERO, FNP  fluconazole (DIFLUCAN) 150 MG tablet Take 1 tablet (150 mg total) by mouth every 7 (seven) days. 02/19/24  Yes Enedelia Dorna HERO, FNP  clindamycin (CLINDAGEL) 1 % gel Apply topically 2 (two) times daily as needed. 07/26/23   [provider]  hydrocortisone  cream 1 % Apply 1 Application topically 2 (two) times daily.    [provider]  Hydrocortisone , Perianal, 1 % CREA Apply 1 application topically daily as needed (sore). 02/21/20   [provider]  lidocaine  (XYLOCAINE ) 2 % jelly as directed. 10/01/23   [provider]  pantoprazole  (PROTONIX ) 40 MG tablet TAKE 1 TABLET BY MOUTH EVERY DAY 12/13/23   Kennedy Charmaine CROME, NP  valACYclovir (VALTREX) 1000 MG tablet Take 1,000 mg by mouth daily. 06/21/23   [provider]    Family History Family History  Problem Relation Age of Onset   Alzheimer's disease Mother    Alzheimer's disease Father     Social History Social History   Tobacco Use   Smoking status: Every Day    Current packs/day:  0.50    Average packs/day: 0.5 packs/day for 45.0 years (22.5 ttl pk-yrs)    Types: Cigarettes   Smokeless tobacco: Never  Vaping Use   Vaping status: Never Used  Substance Use Topics   Alcohol use: Yes    Comment: 03/03/2018 3-4 / day and more on weekends ' 05/22/20 avg 1-2 daily   Drug use: Never     Allergies   Patient has no known allergies.   Review of Systems Review of Systems   Physical Exam Triage Vital Signs ED Triage Vitals  Encounter Vitals Group     BP 02/19/24 1457 (!) 155/81     Girls Systolic BP Percentile --      Girls Diastolic  BP Percentile --      Boys Systolic BP Percentile --      Boys Diastolic BP Percentile --      Pulse Rate 02/19/24 1457 (!) 107     Resp 02/19/24 1457 18     Temp 02/19/24 1457 97.7 F (36.5 C)     Temp Source 02/19/24 1457 Oral     SpO2 02/19/24 1457 91 %     Weight --      Height --      Head Circumference --      Peak Flow --      Pain Score 02/19/24 1459 10     Pain Loc --      Pain Education --      Exclude from Growth Chart --    No data found.  Updated Vital Signs BP (!) 155/81 (BP Location: Right Arm)   Pulse (!) 107   Temp 97.7 F (36.5 C) (Oral)   Resp 18   SpO2 91%   Visual Acuity Right Eye Distance:   Left Eye Distance:   Bilateral Distance:    Right Eye Near:   Left Eye Near:    Bilateral Near:     Physical Exam Vitals and nursing note reviewed. Exam conducted with a chaperone present Evonne, CMA present for GU exam).  Constitutional:      Appearance: He is not ill-appearing or toxic-appearing.  HENT:     Head: Normocephalic and atraumatic.     Right Ear: Hearing and external ear normal.     Left Ear: Hearing and external ear normal.     Nose: Nose normal.     Mouth/Throat:     Lips: Pink.  Eyes:     General: Lids are normal. Vision grossly intact. Gaze aligned appropriately.     Extraocular Movements: Extraocular movements intact.     Conjunctiva/sclera: Conjunctivae normal.  Pulmonary:     Effort: Pulmonary effort is normal.  Genitourinary:    Penis: Normal.       Comments: Well-demarcated area of erythema with raised maculopapular lesions to the gluteal cleft/buttock.  Signs of skin excoriation with open wounds less than 0.5 to 1 cm in diameter to the diffuse buttocks rash. Non-draining open wounds.  Musculoskeletal:     Cervical back: Neck supple.  Skin:    General: Skin is warm and dry.     Capillary Refill: Capillary refill takes less than 2 seconds.     Findings: Rash present.     Comments: Papular erythematous lesions/rash to the  bilateral lower legs, appears scabbed over and hyperpigmented.   Neurological:     General: No focal deficit present.     Mental Status: He is alert and oriented to person, place, and time. Mental status is  at baseline.     Cranial Nerves: No dysarthria or facial asymmetry.  Psychiatric:        Mood and Affect: Mood normal.        Speech: Speech normal.        Behavior: Behavior normal.        Thought Content: Thought content normal.        Judgment: Judgment normal.      UC Treatments / Results  Labs (all labs ordered are listed, but only abnormal results are displayed) Labs Reviewed  CBC  COMPREHENSIVE METABOLIC PANEL WITH GFR    EKG   Radiology No results found.  Procedures Procedures (including critical care time)  Medications Ordered in UC Medications - No data to display  Initial Impression / Assessment and Plan / UC Course  I have reviewed the triage vital signs and the nursing notes.  Pertinent labs & imaging results that were available during my care of the patient were reviewed by me and considered in my medical decision making (see chart for details).   1. Pruritic rash, tinea cruris, rash and nonspecific skin eruption GU rash consistent with tinea cruris with secondary bacterial infection. Low suspicion for HSV infection- serology testing for HSV is not a reliable way to test for HSV infection, discussed this with patient. Rash is mostly pruritic in nature, minimal tenderness to palpation of rash; therefore, HSV culture and typing is not performed today.   Steroid hemorrhoid cream is likely worsening fungal rash.  We will attempt treatment with clotrimazole cream BID for 14 days to buttock rash.  Keflex TID for 7 days ordered to treat secondary bacterial infection to rash as a result of skin excoriation from itching.   Fluconazole 150mg  2 doses (today, then again in 7 days) ordered to further treat what appears to be fungal rash.   It's unclear what's  causing patient's leg rash.  Basic labs drawn to evaluate for further causes of pruritus such as uremic pruritus/hepatic dysfunction.   Recommend follow-up with PCP in the next 4-5 days for re-check to ensure rash is healing.   Counseled patient on potential for adverse effects with medications prescribed/recommended today, strict ER and return-to-clinic precautions discussed, patient verbalized understanding.    Final Clinical Impressions(s) / UC Diagnoses   Final diagnoses:  Pruritic rash  Tinea cruris  Rash and nonspecific skin eruption     Discharge Instructions      Take fluconazole pill once today, then again in 7 days.  This will treat fungal rash.  Apply clotrimazole cream to the buttocks rash every 12 hours for the next 14 days.  Take Keflex antibiotic every 8 hours for the next 7 days to treat bacterial infection to the buttocks as a result of itching and rash.  Please schedule an appointment with your primary care provider for follow-up in the next 2 to 3 days for further and ongoing evaluation of your rash.  If you develop any new or worsening symptoms or if your symptoms do not start to improve, please return here or follow-up with your primary care provider. If your symptoms are severe, please go to the emergency room.     ED Prescriptions     Medication Sig Dispense Auth. Provider   fluconazole (DIFLUCAN) 150 MG tablet Take 1 tablet (150 mg total) by mouth every 7 (seven) days. 2 tablet Wendie Diskin M, FNP   clotrimazole (LOTRIMIN) 1 % cream Apply to buttock rash 2 times daily for the next 14  days. 15 g Jasmaine Rochel M, FNP   cephALEXin (KEFLEX) 500 MG capsule Take 1 capsule (500 mg total) by mouth 3 (three) times daily for 7 days. 21 capsule Enedelia Dorna HERO, FNP      PDMP not reviewed this encounter.     Enedelia Dorna HERO, [place] 02/19/24 2040

## 2024-02-19 NOTE — ED Triage Notes (Signed)
 Itching around bottom and itching on legs x 6 months.  States he has seen specialists and was told it was herpes States he does not agree with this.  States the areas hurt (burn)

## 2024-02-19 NOTE — Discharge Instructions (Addendum)
 Take fluconazole pill once today, then again in 7 days.  This will treat fungal rash.  Apply clotrimazole cream to the buttocks rash every 12 hours for the next 14 days.  Take Keflex antibiotic every 8 hours for the next 7 days to treat bacterial infection to the buttocks as a result of itching and rash.  Please schedule an appointment with your primary care provider for follow-up in the next 2 to 3 days for further and ongoing evaluation of your rash.  If you develop any new or worsening symptoms or if your symptoms do not start to improve, please return here or follow-up with your primary care provider. If your symptoms are severe, please go to the emergency room.

## 2024-02-24 LAB — COMPREHENSIVE METABOLIC PANEL WITH GFR
ALT: 31 IU/L (ref 0–44)
AST: 45 IU/L — ABNORMAL HIGH (ref 0–40)
Albumin: 4.2 g/dL (ref 3.9–4.9)
Alkaline Phosphatase: 128 IU/L — ABNORMAL HIGH (ref 47–123)
BUN/Creatinine Ratio: 6 — ABNORMAL LOW (ref 10–24)
BUN: 8 mg/dL (ref 8–27)
Bilirubin Total: 2 mg/dL — ABNORMAL HIGH (ref 0.0–1.2)
CO2: 18 mmol/L — ABNORMAL LOW (ref 20–29)
Calcium: 9.2 mg/dL (ref 8.6–10.2)
Chloride: 89 mmol/L — ABNORMAL LOW (ref 96–106)
Creatinine, Ser: 1.24 mg/dL (ref 0.76–1.27)
Globulin, Total: 4.3 g/dL (ref 1.5–4.5)
Glucose: 120 mg/dL — ABNORMAL HIGH (ref 70–99)
Potassium: 4.9 mmol/L (ref 3.5–5.2)
Sodium: 125 mmol/L — ABNORMAL LOW (ref 134–144)
Total Protein: 8.5 g/dL (ref 6.0–8.5)
eGFR: 65 mL/min/1.73 (ref >59)

## 2024-02-25 ENCOUNTER — Ambulatory Visit (HOSPITAL_COMMUNITY): Payer: Self-pay

## 2024-02-25 LAB — CBC
Hematocrit: 41.2 % (ref 37.5–51.0)
Hemoglobin: 13.9 g/dL (ref 13.0–17.7)
MCH: 35.2 pg — ABNORMAL HIGH (ref 26.6–33.0)
MCHC: 33.7 g/dL (ref 31.5–35.7)
MCV: 104 fL — ABNORMAL HIGH (ref 79–97)
Platelets: 322 x10E3/uL (ref 150–450)
RBC: 3.95 x10E6/uL — ABNORMAL LOW (ref 4.14–5.80)
RDW: 12.5 % (ref 11.6–15.4)
WBC: 11.4 x10E3/uL — ABNORMAL HIGH (ref 3.4–10.8)

## 2024-02-25 LAB — SPECIMEN STATUS REPORT
